# Patient Record
Sex: Female | Born: 1965 | Race: Black or African American | Hispanic: No | Marital: Married | Smoking: Never smoker
Health system: Southern US, Community
[De-identification: ages and names within clinical notes are randomized; demographics above are authoritative.]

## PROBLEM LIST (undated history)

## (undated) DIAGNOSIS — I10 Essential (primary) hypertension: Secondary | ICD-10-CM

## (undated) DIAGNOSIS — K219 Gastro-esophageal reflux disease without esophagitis: Secondary | ICD-10-CM

## (undated) DIAGNOSIS — N63 Unspecified lump in unspecified breast: Secondary | ICD-10-CM

## (undated) DIAGNOSIS — E119 Type 2 diabetes mellitus without complications: Secondary | ICD-10-CM

## (undated) DIAGNOSIS — IMO0002 Reserved for concepts with insufficient information to code with codable children: Secondary | ICD-10-CM

## (undated) DIAGNOSIS — R569 Unspecified convulsions: Secondary | ICD-10-CM

## (undated) DIAGNOSIS — R011 Cardiac murmur, unspecified: Secondary | ICD-10-CM

## (undated) HISTORY — DX: Essential (primary) hypertension: I10

## (undated) HISTORY — DX: Type 2 diabetes mellitus without complications: E11.9

## (undated) HISTORY — DX: Cardiac murmur, unspecified: R01.1

## (undated) HISTORY — DX: Unspecified lump in unspecified breast: N63.0

## (undated) HISTORY — DX: Reserved for concepts with insufficient information to code with codable children: IMO0002

## (undated) HISTORY — DX: Unspecified convulsions: R56.9

## (undated) HISTORY — DX: Gastro-esophageal reflux disease without esophagitis: K21.9

---
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
## 1991-07-08 DIAGNOSIS — I10 Essential (primary) hypertension: Secondary | ICD-10-CM

## 1991-07-08 HISTORY — DX: Essential (primary) hypertension: I10

## 1997-12-28 ENCOUNTER — Ambulatory Visit (HOSPITAL_COMMUNITY): Admission: RE | Admit: 1997-12-28 | Discharge: 1997-12-28 | Payer: Self-pay | Admitting: Urology

## 1999-09-03 ENCOUNTER — Other Ambulatory Visit: Admission: RE | Admit: 1999-09-03 | Discharge: 1999-09-03 | Payer: Self-pay | Admitting: Obstetrics and Gynecology

## 1999-10-09 ENCOUNTER — Ambulatory Visit (HOSPITAL_COMMUNITY): Admission: RE | Admit: 1999-10-09 | Discharge: 1999-10-09 | Payer: Self-pay | Admitting: Urology

## 2000-09-14 ENCOUNTER — Other Ambulatory Visit: Admission: RE | Admit: 2000-09-14 | Discharge: 2000-09-14 | Payer: Self-pay | Admitting: *Deleted

## 2001-01-13 ENCOUNTER — Encounter: Admission: RE | Admit: 2001-01-13 | Discharge: 2001-01-13 | Payer: Self-pay | Admitting: Obstetrics and Gynecology

## 2001-01-13 ENCOUNTER — Encounter: Payer: Self-pay | Admitting: Obstetrics and Gynecology

## 2001-01-13 ENCOUNTER — Other Ambulatory Visit: Admission: RE | Admit: 2001-01-13 | Discharge: 2001-01-13 | Payer: Self-pay | Admitting: Obstetrics and Gynecology

## 2001-02-03 ENCOUNTER — Encounter: Admission: RE | Admit: 2001-02-03 | Discharge: 2001-02-03 | Payer: Self-pay | Admitting: Obstetrics and Gynecology

## 2001-02-03 ENCOUNTER — Encounter: Payer: Self-pay | Admitting: Obstetrics and Gynecology

## 2001-02-04 ENCOUNTER — Encounter: Admission: RE | Admit: 2001-02-04 | Discharge: 2001-02-04 | Payer: Self-pay | Admitting: Family Medicine

## 2001-02-04 ENCOUNTER — Encounter: Payer: Self-pay | Admitting: Family Medicine

## 2001-11-25 ENCOUNTER — Other Ambulatory Visit: Admission: RE | Admit: 2001-11-25 | Discharge: 2001-11-25 | Payer: Self-pay | Admitting: Obstetrics and Gynecology

## 2001-11-30 ENCOUNTER — Ambulatory Visit (HOSPITAL_COMMUNITY): Admission: RE | Admit: 2001-11-30 | Discharge: 2001-11-30 | Payer: Self-pay | Admitting: *Deleted

## 2002-07-07 HISTORY — PX: ABDOMINAL HYSTERECTOMY: SHX81

## 2002-07-14 ENCOUNTER — Observation Stay (HOSPITAL_COMMUNITY): Admission: RE | Admit: 2002-07-14 | Discharge: 2002-07-15 | Payer: Self-pay | Admitting: Obstetrics and Gynecology

## 2002-07-18 ENCOUNTER — Encounter: Payer: Self-pay | Admitting: Emergency Medicine

## 2002-07-18 ENCOUNTER — Emergency Department (HOSPITAL_COMMUNITY): Admission: EM | Admit: 2002-07-18 | Discharge: 2002-07-18 | Payer: Self-pay | Admitting: Emergency Medicine

## 2002-07-22 ENCOUNTER — Inpatient Hospital Stay (HOSPITAL_COMMUNITY): Admission: AD | Admit: 2002-07-22 | Discharge: 2002-07-23 | Payer: Self-pay | Admitting: *Deleted

## 2002-08-06 ENCOUNTER — Inpatient Hospital Stay (HOSPITAL_COMMUNITY): Admission: AD | Admit: 2002-08-06 | Discharge: 2002-08-06 | Payer: Self-pay | Admitting: *Deleted

## 2002-08-06 ENCOUNTER — Encounter: Payer: Self-pay | Admitting: *Deleted

## 2002-09-20 ENCOUNTER — Encounter: Admission: RE | Admit: 2002-09-20 | Discharge: 2002-09-20 | Payer: Self-pay | Admitting: Obstetrics and Gynecology

## 2002-09-20 ENCOUNTER — Encounter: Payer: Self-pay | Admitting: Obstetrics and Gynecology

## 2002-11-03 ENCOUNTER — Emergency Department (HOSPITAL_COMMUNITY): Admission: EM | Admit: 2002-11-03 | Discharge: 2002-11-04 | Payer: Self-pay | Admitting: Emergency Medicine

## 2002-11-04 ENCOUNTER — Encounter: Payer: Self-pay | Admitting: Emergency Medicine

## 2003-08-08 ENCOUNTER — Encounter: Admission: RE | Admit: 2003-08-08 | Discharge: 2003-08-08 | Payer: Self-pay | Admitting: Family Medicine

## 2004-06-12 ENCOUNTER — Encounter: Admission: RE | Admit: 2004-06-12 | Discharge: 2004-06-12 | Payer: Self-pay | Admitting: Family Medicine

## 2004-08-20 ENCOUNTER — Ambulatory Visit: Payer: Self-pay

## 2006-03-17 ENCOUNTER — Ambulatory Visit: Payer: Self-pay

## 2007-10-12 ENCOUNTER — Ambulatory Visit: Payer: Self-pay | Admitting: Internal Medicine

## 2007-10-21 ENCOUNTER — Ambulatory Visit: Payer: Self-pay | Admitting: Urology

## 2008-08-30 ENCOUNTER — Ambulatory Visit: Payer: Self-pay | Admitting: Family Medicine

## 2009-04-10 ENCOUNTER — Encounter: Payer: Self-pay | Admitting: Urology

## 2009-05-07 ENCOUNTER — Encounter: Payer: Self-pay | Admitting: Urology

## 2009-06-06 ENCOUNTER — Encounter: Payer: Self-pay | Admitting: Urology

## 2009-07-24 ENCOUNTER — Encounter: Payer: Self-pay | Admitting: Obstetrics and Gynecology

## 2009-10-10 ENCOUNTER — Ambulatory Visit: Payer: Self-pay | Admitting: Family Medicine

## 2010-08-06 ENCOUNTER — Ambulatory Visit: Payer: Self-pay | Admitting: Neurology

## 2010-09-30 ENCOUNTER — Ambulatory Visit: Payer: Self-pay | Admitting: Gastroenterology

## 2010-10-02 LAB — PATHOLOGY REPORT

## 2010-10-28 ENCOUNTER — Ambulatory Visit: Payer: Self-pay | Admitting: Neurology

## 2011-04-01 ENCOUNTER — Ambulatory Visit: Payer: Self-pay | Admitting: Family Medicine

## 2011-07-08 HISTORY — PX: COLONOSCOPY: SHX174

## 2011-12-22 ENCOUNTER — Ambulatory Visit: Payer: Self-pay | Admitting: Family Medicine

## 2012-01-01 ENCOUNTER — Ambulatory Visit: Payer: Self-pay | Admitting: General Surgery

## 2012-01-01 LAB — POTASSIUM: Potassium: 3.2 mmol/L — ABNORMAL LOW (ref 3.5–5.1)

## 2012-01-05 ENCOUNTER — Ambulatory Visit: Payer: Self-pay | Admitting: General Surgery

## 2012-01-06 LAB — PATHOLOGY REPORT

## 2012-01-14 HISTORY — PX: BREAST SURGERY: SHX581

## 2012-07-07 HISTORY — PX: APPENDECTOMY: SHX54

## 2012-09-21 LAB — CBC
HCT: 40.3 % (ref 35.0–47.0)
MCH: 29.3 pg (ref 26.0–34.0)
Platelet: 343 10*3/uL (ref 150–440)
RBC: 4.63 10*6/uL (ref 3.80–5.20)
RDW: 14 % (ref 11.5–14.5)
WBC: 14.3 10*3/uL — ABNORMAL HIGH (ref 3.6–11.0)

## 2012-09-21 LAB — COMPREHENSIVE METABOLIC PANEL
Albumin: 3.5 g/dL (ref 3.4–5.0)
Alkaline Phosphatase: 84 U/L (ref 50–136)
BUN: 9 mg/dL (ref 7–18)
Calcium, Total: 8.5 mg/dL (ref 8.5–10.1)
Chloride: 102 mmol/L (ref 98–107)
Creatinine: 0.67 mg/dL (ref 0.60–1.30)
EGFR (African American): >60
EGFR (Non-African Amer.): >60
Glucose: 116 mg/dL — ABNORMAL HIGH (ref 65–99)
SGOT(AST): 19 U/L (ref 15–37)
SGPT (ALT): 28 U/L (ref 12–78)
Sodium: 137 mmol/L (ref 136–145)

## 2012-09-21 LAB — URINALYSIS, COMPLETE
Bacteria: NONE SEEN
Glucose,UR: NEGATIVE mg/dL (ref 0–75)
Ketone: NEGATIVE
Leukocyte Esterase: NEGATIVE
Protein: NEGATIVE
Specific Gravity: 1.013 (ref 1.003–1.030)
Squamous Epithelial: 2

## 2012-09-22 ENCOUNTER — Inpatient Hospital Stay: Payer: Self-pay | Admitting: Surgery

## 2012-09-23 LAB — CBC WITH DIFFERENTIAL/PLATELET
Eosinophil #: 0 10*3/uL (ref 0.0–0.7)
HCT: 34 % — ABNORMAL LOW (ref 35.0–47.0)
HGB: 11.3 g/dL — ABNORMAL LOW (ref 12.0–16.0)
MCH: 29.1 pg (ref 26.0–34.0)
MCHC: 33.3 g/dL (ref 32.0–36.0)
Monocyte #: 1.3 x10 3/mm — ABNORMAL HIGH (ref 0.2–0.9)
Neutrophil %: 70 %
Platelet: 319 10*3/uL (ref 150–440)
RBC: 3.89 10*6/uL (ref 3.80–5.20)

## 2012-09-23 LAB — BASIC METABOLIC PANEL
Anion Gap: 7 (ref 7–16)
BUN: 9 mg/dL (ref 7–18)
Calcium, Total: 8.2 mg/dL — ABNORMAL LOW (ref 8.5–10.1)
Chloride: 105 mmol/L (ref 98–107)
Co2: 28 mmol/L (ref 21–32)
EGFR (Non-African Amer.): >60
Glucose: 115 mg/dL — ABNORMAL HIGH (ref 65–99)
Osmolality: 279 (ref 275–301)

## 2012-09-23 LAB — PATHOLOGY REPORT

## 2012-09-25 LAB — CBC WITH DIFFERENTIAL/PLATELET
Basophil %: 1.2 %
HCT: 37.1 % (ref 35.0–47.0)
HGB: 12.2 g/dL (ref 12.0–16.0)
Lymphocyte %: 39.7 %
MCH: 28.4 pg (ref 26.0–34.0)
MCHC: 32.9 g/dL (ref 32.0–36.0)
Monocyte #: 0.6 x10 3/mm (ref 0.2–0.9)
Monocyte %: 6.2 %
Neutrophil #: 4.7 10*3/uL (ref 1.4–6.5)
Neutrophil %: 49.9 %
Platelet: 329 10*3/uL (ref 150–440)
RBC: 4.29 10*6/uL (ref 3.80–5.20)
WBC: 9.5 10*3/uL (ref 3.6–11.0)

## 2012-09-25 LAB — BASIC METABOLIC PANEL
Calcium, Total: 8.8 mg/dL (ref 8.5–10.1)
Chloride: 102 mmol/L (ref 98–107)
Creatinine: 0.54 mg/dL — ABNORMAL LOW (ref 0.60–1.30)
EGFR (African American): >60
EGFR (Non-African Amer.): >60
Glucose: 110 mg/dL — ABNORMAL HIGH (ref 65–99)
Potassium: 3.5 mmol/L (ref 3.5–5.1)

## 2012-10-05 ENCOUNTER — Other Ambulatory Visit: Payer: Self-pay | Admitting: Surgery

## 2012-10-05 LAB — CLOSTRIDIUM DIFFICILE BY PCR

## 2012-12-28 ENCOUNTER — Ambulatory Visit: Payer: Self-pay | Admitting: General Surgery

## 2012-12-30 ENCOUNTER — Encounter: Payer: Self-pay | Admitting: General Surgery

## 2013-01-06 ENCOUNTER — Encounter: Payer: Self-pay | Admitting: General Surgery

## 2013-01-06 ENCOUNTER — Ambulatory Visit (INDEPENDENT_AMBULATORY_CARE_PROVIDER_SITE_OTHER): Payer: BC Managed Care – PPO | Admitting: General Surgery

## 2013-01-06 VITALS — BP 140/78 | HR 74 | Resp 12 | Ht 64.5 in | Wt 198.0 lb

## 2013-01-06 DIAGNOSIS — Z87898 Personal history of other specified conditions: Secondary | ICD-10-CM | POA: Insufficient documentation

## 2013-01-06 DIAGNOSIS — N6459 Other signs and symptoms in breast: Secondary | ICD-10-CM

## 2013-01-06 DIAGNOSIS — Z803 Family history of malignant neoplasm of breast: Secondary | ICD-10-CM | POA: Insufficient documentation

## 2013-01-06 NOTE — Patient Instructions (Addendum)
Patient to return in 1 year with a bilateral screening mammogram. 

## 2013-01-06 NOTE — Progress Notes (Signed)
Patient ID: Kerry Webb, female   DOB: [birth year], 47 y.o.   MRN: [id number]  Chief Complaint  Patient presents with  . Follow-up    mammogram    HPI Kerry Webb is a 47 y.o. female who presents for a breast evaluation. The most recent mammogram was done on 12/28/12 with a birad category 2. Patient does perform regular self breast checks and gets regular mammograms done.  No problems with her breasts at this time. No left breast nipple discharge.   HPI  Past Medical History  Diagnosis Date  . Hypertension 1993  . Seizures 1983  . Ulcer 1985  . Heart murmur 1967  . Lump or mass in breast     Left  . GERD (gastroesophageal reflux disease)     Past Surgical History  Procedure Laterality Date  . Breast surgery Left 01/14/12  . Abdominal hysterectomy  2004  . Colonoscopy  2013    Dr. Bluford Kaufmann  . Appendectomy  2014    Family History  Problem Relation Age of Onset  . Cancer Maternal Grandmother     breast    Social History History  Substance Use Topics  . Smoking status: Never Smoker   . Smokeless tobacco: Never Used  . Alcohol Use: No    Allergies  Allergen Reactions  . Shellfish Allergy Shortness Of Breath  . Asa (Aspirin)     Intestinal bleeding    Current Outpatient Prescriptions  Medication Sig Dispense Refill  . AmLODIPine Besylate (NORVASC PO) Take 1 tablet by mouth daily.      . LamoTRIgine (LAMICTAL PO) Take 1 tablet by mouth daily.      . Omeprazole (PRILOSEC PO) Take 1 capsule by mouth daily.      Marland Kitchen POTASSIUM CHLORIDE PO Take 1 tablet by mouth daily.      . Sertraline HCl (ZOLOFT PO) Take 1 tablet by mouth daily.      . Telmisartan (MICARDIS PO) Take 1 tablet by mouth daily.       No current facility-administered medications for this visit.    Review of Systems Review of Systems  Constitutional: Negative.   Respiratory: Negative.   Cardiovascular: Negative.     Blood pressure 140/78, pulse 74, resp. rate 12, height 5' 4.5" (1.638 m),  weight 198 lb (89.812 kg).  Physical Exam Physical Exam  Constitutional: She is oriented to person, place, and time. She appears well-developed and well-nourished.  Neck: Trachea normal. No mass and no thyromegaly present.  Cardiovascular: Normal rate and regular rhythm.   Murmur heard.  Systolic murmur is present with a grade of 2/6  Murmur is unchanged from before.   Pulmonary/Chest: Effort normal and breath sounds normal. Right breast exhibits no inverted nipple, no mass, no nipple discharge, no skin change and no tenderness. Left breast exhibits no inverted nipple, no mass, no nipple discharge, no skin change and no tenderness. Breasts are symmetrical.  Lymphadenopathy:    She has no cervical adenopathy.    She has no axillary adenopathy.  Neurological: She is alert and oriented to person, place, and time.  Skin: Skin is warm and dry.    Data Reviewed  Mammogram reviewed.   Assessment    Nipple discharged resolved.      Plan    Routine yearly f/u        SANKAR,SEEPLAPUTHUR G 01/06/2013, 12:58 PM

## 2013-03-16 ENCOUNTER — Emergency Department: Payer: Self-pay | Admitting: Emergency Medicine

## 2013-03-16 LAB — COMPREHENSIVE METABOLIC PANEL
Albumin: 3.6 g/dL (ref 3.4–5.0)
Alkaline Phosphatase: 97 U/L (ref 50–136)
Anion Gap: 5 — ABNORMAL LOW (ref 7–16)
BUN: 14 mg/dL (ref 7–18)
Calcium, Total: 8.9 mg/dL (ref 8.5–10.1)
Chloride: 106 mmol/L (ref 98–107)
Co2: 26 mmol/L (ref 21–32)
Creatinine: 0.51 mg/dL — ABNORMAL LOW (ref 0.60–1.30)
EGFR (African American): >60
EGFR (Non-African Amer.): >60
Osmolality: 273 (ref 275–301)
Potassium: 4.7 mmol/L (ref 3.5–5.1)
SGOT(AST): 31 U/L (ref 15–37)
SGPT (ALT): 22 U/L (ref 12–78)
Total Protein: 7.5 g/dL (ref 6.4–8.2)

## 2013-03-16 LAB — CBC WITH DIFFERENTIAL/PLATELET
Basophil #: 0.1 10*3/uL (ref 0.0–0.1)
Eosinophil #: 0.2 10*3/uL (ref 0.0–0.7)
Eosinophil %: 1.8 %
HCT: 40 % (ref 35.0–47.0)
HGB: 13.6 g/dL (ref 12.0–16.0)
Lymphocyte #: 4.9 10*3/uL — ABNORMAL HIGH (ref 1.0–3.6)
MCV: 87 fL (ref 80–100)
Monocyte %: 6.4 %
Neutrophil %: 38.5 %
Platelet: 284 10*3/uL (ref 150–440)
RBC: 4.61 10*6/uL (ref 3.80–5.20)

## 2014-01-12 ENCOUNTER — Encounter: Payer: Self-pay | Admitting: General Surgery

## 2014-01-23 ENCOUNTER — Encounter: Payer: Self-pay | Admitting: General Surgery

## 2014-01-23 ENCOUNTER — Other Ambulatory Visit: Payer: BC Managed Care – PPO

## 2014-01-23 ENCOUNTER — Ambulatory Visit (INDEPENDENT_AMBULATORY_CARE_PROVIDER_SITE_OTHER): Payer: BC Managed Care – PPO | Admitting: General Surgery

## 2014-01-23 VITALS — BP 122/76 | HR 76 | Resp 12 | Ht 64.0 in | Wt 171.0 lb

## 2014-01-23 DIAGNOSIS — N63 Unspecified lump in unspecified breast: Secondary | ICD-10-CM

## 2014-01-23 DIAGNOSIS — Z803 Family history of malignant neoplasm of breast: Secondary | ICD-10-CM

## 2014-01-23 HISTORY — PX: BREAST BIOPSY: SHX20

## 2014-01-23 NOTE — Patient Instructions (Addendum)
Patient to return in one year bilateral screening mammogram.     CARE AFTER BREAST BIOPSY  1. Leave the dressing on that your doctor applied after surgery. It is waterproof. You may bathe, shower and/or swim. The dressing will probably remain intact until your return office visit. If the dressing comes off, you will see small strips of tape against your skin on the incision. Do not remove these strips.  2. You may want to use a gauze,cloth or similar protection in your bra to prevent rubbing against your dressing and incision. This is not necessary, but you may feel more comfortable doing so.  3. It is recommended that you wear a bra day and night to give support to the breast. This will prevent the weight of the breast from pulling on the incision.  4. Your breast will feel hard and lumpy under the incision. Do not be alarmed. This is the underlying stitching of tissue. Softening of this tissue will occur in time.  5. Make sure you call the office and schedule an appointment in one week after your surgery. The office phone number is [phone]. [facility] may have already done this for you.  6. You will notice about a week after your office visit that the strips of the tape on your incision will begin to loosen. These may then be removed.  7. Report to your doctor any of the following:  * Severe pain not relieved by your pain medication  *Redness of the incision  * Drainage from the incision  *Fever greater than 101 degrees

## 2014-01-23 NOTE — Progress Notes (Signed)
Patient ID: Kerry Webb, female   DOB: 05-17-1966, 48 y.o.   MRN: [id number]  Chief Complaint  Patient presents with  . Follow-up    mammogram    HPI Kerry Webb is a 48 y.o. female who presents for a breast evaluation. The most recent mammogram was done on 01/11/14. No nipple discharge.  Patient does perform regular self breast checks and gets regular mammograms done.    HPI  Past Medical History  Diagnosis Date  . Hypertension 1993  . Seizures 1983  . Ulcer 1985  . Heart murmur 1967  . Lump or mass in breast     Left  . GERD (gastroesophageal reflux disease)     Past Surgical History  Procedure Laterality Date  . Breast surgery Left 01/14/12  . Abdominal hysterectomy  2004  . Colonoscopy  2013    Dr. Candace Cruise  . Appendectomy  2014    Family History  Problem Relation Age of Onset  . Cancer Maternal Grandmother     breast    Social History History  Substance Use Topics  . Smoking status: Never Smoker   . Smokeless tobacco: Never Used  . Alcohol Use: No    Allergies  Allergen Reactions  . Shellfish Allergy Shortness Of Breath  . Asa [Aspirin]     Intestinal bleeding    Current Outpatient Prescriptions  Medication Sig Dispense Refill  . AmLODIPine Besylate (NORVASC PO) Take 1 tablet by mouth daily.      . LamoTRIgine (LAMICTAL PO) Take 1 tablet by mouth daily.      Marland Kitchen losartan-hydrochlorothiazide (HYZAAR) 100-25 MG per tablet TAKE ONE TABLET BY MOUTH EVERY DAY      . Omeprazole (PRILOSEC PO) Take 1 capsule by mouth daily.      Marland Kitchen POTASSIUM CHLORIDE PO Take 1 tablet by mouth daily.      . potassium chloride SA (K-DUR,KLOR-CON) 20 MEQ tablet TAKE ONE TABLET BY MOUTH TWICE DAILY      . Sertraline HCl (ZOLOFT PO) Take 1 tablet by mouth daily.      . Telmisartan (MICARDIS PO) Take 1 tablet by mouth daily.       No current facility-administered medications for this visit.    Review of Systems Review of Systems  Constitutional: Negative.   Respiratory:  Negative.   Cardiovascular: Negative.     Blood pressure 122/76, pulse 76, resp. rate 12, height 5\' 4"  (1.626 m), weight 171 lb (77.565 kg).  Physical Exam Physical Exam  Constitutional: She is oriented to person, place, and time. She appears well-developed and well-nourished.  Eyes: Conjunctivae are normal. No scleral icterus.  Neck: Neck supple.  Cardiovascular: Regular rhythm.   Murmur heard.  Systolic murmur is present with a grade of 2/6  Pulmonary/Chest: Effort normal and breath sounds normal. Right breast exhibits no inverted nipple, no mass, no nipple discharge, no skin change and no tenderness. Left breast exhibits no inverted nipple, no mass, no nipple discharge, no skin change and no tenderness.  1 cm thickening in the right breast above the areolar at 12 o'clock  Abdominal: Soft. Bowel sounds are normal. There is no tenderness.  Lymphadenopathy:    She has no cervical adenopathy.    She has no axillary adenopathy.  Neurological: She is alert and oriented to person, place, and time.  Skin: Skin is warm and dry.    Data Reviewed Mammogram reviewed-no findings  Assessment    Performed right breast ultrasound Hypoechoic area with no other features  over palpable mass right breast.     Plan   Core biopsy of right breast mass discussed and completed today with pt consent Patient to return in 3 mos if pathology is benign.        Derell Bruun G 01/24/2014, 3:03 PM

## 2014-01-24 ENCOUNTER — Encounter: Payer: Self-pay | Admitting: General Surgery

## 2014-01-26 ENCOUNTER — Telehealth: Payer: Self-pay | Admitting: *Deleted

## 2014-01-26 LAB — PATHOLOGY

## 2014-01-26 NOTE — Telephone Encounter (Signed)
Notified patient as instructed, benign breast biopsy per Dr. Jamal Collin, patient pleased. Discussed follow-up appointments, patient agrees.

## 2014-03-10 ENCOUNTER — Telehealth: Payer: Self-pay | Admitting: General Surgery

## 2014-03-16 NOTE — Telephone Encounter (Signed)
ERROR

## 2014-04-26 ENCOUNTER — Ambulatory Visit: Payer: BC Managed Care – PPO | Admitting: General Surgery

## 2014-05-08 ENCOUNTER — Encounter: Payer: Self-pay | Admitting: General Surgery

## 2014-05-09 ENCOUNTER — Other Ambulatory Visit: Payer: BC Managed Care – PPO

## 2014-05-09 ENCOUNTER — Encounter: Payer: Self-pay | Admitting: General Surgery

## 2014-05-09 ENCOUNTER — Ambulatory Visit (INDEPENDENT_AMBULATORY_CARE_PROVIDER_SITE_OTHER): Payer: BC Managed Care – PPO | Admitting: General Surgery

## 2014-05-09 VITALS — BP 124/72 | HR 70 | Resp 12 | Ht 64.0 in | Wt 164.0 lb

## 2014-05-09 DIAGNOSIS — N6310 Unspecified lump in the right breast, unspecified quadrant: Secondary | ICD-10-CM

## 2014-05-09 DIAGNOSIS — N63 Unspecified lump in breast: Secondary | ICD-10-CM

## 2014-05-09 NOTE — Patient Instructions (Addendum)
Continue self breast exams. Call office for any new breast issues or concerns.  Patient is scheduled for surgery at [facility] on 05/16/14. She will pre admit by phone on 05/10/14. She is aware of dates and instructions.

## 2014-05-09 NOTE — Progress Notes (Signed)
Patient ID: Kerry Webb, female   DOB: May 14, 1966, 48 y.o.   MRN: [id number]  Chief Complaint  Patient presents with  . Follow-up    HPI Kerry Webb is a 48 y.o. female.  Here today for follow up right breast mass and right breast ultrasound. Right breast core biopsy was benign on 01-23-14. No new breast issues.  HPI  Past Medical History  Diagnosis Date  . Hypertension 1993  . Seizures 1983  . Ulcer 1985  . Heart murmur 1967  . Lump or mass in breast     Left  . GERD (gastroesophageal reflux disease)     Past Surgical History  Procedure Laterality Date  . Abdominal hysterectomy  2004  . Colonoscopy  2013    Dr. Candace Cruise  . Appendectomy  2014  . Breast surgery Left 01/14/12  . Breast biopsy Right 01-23-14    benign    Family History  Problem Relation Age of Onset  . Cancer Maternal Grandmother     breast    Social History History  Substance Use Topics  . Smoking status: Never Smoker   . Smokeless tobacco: Never Used  . Alcohol Use: No    Allergies  Allergen Reactions  . Shellfish Allergy Shortness Of Breath  . Asa [Aspirin]     Intestinal bleeding    Current Outpatient Prescriptions  Medication Sig Dispense Refill  . AmLODIPine Besylate (NORVASC PO) Take 1 tablet by mouth daily.    . LamoTRIgine (LAMICTAL PO) Take 1 tablet by mouth daily.    Marland Kitchen losartan-hydrochlorothiazide (HYZAAR) 100-25 MG per tablet TAKE ONE TABLET BY MOUTH EVERY DAY    . Omeprazole (PRILOSEC PO) Take 1 capsule by mouth daily.    . potassium chloride SA (K-DUR,KLOR-CON) 20 MEQ tablet TAKE ONE TABLET BY MOUTH TWICE DAILY    . Sertraline HCl (ZOLOFT PO) Take 1 tablet by mouth daily.    . Telmisartan (MICARDIS PO) Take 1 tablet by mouth daily.     No current facility-administered medications for this visit.    Review of Systems Review of Systems  Constitutional: Negative.   Respiratory: Negative.   Cardiovascular: Negative.     Blood pressure 124/72, pulse 70, resp. rate  12, height 5\' 4"  (1.626 m), weight 164 lb (74.39 kg).  Physical Exam Physical Exam  Constitutional: She is oriented to person, place, and time. She appears well-developed and well-nourished.  Eyes: Conjunctivae are normal. No scleral icterus.  Neck: Neck supple.  Cardiovascular: Normal rate and regular rhythm.   Murmur heard.  Systolic murmur is present with a grade of 2/6  Pulmonary/Chest: Effort normal and breath sounds normal. Right breast exhibits no inverted nipple, no mass, no nipple discharge, no skin change and no tenderness. Left breast exhibits no inverted nipple, no mass, no nipple discharge, no skin change and no tenderness.  Palpable thickening above areolar right breast.  Lymphadenopathy:    She has no cervical adenopathy.    She has no axillary adenopathy.  Neurological: She is alert and oriented to person, place, and time.  Skin: Skin is warm and dry.    Data Reviewed Prior office [place]  Assessment    Right breast ultrasound showed basically the same size mass above right areola. Prior path only showed benign breast tissue. This is considered a discordant diagnosis. Discussed options of excision vs continued observation. Pt wishes to go ahead with excision. Procedure explained to pt.    Plan    Schedule excision right breast mass  in SDS     Patient is scheduled for surgery at [facility] on 05/16/14. She will pre admit by phone on 05/10/14. She is aware of dates and instructions.    Nevaan Bunton G 05/09/2014, 8:11 PM

## 2014-05-11 ENCOUNTER — Ambulatory Visit: Payer: Self-pay | Admitting: Unknown Physician Specialty

## 2014-05-11 LAB — POTASSIUM: POTASSIUM: 3.1 mmol/L — AB (ref 3.5–5.1)

## 2014-05-15 ENCOUNTER — Telehealth: Payer: Self-pay | Admitting: *Deleted

## 2014-05-15 NOTE — Telephone Encounter (Signed)
Patient notified as instructed and she verbalizes understanding.

## 2014-05-15 NOTE — Telephone Encounter (Signed)
Per the Pre-admission Department, patient's potassium level was low on 05-11-14. She is scheduled for surgery at [facility] on 05-16-14. Message left for patient to call the office.  Patient already takes K-Due 20 mEq bid at home. She will need to take 3 times today and then once at 6 am tomorrow morning.

## 2014-05-16 ENCOUNTER — Encounter: Payer: Self-pay | Admitting: General Surgery

## 2014-05-16 ENCOUNTER — Ambulatory Visit: Payer: Self-pay | Admitting: General Surgery

## 2014-05-16 DIAGNOSIS — N63 Unspecified lump in breast: Secondary | ICD-10-CM

## 2014-05-16 HISTORY — PX: BREAST SURGERY: SHX581

## 2014-05-18 ENCOUNTER — Encounter: Payer: Self-pay | Admitting: General Surgery

## 2014-05-18 ENCOUNTER — Telehealth: Payer: Self-pay | Admitting: *Deleted

## 2014-05-18 NOTE — Telephone Encounter (Signed)
-----   Message from Christene Lye, MD sent at 05/18/2014  2:03 PM EST ----- Please let pt pt know the pathology was normal.

## 2014-05-18 NOTE — Telephone Encounter (Signed)
Notified patient as instructed, patient pleased. Discussed follow-up appointments, patient agrees  

## 2014-05-25 ENCOUNTER — Ambulatory Visit (INDEPENDENT_AMBULATORY_CARE_PROVIDER_SITE_OTHER): Payer: Self-pay | Admitting: General Surgery

## 2014-05-25 ENCOUNTER — Encounter: Payer: Self-pay | Admitting: General Surgery

## 2014-05-25 VITALS — BP 110/82 | HR 80 | Resp 12 | Ht 65.0 in | Wt 167.0 lb

## 2014-05-25 DIAGNOSIS — D241 Benign neoplasm of right breast: Secondary | ICD-10-CM

## 2014-05-25 NOTE — Progress Notes (Signed)
Here today for her postoperative visit, right breast mass excision on 05-16-14, benign. She states she is doing well. Pathology was benign. Incision healing well. Follow up 8 months bilateral screening mammogram and office visit.

## 2014-05-25 NOTE — Patient Instructions (Addendum)
Continue self breast exams. Call office for any new breast issues or concerns. Follow up 8 months bilateral screening mammogram and office visit.

## 2014-05-26 ENCOUNTER — Encounter: Payer: Self-pay | Admitting: General Surgery

## 2014-10-27 NOTE — H&P (Signed)
   Subjective/Chief Complaint Abdominal pain   History of Present Illness Kerry Webb is a pleasant 49 yo F with a history of endometriosis and hysterectomy who presents with 3 weeks of intermittent abdominal pain.  Says that it began suddenly 3 weeks ago.  Diffuse, nonfocal.  Associated with bloating.  + diarrhea initially.  Thought to be colitis and treated with antibiotics.  Improved initially but no longer.  + nausea.  Felt like "her stomach was sour".  Unsure whether her pain is worse with movement or if still.   Past History HTN Seizure D/o Endometriosis s/p laparoscopy Fibroids s/p hysterectomy H/o breast duct removal for bloody discharge   Past Med/Surgical Hx:  Seizure disorder: Last 2011  Migraine Headaches:   Hypertension:   Asthma Exercise Induced:   Hysterectomy:   ALLERGIES:  Shellfish: Other, Swelling  Aspirin: Other  NSAIDS: Other  Family and Social History:  Family History Hypertension   Social History negative tobacco, negative ETOH, negative Illicit drugs   Place of Living Home  Here with her husband, who is a Engineer, structural   Review of Systems:  Subjective/Chief Complaint Abdominal pain   Fever/Chills No   Cough No   Sputum No   Abdominal Pain Yes   Diarrhea Yes   Constipation No   Nausea/Vomiting Yes   SOB/DOE No   Dysuria No   Physical Exam:  GEN well developed, well nourished, no acute distress, Appears uncomfortable   HEENT pink conjunctivae, PERRL, hearing intact to voice   RESP normal resp effort  clear BS  no use of accessory muscles   CARD regular rate  no murmur  no thrills  No LE edema  no JVD  no Rub   ABD positive tenderness  no hernia  soft  distended  normal BS   EXTR negative cyanosis/clubbing, negative edema   SKIN normal to palpation, No rashes, No ulcers   NEURO cranial nerves intact, negative rigidity, negative tremor   PSYCH alert, A+O to time, place, person, good insight    Assessment/Admission Diagnosis  Ms. [name] is a pleasant 49 yo F with 3 weeks of nonresolving abdominal pain.  + WBC 14.  Tachycardic.  Tender to palpation.  CT shows dilated appendix with some periappendiceal stranding/fecoliths.  Unusual presentation for appendicitis, consider perforated.  Also in differential is diverticulitis vs endometriosis.   Plan Will admit for resuscitation, abx, correction of electrolytes.  Will hand off care to Dr. Pat Patrick in am, who will likely proceed with appendectomy vs CT with contrast.   Electronic Signatures: Floyde Parkins (MD)  (Signed 19-Mar-14 04:22)  Authored: CHIEF COMPLAINT and HISTORY, PAST MEDICAL/SURGIAL HISTORY, ALLERGIES, FAMILY AND SOCIAL HISTORY, REVIEW OF SYSTEMS, PHYSICAL EXAM, ASSESSMENT AND PLAN   Last Updated: 19-Mar-14 04:22 by Floyde Parkins (MD)

## 2014-10-27 NOTE — Op Note (Signed)
PATIENT NAME:  Kerry Webb, Kerry Webb MR#:  [id number] DATE OF BIRTH:  Aug 04, 1965  DATE OF PROCEDURE:  09/22/2012  PREOPERATIVE DIAGNOSIS: Acute appendicitis.   POSTOPERATIVE DIAGNOSIS: Acute appendicitis.   OPERATION: Laparoscopic appendectomy.   ANESTHESIA:  General.  SURGEON: Rodena Goldmann, M.D.   ASSISTANTAnastasia Fiedler, [place] student.   OPERATIVE PROCEDURE: With the patient in the supine position after the induction of appropriate general anesthesia, the patient's abdomen was prepped with ChloraPrep and draped with sterile towels. The patient was placed in the head-down, feet-up position. A small infraumbilical incision was made in the standard fashion, carried down bluntly through the subcutaneous tissue. The Veress needle was used to cannulate the peritoneal cavity. CO2 was insufflated to appropriate pressure measurements. When approximately 10.5 liters of CO2 were instilled, the Veress needle was withdrawn. An 11 mm [employer] port was inserted into the peritoneal cavity. Intraperitoneal position was confirmed. CO2 was reinsufflated. A mid-epigastric transverse incision was made and an 11 mm port was inserted under direct vision. The right lower quadrant was investigated and acute appendicitis without rupture was identified. There was significant fibrinous exudate over the appendix, and the appendix appeared to be quite dilated. A suprapubic transverse incision was made and a 12 mm port inserted under direct vision. The camera was moved to the upper port and dissection carried out through the 2 lower ports. The mesoappendix was divided with 2 applications of the Endo GIA stapler carrying a white load, and the base of the appendix was amputated with a portion of the cecum using  2 applications of the Endo GIA stapler carrying a blue load. The appendix was captured in an EndoCatch apparatus and removed without difficulty through the suprapubic incision. The area was copiously irrigated with warm saline  solution. The abdomen was then suctioned without difficulty. The lower midline incision was closed with figure-of-eight suture of 0 Vicryl using the suture passer. Skin closed with 5-0 nylon after desufflation and the area infiltrated with 0.25% Marcaine for postoperative pain control. Sterile dressings were applied. The patient was returned to the recovery room having tolerated the procedure well. Sponge, instrument, and needle counts were correct x 2 in the operating room.    ____________________________ Rodena Goldmann III, MD rle:dm D: 09/22/2012 11:01:03 ET T: 09/22/2012 11:08:23 ET JOB#: [id number]  cc: Micheline Maze, MD, <Dictator> Youlanda Roys. Lovie Macadamia, MD  Rodena Goldmann MD ELECTRONICALLY SIGNED 09/23/2012 10:41

## 2014-10-27 NOTE — Discharge Summary (Signed)
PATIENT NAME:  Kerry Webb, ROCHETTE MR#:  [id number] DATE OF BIRTH:  [birth year]  DATE OF ADMISSION:  09/24/2012 DATE OF DISCHARGE:  09/26/2012  BRIEF HISTORY: This patient is a 49 year old woman admitted through the Emergency Room with a 3-week history of abdominal pain. Workup in the Emergency Room revealed a slightly elevated white blood cell count, but CT scan demonstrated a large, distended, thickened appendix with a large fecalith and superior appendiceal inflammatory changes. The working diagnosis was acute appendicitis. Because of the severity of her symptoms, we recommended a laparoscopy. She was taken to on the OR on the morning of 09/23/2012 where she underwent a laparoscopy. At that time, she was noted to have an acutely inflamed appendix. A laparoscopic appendectomy was performed. She was maintained on antibiotic therapy. She was started on a liquid diet and slowly improved over the first 24 to 48 hours. She has had bowel function with no evidence of any significant wound problems. She had a little nausea on the first hospital day, which prompted a repeat evaluation. No significant elevation of white blood cell count was noted, and her exam was unremarkable. She is discharged home today, to be followed in the office in 7 to 10 days' time. Bathing, activity, and driving instructions were given to the patient.  She is to take Vicodin for pain.    DISCHARGE MEDICATIONS: Include: Omeprazole 20 mg p.o. daily, lamotrigine 100 mg b.i.d., sertraline 100 mg once a day, losartan 100 mg once a day, amlodipine 10 mg once a day, and hydrochlorothiazide 25 mg p.o. once a day.   FINAL DISCHARGE DIAGNOSIS: Acute appendicitis.   SURGERY: Laparoscopic appendectomy.     ____________________________ Rodena Goldmann III, MD rle:dm D: 09/26/2012 11:06:00 ET T: 09/26/2012 11:21:26 ET JOB#: [id number]  cc: Youlanda Roys. Lovie Macadamia, MD Rodena Goldmann III, MD, <Dictator>   Rodena Goldmann MD ELECTRONICALLY SIGNED 09/26/2012  17:33

## 2014-10-28 NOTE — Op Note (Signed)
PATIENT NAME:  Kerry Webb, Kerry Webb MR#:  [id number] DATE OF BIRTH:  06-14-1966  DATE OF PROCEDURE:  05/16/2014  PREOPERATIVE DIAGNOSIS: Right breast mass.   POSTOPERATIVE DIAGNOSIS: Right breast mass.   OPERATION: Excision right breast mass with ultrasound guidance and wire localization.   SURGEON: S.G. Jamal Collin, MD.   ANESTHESIA: General.   COMPLICATIONS: None.   ESTIMATED BLOOD LOSS: Minimal.   DRAINS: None.   DESCRIPTION OF PROCEDURE: The patient was placed in the supine position on the operating table and put to sleep with an LMA. The right breast was prepped and draped out as a sterile field. Timeout was performed. Ultrasound probe was brought up to the field. The slightly thickened area just above the areola around the 11 to 12 o'clock position was queried and showed an ill-defined mass at this site. A small stab incision was made lateral to the probe and a Bard UltraWire was positioned going through the mass and anchored in place. Incision was then made along the superior aspect of the breast extending from the wire entrance and corresponding to the areolar margin. The skin and subcutaneous tissue was freed and using the wire as a guide and by palpation, the firm ill-defined mass was excised out in full. Grossly it appeared to be benign and likely fibrosis. There was no suggestion of a neoplastic process on gross inspection. This excised tissue was sent to pathology for routine processing. Hemostasis was obtained with cautery and the deeper layers closed with interrupted 2-0 Vicryl and the skin closed with subcuticular 4-0 Vicryl, covered with Dermabond. The procedure was well tolerated. She was subsequently extubated and returned to the recovery room in stable condition.    ____________________________ S.Robinette Haines, MD sgs:at D: 05/16/2014 10:34:16 ET T: 05/16/2014 11:30:39 ET JOB#: [id number]  cc: S.G. Jamal Collin, MD, <Dictator> [facility] Robinette Haines MD ELECTRONICALLY SIGNED 05/18/2014  10:23

## 2014-10-29 NOTE — Op Note (Signed)
PATIENT NAME:  Kerry Webb, VAHLE MR#:  [id number] DATE OF BIRTH:  1965-12-02  DATE OF PROCEDURE:  01/05/2012  PREOPERATIVE DIAGNOSIS: Left nipple bloody discharge.   POSTOPERATIVE DIAGNOSIS: Left nipple bloody discharge.   OPERATION: Excision of subareolar duct upper outer quadrant left breast.   SURGEON: Mckinley Jewel, M.D.   ANESTHESIA: General.   COMPLICATIONS: None.   ESTIMATED BLOOD LOSS: Minimal.   DRAINS: None.   DESCRIPTION OF PROCEDURE: The patient was put to sleep with LMA. The left breast was prepped and draped out as a sterile field. Ultrasound guidance was used. The probe with a sterile cover was brought up to the field. The area of the mildly dilated ducts located in the upper outer  quadrant was identified and we mapped the incision to be along the areolar margin from the  12 o'clock to the 3 to 4 o'clock position Accordingly, this area was infiltrated with 0.5% Marcaine. A skin incision was made along the areolar margin and deepened through to reveal several ducts actually containing some milky thick material with some firmness to this tissue. The skin was elevated all the way to the nipple area and all ductal tissue in the upper outer quadrant was then excised out and sent to pathology. The deeper tissue was approximated with interrupted 2-0 Vicryl and the skin was closed with subcuticular 4-0 Vicryl and covered with Dermabond. The procedure was well tolerated. She was subsequently returned to the recovery room in stable condition.   ____________________________ S.Robinette Haines, MD sgs:bjt D:  01/05/2012 13:49:35 ET          T: 01/05/2012 14:22:31 ET         JOB#: [id number]  [id number] Robinette Haines MD ELECTRONICALLY SIGNED 01/05/2012 14:45

## 2014-10-30 LAB — SURGICAL PATHOLOGY

## 2015-01-29 ENCOUNTER — Encounter: Payer: Self-pay | Admitting: General Surgery

## 2015-02-01 ENCOUNTER — Ambulatory Visit (INDEPENDENT_AMBULATORY_CARE_PROVIDER_SITE_OTHER): Payer: BC Managed Care – PPO | Admitting: General Surgery

## 2015-02-01 ENCOUNTER — Encounter: Payer: Self-pay | Admitting: General Surgery

## 2015-02-01 VITALS — BP 118/74 | HR 78 | Resp 12 | Ht 65.0 in | Wt 169.0 lb

## 2015-02-01 DIAGNOSIS — Z803 Family history of malignant neoplasm of breast: Secondary | ICD-10-CM

## 2015-02-01 NOTE — Progress Notes (Signed)
Patient ID: Kerry Webb, female   DOB: 18-Jul-1965, 49 y.o.   MRN: [id number]  Chief Complaint  Patient presents with  . Follow-up    mammogram    HPI Kerry Webb is a 49 y.o. female who presents for a breast evaluation. The most recent mammogram was done on 01/25/15.  Patient does perform regular self breast checks and gets regular mammograms done.    HPI  Past Medical History  Diagnosis Date  . Hypertension 1993  . Seizures 1983  . Ulcer 1985  . Heart murmur 1967  . Lump or mass in breast     Left  . GERD (gastroesophageal reflux disease)     Past Surgical History  Procedure Laterality Date  . Abdominal hysterectomy  2004  . Colonoscopy  2013    Dr. Candace Cruise  . Appendectomy  2014  . Breast surgery Left 01/14/12  . Breast biopsy Right 01-23-14    benign  . Breast surgery Right 05-16-14    benign    Family History  Problem Relation Age of Onset  . Cancer Maternal Grandmother     breast    Social History History  Substance Use Topics  . Smoking status: Never Smoker   . Smokeless tobacco: Never Used  . Alcohol Use: No    Allergies  Allergen Reactions  . Shellfish Allergy Shortness Of Breath  . Asa [Aspirin]     Intestinal bleeding    Current Outpatient Prescriptions  Medication Sig Dispense Refill  . AmLODIPine Besylate (NORVASC PO) Take 1 tablet by mouth daily.    . LamoTRIgine (LAMICTAL PO) Take 1 tablet by mouth daily.    Marland Kitchen losartan-hydrochlorothiazide (HYZAAR) 100-25 MG per tablet TAKE ONE TABLET BY MOUTH EVERY DAY    . Omeprazole (PRILOSEC PO) Take 1 capsule by mouth daily.    Marland Kitchen omeprazole (PRILOSEC) 20 MG capsule Take 20 mg by mouth 2 (two) times daily before a meal.     . potassium chloride SA (K-DUR,KLOR-CON) 20 MEQ tablet 20 mEq 4 (four) times daily. TAKE ONE TABLET BY MOUTH TWICE DAILY    . Sertraline HCl (ZOLOFT PO) Take 1 tablet by mouth daily.    . Telmisartan (MICARDIS PO) Take 1 tablet by mouth daily.     No current  facility-administered medications for this visit.    Review of Systems Review of Systems  Constitutional: Negative.   Respiratory: Negative.   Cardiovascular: Negative.     Blood pressure 118/74, pulse 78, resp. rate 12, height 5\' 5"  (1.651 m), weight 169 lb (76.658 kg).  Physical Exam Physical Exam  Constitutional: She is oriented to person, place, and time. She appears well-developed and well-nourished.  Eyes: Conjunctivae are normal. No scleral icterus.  Neck: Neck supple.  Cardiovascular: Normal rate, regular rhythm and normal heart sounds.   Pulmonary/Chest: Effort normal and breath sounds normal. Right breast exhibits no inverted nipple, no mass, no nipple discharge, no skin change and no tenderness. Left breast exhibits no inverted nipple, no mass, no nipple discharge, no skin change and no tenderness.  Abdominal: Soft. Bowel sounds are normal. There is no tenderness.  Lymphadenopathy:    She has no cervical adenopathy.    She has no axillary adenopathy.  Neurological: She is alert and oriented to person, place, and time.  Skin: Skin is warm and dry.    Data Reviewed  Mammogram reviewed and stable  Assessment   1year  post right breast mass excision on 05-16-14, benign FCD Family history of  breast cancer-maternal GM     Plan        The patient has been asked to return to the office in one year with a bilateral screening mammogram. Continue self breast exams. Call office for any new breast issues or concerns.   PCP:  Maretta Bees 02/02/2015, 8:03 AM

## 2015-02-01 NOTE — Patient Instructions (Addendum)
he patient has been asked to return to the office in one year with a bilateral screening mammogram. Continue self breast exams. Call office for any new breast issues or concerns.  

## 2015-02-02 ENCOUNTER — Encounter: Payer: Self-pay | Admitting: General Surgery

## 2016-01-29 ENCOUNTER — Encounter: Payer: Self-pay | Admitting: General Surgery

## 2016-01-31 ENCOUNTER — Encounter: Payer: Self-pay | Admitting: *Deleted

## 2016-02-07 ENCOUNTER — Encounter: Payer: Self-pay | Admitting: General Surgery

## 2016-02-07 ENCOUNTER — Ambulatory Visit (INDEPENDENT_AMBULATORY_CARE_PROVIDER_SITE_OTHER): Payer: BC Managed Care – PPO | Admitting: General Surgery

## 2016-02-07 VITALS — BP 138/88 | HR 78 | Resp 12 | Ht 65.0 in | Wt 173.0 lb

## 2016-02-07 DIAGNOSIS — Z803 Family history of malignant neoplasm of breast: Secondary | ICD-10-CM | POA: Diagnosis not present

## 2016-02-07 DIAGNOSIS — D241 Benign neoplasm of right breast: Secondary | ICD-10-CM | POA: Diagnosis not present

## 2016-02-07 NOTE — Patient Instructions (Addendum)
The patient is aware to call back for any questions or concerns. Patient will be asked to return to the office in one year with a bilateral screening mammogram. 

## 2016-02-07 NOTE — Progress Notes (Signed)
Kerry Webb ID: Kerry Webb, female   DOB: [birth year], 50 y.o.   MRN: [id number]  Chief Complaint  Kerry Webb presents with  . Follow-up    mamogram    HPI Kerry Webb is a 50 y.o. female who presents for a breast evaluation. The most recent mammogram was done on 01-28-16. Kerry Webb does perform regular self breast checks and gets regular mammograms done. No new breast issues. Kerry Webb recently diagnosed with liver cancer, she lives in [place] and her step father has dementia. Kerry Webb does not have a clear idea of what the medical condition of her Kerry Webb is.  I have reviewed the history of present illness with the Kerry Webb.   HPI  Past Medical History:  Diagnosis Date  . GERD (gastroesophageal reflux disease)   . Heart murmur 1967  . Hypertension 1993  . Lump or mass in breast    Left  . Seizures ([place]) 1983  . Ulcer 1985    Past Surgical History:  Procedure Laterality Date  . ABDOMINAL HYSTERECTOMY  2004  . APPENDECTOMY  2014  . BREAST BIOPSY Right 01-23-14   benign  . BREAST SURGERY Left 01/14/12  . BREAST SURGERY Right 05-16-14   benign  . COLONOSCOPY  2013   Dr. Candace Cruise    Family History  Problem Relation Age of Onset  . Cancer Maternal Grandmother     breast  . Liver cancer Kerry Webb     Social History Social History  Substance Use Topics  . Smoking status: Never Smoker  . Smokeless tobacco: Never Used  . Alcohol use No    Allergies  Allergen Reactions  . Shellfish Allergy Shortness Of Breath  . Asa [Aspirin]     Intestinal bleeding    Current Outpatient Prescriptions  Medication Sig Dispense Refill  . AmLODIPine Besylate (NORVASC PO) Take 1 tablet by mouth daily.    . Omega-3 Fatty Acids (FISH OIL) 1000 MG CAPS Take by mouth.    . Omeprazole (PRILOSEC PO) Take 1 capsule by mouth daily.    . potassium chloride SA (K-DUR,KLOR-CON) 20 MEQ tablet 20 mEq 4 (four) times daily. TAKE ONE TABLET BY MOUTH TWICE DAILY    . Sertraline HCl (ZOLOFT PO) Take 1 tablet by  mouth daily.    Kerry Webb topiramate (TOPAMAX) 100 MG tablet Take 100 mg by mouth 2 (two) times daily.     No current facility-administered medications for this visit.     Review of Systems Review of Systems  Constitutional: Negative.   Respiratory: Negative.   Cardiovascular: Negative.     Blood pressure 138/88, pulse 78, resp. rate 12, height 5\' 5"  (1.651 m), weight 173 lb (78.5 kg).  Physical Exam Physical Exam  Constitutional: She is oriented to person, place, and time. She appears well-developed and well-nourished.  Eyes: Conjunctivae are normal. No scleral icterus.  Neck: Neck supple.  Cardiovascular: Normal rate, regular rhythm and normal heart sounds.   Pulmonary/Chest: Effort normal and breath sounds normal. Right breast exhibits no inverted nipple, no mass, no nipple discharge, no skin change and no tenderness. Left breast exhibits no inverted nipple, no mass, no nipple discharge, no skin change and no tenderness.  Abdominal: Soft. Bowel sounds are normal. There is no tenderness.  Lymphadenopathy:    She has no cervical adenopathy.    She has no axillary adenopathy.  Neurological: She is alert and oriented to person, place, and time.  Skin: Skin is warm and dry.  Psychiatric: Her behavior is normal.    Data  Reviewed Prior notes, mammogram reviewed   Assessment   2 year  post right breast mass excision on 05-16-14, benign FCD Family history of breast cancer-maternal GM       Plan    Kerry Webb will be asked to return to the office in one year with a bilateral screening mammogram.    This information has been scribed by Karie Fetch RN, BSN,BC. Kerry Webb    Kaeleb Emond G 02/07/2016, 11:58 AM

## 2017-01-30 ENCOUNTER — Encounter: Payer: Self-pay | Admitting: General Surgery

## 2017-02-05 ENCOUNTER — Ambulatory Visit (INDEPENDENT_AMBULATORY_CARE_PROVIDER_SITE_OTHER): Payer: BC Managed Care – PPO | Admitting: General Surgery

## 2017-02-05 ENCOUNTER — Encounter: Payer: Self-pay | Admitting: General Surgery

## 2017-02-05 VITALS — BP 128/64 | HR 72 | Resp 12 | Ht 64.0 in | Wt 183.0 lb

## 2017-02-05 DIAGNOSIS — D241 Benign neoplasm of right breast: Secondary | ICD-10-CM | POA: Diagnosis not present

## 2017-02-05 DIAGNOSIS — Z803 Family history of malignant neoplasm of breast: Secondary | ICD-10-CM | POA: Diagnosis not present

## 2017-02-05 NOTE — Patient Instructions (Signed)
Advised patient to continue mammogram and self breast checks. Call the office with any issues.

## 2017-02-05 NOTE — Progress Notes (Deleted)
g

## 2017-02-05 NOTE — Progress Notes (Signed)
Patient ID: Kerry Webb, female   DOB: [birth year], 51 y.o.   MRN: [id number]  Chief Complaint  Patient presents with  . Follow-up    mammogram    HPI Kerry Webb is a 51 y.o. female who presents for a breast follow up. The most recent mammogram was done on 01/29/17 at [facility]. Patient does perform regular self breast checks and gets regular mammograms done. Patient reports no new breast issues. Patient doing well.  Her mother recently passed away from liver cancer in January, she lived in [place]. Patient has gained 10lbs since her last visit-she is aware of it HPI  Past Medical History:  Diagnosis Date  . GERD (gastroesophageal reflux disease)   . Heart murmur 1967  . Hypertension 1993  . Lump or mass in breast    Left  . Seizures ([place]) 1983  . Ulcer 1985    Past Surgical History:  Procedure Laterality Date  . ABDOMINAL HYSTERECTOMY  2004  . APPENDECTOMY  2014  . BREAST BIOPSY Right 01-23-14   benign  . BREAST SURGERY Left 01/14/12  . BREAST SURGERY Right 05-16-14   benign  . COLONOSCOPY  2013   Dr. Candace Cruise    Family History  Problem Relation Age of Onset  . Cancer Maternal Grandmother        breast  . Liver cancer Mother   . Cancer Mother        liver    Social History Social History  Substance Use Topics  . Smoking status: Never Smoker  . Smokeless tobacco: Never Used  . Alcohol use No    Allergies  Allergen Reactions  . Shellfish Allergy Shortness Of Breath  . Asa [Aspirin]     Intestinal bleeding    Current Outpatient Prescriptions  Medication Sig Dispense Refill  . AmLODIPine Besylate (NORVASC PO) Take 1 tablet by mouth daily.    Marland Kitchen buPROPion (WELLBUTRIN XL) 150 MG 24 hr tablet     . Omega-3 Fatty Acids (FISH OIL) 1000 MG CAPS Take by mouth.    . Omeprazole (PRILOSEC PO) Take 1 capsule by mouth daily.    . potassium chloride SA (K-DUR,KLOR-CON) 20 MEQ tablet 20 mEq 4 (four) times daily. TAKE ONE TABLET BY MOUTH TWICE DAILY    . Sertraline HCl  (ZOLOFT PO) Take 1 tablet by mouth daily.    Marland Kitchen topiramate (TOPAMAX) 100 MG tablet Take 100 mg by mouth 2 (two) times daily.     No current facility-administered medications for this visit.     Review of Systems Review of Systems  Constitutional: Negative.   Respiratory: Negative.   Cardiovascular: Negative.     Blood pressure 128/64, pulse 72, resp. rate 12, height 5\' 4"  (1.626 m), weight 183 lb (83 kg).  Physical Exam Physical Exam  Constitutional: She is oriented to person, place, and time. She appears well-developed and well-nourished.  Eyes: Conjunctivae are normal. No scleral icterus.  Neck: Neck supple. No thyromegaly present.  Cardiovascular: Normal rate, regular rhythm and normal heart sounds.   Pulmonary/Chest: Effort normal and breath sounds normal. Right breast exhibits no inverted nipple, no mass, no nipple discharge, no skin change and no tenderness. Left breast exhibits no inverted nipple, no mass, no nipple discharge, no skin change and no tenderness.  Abdominal: Soft. Bowel sounds are normal. There is no tenderness.  Lymphadenopathy:    She has no cervical adenopathy.    She has no axillary adenopathy.  Neurological: She is alert and oriented to person,  place, and time.  Skin: Skin is warm and dry.  Psychiatric: She has a normal mood and affect.    Data Reviewed Mammogram and prior note reviewed Mammogram is stable. Assessment    History of prior breast biopsies-all benign. Remote FH of breast cancer Exam stable     Plan     Advised patient to continue mammogram and self breast checks with her PCP or her gynecologist . Call the office with any issues.        HPI, Physical Exam, Assessment and Plan have been scribed under the direction and in the presence of Mckinley Jewel, MD.  [name], CMA  I have completed the exam and reviewed the above documentation for accuracy and completeness.  I agree with the above.  Haematologist has been used  and any errors in dictation or transcription are unintentional.  Coree Brame G. Jamal Collin, M.D., F.A.C.S.    Junie Panning G 02/05/2017, 10:24 AM

## 2017-06-01 ENCOUNTER — Emergency Department: Payer: BC Managed Care – PPO

## 2017-06-01 ENCOUNTER — Other Ambulatory Visit: Payer: Self-pay

## 2017-06-01 ENCOUNTER — Emergency Department
Admission: EM | Admit: 2017-06-01 | Discharge: 2017-06-01 | Disposition: A | Discharge status: Home / Self Care | Payer: BC Managed Care – PPO | Attending: Student in an Organized Health Care Education/Training Program | Admitting: Student in an Organized Health Care Education/Training Program

## 2017-06-01 DIAGNOSIS — R569 Unspecified convulsions: Secondary | ICD-10-CM | POA: Diagnosis present

## 2017-06-01 DIAGNOSIS — Z79899 Other long term (current) drug therapy: Secondary | ICD-10-CM | POA: Insufficient documentation

## 2017-06-01 DIAGNOSIS — I10 Essential (primary) hypertension: Secondary | ICD-10-CM | POA: Insufficient documentation

## 2017-06-01 LAB — CBC WITH DIFFERENTIAL/PLATELET
BASOS ABS: 0.2 10*3/uL — AB (ref 0–0.1)
Basophils Relative: 1 %
EOS PCT: 2 %
Eosinophils Absolute: 0.3 10*3/uL (ref 0–0.7)
HEMATOCRIT: 42.5 % (ref 35.0–47.0)
Hemoglobin: 14 g/dL (ref 12.0–16.0)
LYMPHS ABS: 8.6 10*3/uL — AB (ref 1.0–3.6)
LYMPHS PCT: 57 %
MCH: 29.2 pg (ref 26.0–34.0)
MCHC: 32.9 g/dL (ref 32.0–36.0)
MCV: 88.7 fL (ref 80.0–100.0)
MONOS PCT: 7 %
Monocytes Absolute: 1.1 10*3/uL — ABNORMAL HIGH (ref 0.2–0.9)
NEUTROS ABS: 5.1 10*3/uL (ref 1.4–6.5)
Neutrophils Relative %: 33 %
Platelets: 307 10*3/uL (ref 150–440)
RBC: 4.79 MIL/uL (ref 3.80–5.20)
RDW: 13.6 % (ref 11.5–14.5)
WBC: 15.3 10*3/uL — AB (ref 3.6–11.0)

## 2017-06-01 LAB — COMPREHENSIVE METABOLIC PANEL
ALT: 27 U/L (ref 14–54)
AST: 26 U/L (ref 15–41)
Albumin: 3.7 g/dL (ref 3.5–5.0)
Alkaline Phosphatase: 132 U/L — ABNORMAL HIGH (ref 38–126)
Anion gap: 8 (ref 5–15)
BILIRUBIN TOTAL: 0.4 mg/dL (ref 0.3–1.2)
BUN: 10 mg/dL (ref 6–20)
CALCIUM: 8.9 mg/dL (ref 8.9–10.3)
CO2: 23 mmol/L (ref 22–32)
CREATININE: 0.59 mg/dL (ref 0.44–1.00)
Chloride: 107 mmol/L (ref 101–111)
Glucose, Bld: 192 mg/dL — ABNORMAL HIGH (ref 65–99)
Potassium: 2.9 mmol/L — ABNORMAL LOW (ref 3.5–5.1)
Sodium: 138 mmol/L (ref 135–145)
TOTAL PROTEIN: 7.5 g/dL (ref 6.5–8.1)

## 2017-06-01 LAB — HCG, QUANTITATIVE, PREGNANCY: HCG, BETA CHAIN, QUANT, S: 1 m[IU]/mL (ref <5)

## 2017-06-01 MED ORDER — LORAZEPAM 2 MG/ML IJ SOLN
1.0000 mg | Freq: Once | INTRAMUSCULAR | Status: AC
  Administered 2017-06-01: 1 mg via INTRAVENOUS
  Filled 2017-06-01: qty 1

## 2017-06-01 MED ORDER — AMLODIPINE BESYLATE 5 MG PO TABS
5.0000 mg | ORAL_TABLET | Freq: Once | ORAL | Status: AC
  Administered 2017-06-01: 5 mg via ORAL
  Filled 2017-06-01: qty 1

## 2017-06-01 MED ORDER — TOPIRAMATE 25 MG PO TABS
200.0000 mg | ORAL_TABLET | Freq: Every day | ORAL | Status: DC
  Administered 2017-06-01: 200 mg via ORAL
  Filled 2017-06-01: qty 8

## 2017-06-01 NOTE — ED Provider Notes (Signed)
[facility] Emergency Department Provider Note    None    (approximate)  I have reviewed the triage vital signs and the nursing notes.   HISTORY  Chief Complaint Seizures    HPI Kerry Webb is a 51 y.o. female with a history of seizure disorder on Topamax presents after having witnessed seizure like activity that occurred while she was at work.  Patient has been off of her Topamax for the past 5 days and is also been off of her home blood pressure medications.  States she is having cramping in bilateral upper extremities right now but does not have any other pain.  Denies any chest pain or shortness of breath.  Does have a mild headache.  No nausea or vomiting.  No report of trauma.  Past Medical History:  Diagnosis Date  . GERD (gastroesophageal reflux disease)   . Heart murmur 1967  . Hypertension 1993  . Lump or mass in breast    Left  . Seizures ([place]) 1983  . Ulcer 1985   Family History  Problem Relation Age of Onset  . Cancer Maternal Grandmother        breast  . Liver cancer Mother   . Cancer Mother        liver   Past Surgical History:  Procedure Laterality Date  . ABDOMINAL HYSTERECTOMY  2004  . APPENDECTOMY  2014  . BREAST BIOPSY Right 01-23-14   benign  . BREAST SURGERY Left 01/14/12  . BREAST SURGERY Right 05-16-14   benign  . COLONOSCOPY  2013   Dr. Candace Cruise   Patient Active Problem List   Diagnosis Date Noted  . Family history of breast cancer 01/06/2013  . History of nipple discharge 01/06/2013      Prior to Admission medications   Medication Sig Start Date End Date Taking? Authorizing Provider  AmLODIPine Besylate (NORVASC PO) Take 1 tablet by mouth daily.    [provider]  buPROPion (WELLBUTRIN XL) 150 MG 24 hr tablet  01/08/17   [provider]  Omega-3 Fatty Acids (FISH OIL) 1000 MG CAPS Take by mouth.    [provider]  Omeprazole (PRILOSEC PO) Take 1 capsule by mouth daily.     [provider]  potassium chloride SA (K-DUR,KLOR-CON) 20 MEQ tablet 20 mEq 4 (four) times daily. TAKE ONE TABLET BY MOUTH TWICE DAILY 01/17/14   [provider]  Sertraline HCl (ZOLOFT PO) Take 1 tablet by mouth daily.    [provider]  topiramate (TOPAMAX) 100 MG tablet Take 100 mg by mouth 2 (two) times daily.    [provider]    Allergies Shellfish allergy and Asa [aspirin]    Social History Social History   Tobacco Use  . Smoking status: Never Smoker  . Smokeless tobacco: Never Used  Substance Use Topics  . Alcohol use: No  . Drug use: No    Review of Systems Patient denies headaches, rhinorrhea, blurry vision, numbness, shortness of breath, chest pain, edema, cough, abdominal pain, nausea, vomiting, diarrhea, dysuria, fevers, rashes or hallucinations unless otherwise stated above in HPI. ____________________________________________   PHYSICAL EXAM:  VITAL SIGNS: Vitals:   06/01/17 1030  BP: (!) 217/124  Pulse: (!) 110  Resp: 20  Temp: 98.3 F (36.8 C)  SpO2: 100%    Constitutional: Alert slightly postictal in no acute distress Eyes: Conjunctivae are normal.  Head: Atraumatic. Nose: No congestion/rhinnorhea. Mouth/Throat: Mucous membranes are moist.   Neck: No stridor.  Painless ROM.  Cardiovascular: Normal rate, regular rhythm. Grossly normal heart sounds.  Good peripheral circulation. Respiratory: Normal respiratory effort.  No retractions. Lungs CTAB. Gastrointestinal: Soft and nontender. No distention. No abdominal bruits. No CVA tenderness. Genitourinary:  Musculoskeletal: No lower extremity tenderness nor edema.  No joint effusions. Neurologic:  Normal speech and language. No gross focal neurologic deficits are appreciated. No facial droop Skin:  Skin is warm, dry and intact. No rash noted. Psychiatric: Mood and affect are normal. Speech and behavior are normal.  ____________________________________________     LABS (all labs ordered are listed, but only abnormal results are displayed)  No results found for this or any previous visit (from the past 24 hour(s)). ____________________________________________  EKG My review and personal interpretation at Time: 12:01   Indication: seizure like activity  Rate: 95  Rhythm: sinus Axis: normal Other: no stemi, normal intervals, no wpw or brugada ____________________________________________  RADIOLOGY  I personally reviewed all radiographic images ordered to evaluate for the above acute complaints and reviewed radiology reports and findings.  These findings were personally discussed with the patient.  Please see medical record for radiology report.  ____________________________________________   PROCEDURES  Procedure(s) performed:  Procedures    Critical Care performed: no ____________________________________________   INITIAL IMPRESSION / ASSESSMENT AND PLAN / ED COURSE  Pertinent labs & imaging results that were available during my care of the patient were reviewed by me and considered in my medical decision making (see chart for details).  DDX: seizure, dysrhythmia, like to light abnormality.  Sepsis, unlikely encephalitis, medication noncompliance  Kerry Webb is a 51 y.o. who presents to the ED with history of seizures not taking her Topamax or blood pressure medications presents with witnessed seizure-like activity followed by postictal period as described above.  This time she has no focal neuro deficits.  Based on her market hypertension initial postictal.  Limited exam CT imaging was ordered to rule out evidence of intracranial hemorrhage.  CT imaging shows no acute abnormality.  Blood work shows mild leukocytosis likely secondary to seizure-like activity.  EKG with no signs of dysrhythmia or ischemia.  Patient feeling better and was given her Topamax.  Able to ambulate with a steady gait.  Patient stable and appropriate for  follow-up with PCP.      ____________________________________________   FINAL CLINICAL IMPRESSION(S) / ED DIAGNOSES  Final diagnoses:  Seizure-like activity ([place])      NEW MEDICATIONS STARTED DURING THIS VISIT:  This SmartLink is deprecated. Use AVSMEDLIST instead to display the medication list for a patient.   Note:  This document was prepared using Dragon voice recognition software and may include unintentional dictation errors.    Merlyn Lot, MD 06/01/17 [phone]

## 2017-06-01 NOTE — ED Triage Notes (Signed)
Possible seizure at work, history of seizures and states has been off meds x 5 days because she has been out. BP as noted, states has been off BP meds for same time. Patient is anxious, hyperventilating and jumping around on arrival. Able to slow breathing with direction.

## 2019-01-25 ENCOUNTER — Encounter: Payer: Self-pay | Admitting: *Deleted

## 2019-01-25 ENCOUNTER — Encounter: Payer: BC Managed Care – PPO | Attending: Family Medicine | Admitting: *Deleted

## 2019-01-25 ENCOUNTER — Other Ambulatory Visit: Payer: Self-pay

## 2019-01-25 VITALS — BP 158/98 | Ht 65.0 in | Wt 190.9 lb

## 2019-01-25 DIAGNOSIS — Z7984 Long term (current) use of oral hypoglycemic drugs: Secondary | ICD-10-CM | POA: Insufficient documentation

## 2019-01-25 DIAGNOSIS — E119 Type 2 diabetes mellitus without complications: Secondary | ICD-10-CM | POA: Insufficient documentation

## 2019-01-25 NOTE — Patient Instructions (Signed)
Check blood sugars 2 x day before breakfast and 2 hrs after supper every day Bring blood sugar records to the next class  Exercise: Continue walking for  30  minutes   3  days a week and gradually increase to 30 minutes 5 x week  Eat 3 meals day,   1  snack a day Space meals 4-6 hours apart  Avoid sugar sweetened drinks (juices)  Make an eye doctor appointment  Return for classes on:

## 2019-01-25 NOTE — Progress Notes (Signed)
Diabetes Self-Management Education  Visit Type: First/Initial  Appt. Start Time: 1315 Appt. End Time: 1430  01/25/2019  [name], identified by name and date of birth, is a 53 y.o. female with a diagnosis of Diabetes: Type 2.   ASSESSMENT  Blood pressure (!) 158/98, height 5\' 5"  (1.651 m), weight 190 lb 14.4 oz (86.6 kg). Body mass index is 31.77 kg/m.  Diabetes Self-Management Education - 01/25/19 1442      Visit Information   Visit Type  First/Initial      Initial Visit   Diabetes Type  Type 2    Are you currently following a meal plan?  No    Are you taking your medications as prescribed?  Yes    Date Diagnosed  3 1/2 months      Health Coping   How would you rate your overall health?  Fair      Psychosocial Assessment   Patient Belief/Attitude about Diabetes  Other (comment)   "overwhelming"   Self-care barriers  None    Self-management support  Doctor's office;Family    Patient Concerns  Nutrition/Meal planning;Medication;Healthy Lifestyle;Glycemic Control;Weight Control    Special Needs  None    Preferred Learning Style  Visual;Hands on    Learning Readiness  Change in progress    How often do you need to have someone help you when you read instructions, pamphlets, or other written materials from your doctor or pharmacy?  1 - Never    What is the last grade level you completed in school?  college      Pre-Education Assessment   Patient understands the diabetes disease and treatment process.  Needs Instruction    Patient understands incorporating nutritional management into lifestyle.  Needs Instruction    Patient undertands incorporating physical activity into lifestyle.  Needs Instruction    Patient understands using medications safely.  Needs Instruction    Patient understands monitoring blood glucose, interpreting and using results  Needs Review    Patient understands prevention, detection, and treatment of acute complications.  Needs Instruction     Patient understands prevention, detection, and treatment of chronic complications.  Needs Instruction    Patient understands how to develop strategies to address psychosocial issues.  Needs Instruction    Patient understands how to develop strategies to promote health/change behavior.  Needs Instruction      Complications   Last HgB A1C per patient/outside source  8.9 %   01/14/2019   How often do you check your blood sugar?  0 times/day (not testing)   Pt brought her own meter (One Touch Verio IQ) and was instructed on use. BG in the office was 182 mg/dL at 1:55 pm - 2 hrs pp.   Have you had a dilated eye exam in the past 12 months?  No    Have you had a dental exam in the past 12 months?  No    Are you checking your feet?  No      Dietary Intake   Breakfast  Pt eats vegetarian - egg with onions, peppers, tomatoes on tortilla; eggs, grits, toast;  fruit, peanut butter powder, chia seeds and almond milk    Snack (morning)  0-1 snack/day - popcorn, sun chips    Lunch  tuna sandwich; salmon patties with beans and macaroni salad; greens, beans and corn    Dinner  fish; bread, potatoes, beans, peas, corn, pasta, rice, broccoli, cauliflower, lettuce cabbage, greens carrots, cuccumbers    Beverage(s)  juice,  water      Exercise   Exercise Type  Light (walking / raking leaves)    How many days per week to you exercise?  3    How many minutes per day do you exercise?  30    Total minutes per week of exercise  90      Patient Education   Previous Diabetes Education  No    Disease state   Definition of diabetes, type 1 and 2, and the diagnosis of diabetes;Factors that contribute to the development of diabetes    Nutrition management   Role of diet in the treatment of diabetes and the relationship between the three main macronutrients and blood glucose level;Reviewed blood glucose goals for pre and post meals and how to evaluate the patients' food intake on their blood glucose level.    Physical  activity and exercise   Role of exercise on diabetes management, blood pressure control and cardiac health.    Medications  Reviewed patients medication for diabetes, action, purpose, timing of dose and side effects.    Monitoring  Taught/evaluated SMBG meter.;Purpose and frequency of SMBG.;Taught/discussed recording of test results and interpretation of SMBG.;Identified appropriate SMBG and/or A1C goals.;Yearly dilated eye exam    Chronic complications  Relationship between chronic complications and blood glucose control    Psychosocial adjustment  Identified and addressed patients feelings and concerns about diabetes      Individualized Goals (developed by patient)   Reducing Risk  Improve blood sugars Decrease medications Lose weight Lead a healthier lifestyle Become more fit     Outcomes   Expected Outcomes  Demonstrated interest in learning. Expect positive outcomes       Individualized Plan for Diabetes Self-Management Training:   Learning Objective:  Patient will have a greater understanding of diabetes self-management. Patient education plan is to attend individual and/or group sessions per assessed needs and concerns.   Plan:   Patient Instructions  Check blood sugars 2 x day before breakfast and 2 hrs after supper every day Bring blood sugar records to the next class Exercise: Continue walking for  30  minutes   3  days a week and gradually increase to 30 minutes 5 x week Eat 3 meals day,   1  snack a day Space meals 4-6 hours apart Avoid sugar sweetened drinks (juices) Make an eye doctor appointment  Expected Outcomes:  Demonstrated interest in learning. Expect positive outcomes  Education material provided:  General Meal Planning Guidelines Simple Meal Plan  If problems or questions, patient to contact team via:  Johny Drilling, RN, CCM, CDE [phone]  Future DSME appointment:  Pt to check her work calendar and call back to schedule classes.

## 2019-01-31 ENCOUNTER — Telehealth: Payer: Self-pay | Admitting: *Deleted

## 2019-01-31 NOTE — Telephone Encounter (Signed)
Received voice mail from patient. She reported that she is having higher numbers and not feeling well. Called patient and she reports her fasting blood sugars are mostly 150's mg/dL but she had 1 reading of 187 mg/dL. She reports pp's of 140's-250's mg/dLand 2 readings of 200's were after eating pasta. Discussed meals of protein, carbs and fat. She has also been eating a snack at bedtime with yogurt (200 calories) or fruit. Discussed smaller snack if she needs one at bedtime that is only 15 grams of carbs or less with a small serving of protein or have just small protein serving (nuts, peanut butter). Asked her to try not to eat fruit at bedtime since that can raise fasting blood sugars. She reports swelling in her fingers but this may not be related to blood sugar. She reports that she hasn't been drinking her water and her blood pressure has been elevated. She is going to try to make these changes and follow up in a few days with BG readings. She has been taking her diabetes medication (Janumet) 2 x day. Instructed her to call back for any further questions.

## 2019-02-02 ENCOUNTER — Telehealth: Payer: Self-pay | Admitting: *Deleted

## 2019-02-02 NOTE — Telephone Encounter (Signed)
Received call from patient. She reports that she will be her tomorrow for Diabetes class 1.

## 2019-02-03 ENCOUNTER — Other Ambulatory Visit: Payer: Self-pay

## 2019-02-03 ENCOUNTER — Encounter: Payer: BC Managed Care – PPO | Admitting: Dietician

## 2019-02-03 ENCOUNTER — Encounter: Payer: Self-pay | Admitting: Dietician

## 2019-02-03 VITALS — Wt 188.2 lb

## 2019-02-03 DIAGNOSIS — E119 Type 2 diabetes mellitus without complications: Secondary | ICD-10-CM | POA: Diagnosis not present

## 2019-02-03 NOTE — Progress Notes (Signed)
Appt. Start Time: 9:00  Appt. End Time: 1100 am  Class 1 Diabetes Overview - define DM; state own type of DM; identify functions of pancreas and insulin; define insulin deficiency vs insulin resistance  Psychosocial - identify DM as a source of stress; state the effects of stress on BG control  Nutritional Management - describe effects of food on blood glucose; identify sources of carbohydrate, protein and fat; verbalize the importance of balance meals in controlling blood glucose  Exercise - describe the effects of exercise on blood glucose and importance of regular exercise in controlling diabetes; state a plan for personal exercise; verbalize contraindications for exercise  Self-Monitoring - state importance of SMBG; use SMBG results to effectively manage diabetes; identify importance of regular HbA1C testing and goals for results  Acute Complications - recognize hyperglycemia and hypoglycemia with causes and effects; identify blood glucose results as high, low or in control; list steps in treating and preventing high and low blood glucose  Chronic Complications/Foot, Skin, Eye Dental Care - identify possible long-term complications of diabetes (retinopathy, neuropathy, nephropathy, cardiovascular disease, infections); state importance of daily self-foot exams; describe how to examine feet and what to look for; explain appropriate eye and dental care  Lifestyle Changes/Goals & Health/Community Resources - state benefits of making appropriate lifestyle changes; identify habits that need to change (meals, tobacco, alcohol); identify strategies to reduce risk factors for personal health  Pregnancy/Sexual Health - define gestational diabetes; state importance of good blood glucose control and birth control prior to pregnancy; state importance of good blood glucose control in preventing sexual problems (impotence, vaginal dryness, infections, loss of desire); state relationship of blood glucose  control and pregnancy outcome; describe risk of maternal and fetal complications  Teaching Materials Used: Class 1 Slides/Notebook Diabetes Booklet ID Card  Medic Alert/Medic ID Forms Sleep Evaluation Exercise Handout Planning a Balanced Meal Goals for Class 1

## 2019-02-16 ENCOUNTER — Other Ambulatory Visit: Payer: Self-pay

## 2019-02-16 ENCOUNTER — Encounter: Payer: Self-pay | Admitting: Emergency Medicine

## 2019-02-16 DIAGNOSIS — R739 Hyperglycemia, unspecified: Secondary | ICD-10-CM | POA: Insufficient documentation

## 2019-02-16 DIAGNOSIS — Z5321 Procedure and treatment not carried out due to patient leaving prior to being seen by health care provider: Secondary | ICD-10-CM | POA: Diagnosis not present

## 2019-02-16 LAB — CBC WITH DIFFERENTIAL/PLATELET
Abs Immature Granulocytes: 0.02 10*3/uL (ref 0.00–0.07)
Basophils Absolute: 0.1 10*3/uL (ref 0.0–0.1)
Basophils Relative: 1 %
Eosinophils Absolute: 0.3 10*3/uL (ref 0.0–0.5)
Eosinophils Relative: 2 %
HCT: 40 % (ref 36.0–46.0)
Hemoglobin: 13.4 g/dL (ref 12.0–15.0)
Immature Granulocytes: 0 %
Lymphocytes Relative: 62 %
Lymphs Abs: 7.1 10*3/uL — ABNORMAL HIGH (ref 0.7–4.0)
MCH: 28.3 pg (ref 26.0–34.0)
MCHC: 33.5 g/dL (ref 30.0–36.0)
MCV: 84.6 fL (ref 80.0–100.0)
Monocytes Absolute: 0.5 10*3/uL (ref 0.1–1.0)
Monocytes Relative: 5 %
Neutro Abs: 3.3 10*3/uL (ref 1.7–7.7)
Neutrophils Relative %: 30 %
Platelets: 319 10*3/uL (ref 150–400)
RBC: 4.73 MIL/uL (ref 3.87–5.11)
RDW: 13.6 % (ref 11.5–15.5)
WBC: 11.4 10*3/uL — ABNORMAL HIGH (ref 4.0–10.5)
nRBC: 0 % (ref 0.0–0.2)

## 2019-02-16 LAB — URINALYSIS, COMPLETE (UACMP) WITH MICROSCOPIC
Bilirubin Urine: NEGATIVE
Glucose, UA: NEGATIVE mg/dL
Ketones, ur: NEGATIVE mg/dL
Leukocytes,Ua: NEGATIVE
Nitrite: NEGATIVE
Protein, ur: 30 mg/dL — AB
Specific Gravity, Urine: 1.015 (ref 1.005–1.030)
pH: 6 (ref 5.0–8.0)

## 2019-02-16 LAB — GLUCOSE, CAPILLARY: Glucose-Capillary: 141 mg/dL — ABNORMAL HIGH (ref 70–99)

## 2019-02-16 LAB — COMPREHENSIVE METABOLIC PANEL
ALT: 29 U/L (ref 0–44)
AST: 20 U/L (ref 15–41)
Albumin: 4.2 g/dL (ref 3.5–5.0)
Alkaline Phosphatase: 84 U/L (ref 38–126)
Anion gap: 11 (ref 5–15)
BUN: 17 mg/dL (ref 6–20)
CO2: 26 mmol/L (ref 22–32)
Calcium: 9.6 mg/dL (ref 8.9–10.3)
Chloride: 103 mmol/L (ref 98–111)
Creatinine, Ser: 0.64 mg/dL (ref 0.44–1.00)
GFR calc Af Amer: >60 mL/min (ref >60)
GFR calc non Af Amer: >60 mL/min (ref >60)
Glucose, Bld: 122 mg/dL — ABNORMAL HIGH (ref 70–99)
Potassium: 3 mmol/L — ABNORMAL LOW (ref 3.5–5.1)
Sodium: 140 mmol/L (ref 135–145)
Total Bilirubin: 0.6 mg/dL (ref 0.3–1.2)
Total Protein: 7.7 g/dL (ref 6.5–8.1)

## 2019-02-16 NOTE — ED Triage Notes (Signed)
Patient ambulatory to triage with steady gait, without difficulty or distress noted; pt reports FSBS 600 at home; denies c/o; st newly dx in March and taking [place]

## 2019-02-17 ENCOUNTER — Emergency Department
Admission: EM | Admit: 2019-02-17 | Discharge: 2019-02-17 | Disposition: A | Discharge status: Home / Self Care | Payer: BC Managed Care – PPO | Attending: Emergency Medicine | Admitting: Emergency Medicine

## 2019-02-17 ENCOUNTER — Telehealth: Payer: Self-pay | Admitting: *Deleted

## 2019-02-17 NOTE — Telephone Encounter (Signed)
Patient left a voicemail for me to call. Called her and she reported that she went to the ER early this morning due to concern over high blood sugar. She reports that she was sweating and not feeling well at work. She checked her blood sugar at home with reading of 600 mg/dL. She rechecked with reading of 359 mg/dL. The patient reports that she went through triage and had labs done but didn't want to stay to see MD due to crowds and sickness in ER. She was told that her blood sugar was 120's but she had protein in her urine. She was told to see her PCP and she has an appt on Monday 02/21/19. Her blood sugars are back down to 110's-140's mg/dL. She is feeling better but stayed out of work today. Reviewed for her to check blood sugar 1 hour after she gets a high reading, wash hands and use a new lancet. Explained that it could have been problem with meter or blood sample. Discussed other issues that can raise blood sugars such as stress, sickness. She reports that she will call back to reschedule her last 2 classes and may need to come to the evening classes. Instructed her to call back for any questions.

## 2019-02-17 NOTE — ED Notes (Signed)
Pt to STAT desk and states she is going to see her pcp tomorrow. Verbalized understanding of risks of leaving.

## 2019-04-12 ENCOUNTER — Encounter: Payer: Self-pay | Admitting: *Deleted

## 2019-05-17 ENCOUNTER — Other Ambulatory Visit: Payer: Self-pay

## 2019-05-17 ENCOUNTER — Inpatient Hospital Stay
Admission: EM | Admit: 2019-05-17 | Discharge: 2019-05-20 | DRG: 419 | Disposition: A | Discharge status: Home / Self Care | Payer: BC Managed Care – PPO | Attending: Surgery | Admitting: Surgery

## 2019-05-17 ENCOUNTER — Encounter: Payer: Self-pay | Admitting: *Deleted

## 2019-05-17 ENCOUNTER — Emergency Department: Payer: BC Managed Care – PPO

## 2019-05-17 DIAGNOSIS — E785 Hyperlipidemia, unspecified: Secondary | ICD-10-CM | POA: Diagnosis present

## 2019-05-17 DIAGNOSIS — F419 Anxiety disorder, unspecified: Secondary | ICD-10-CM | POA: Diagnosis present

## 2019-05-17 DIAGNOSIS — Z79899 Other long term (current) drug therapy: Secondary | ICD-10-CM

## 2019-05-17 DIAGNOSIS — Z800 Family history of malignant neoplasm of digestive organs: Secondary | ICD-10-CM | POA: Diagnosis not present

## 2019-05-17 DIAGNOSIS — Z886 Allergy status to analgesic agent status: Secondary | ICD-10-CM | POA: Diagnosis not present

## 2019-05-17 DIAGNOSIS — K660 Peritoneal adhesions (postprocedural) (postinfection): Secondary | ICD-10-CM | POA: Diagnosis present

## 2019-05-17 DIAGNOSIS — Z833 Family history of diabetes mellitus: Secondary | ICD-10-CM | POA: Diagnosis not present

## 2019-05-17 DIAGNOSIS — Z91013 Allergy to seafood: Secondary | ICD-10-CM | POA: Diagnosis not present

## 2019-05-17 DIAGNOSIS — Z20828 Contact with and (suspected) exposure to other viral communicable diseases: Secondary | ICD-10-CM | POA: Diagnosis present

## 2019-05-17 DIAGNOSIS — Z91018 Allergy to other foods: Secondary | ICD-10-CM | POA: Diagnosis not present

## 2019-05-17 DIAGNOSIS — Z803 Family history of malignant neoplasm of breast: Secondary | ICD-10-CM

## 2019-05-17 DIAGNOSIS — K8000 Calculus of gallbladder with acute cholecystitis without obstruction: Principal | ICD-10-CM | POA: Diagnosis present

## 2019-05-17 DIAGNOSIS — K810 Acute cholecystitis: Secondary | ICD-10-CM

## 2019-05-17 DIAGNOSIS — R1011 Right upper quadrant pain: Secondary | ICD-10-CM | POA: Diagnosis present

## 2019-05-17 DIAGNOSIS — E119 Type 2 diabetes mellitus without complications: Secondary | ICD-10-CM | POA: Diagnosis present

## 2019-05-17 DIAGNOSIS — K219 Gastro-esophageal reflux disease without esophagitis: Secondary | ICD-10-CM | POA: Diagnosis present

## 2019-05-17 DIAGNOSIS — I10 Essential (primary) hypertension: Secondary | ICD-10-CM | POA: Diagnosis present

## 2019-05-17 LAB — CBC
HCT: 41 % (ref 36.0–46.0)
Hemoglobin: 13.3 g/dL (ref 12.0–15.0)
MCH: 28.2 pg (ref 26.0–34.0)
MCHC: 32.4 g/dL (ref 30.0–36.0)
MCV: 86.9 fL (ref 80.0–100.0)
Platelets: 295 10*3/uL (ref 150–400)
RBC: 4.72 MIL/uL (ref 3.87–5.11)
RDW: 13.4 % (ref 11.5–15.5)
WBC: 10.8 10*3/uL — ABNORMAL HIGH (ref 4.0–10.5)
nRBC: 0 % (ref 0.0–0.2)

## 2019-05-17 LAB — LIPASE, BLOOD: Lipase: 39 U/L (ref 11–51)

## 2019-05-17 LAB — URINALYSIS, COMPLETE (UACMP) WITH MICROSCOPIC
Bacteria, UA: NONE SEEN
Bilirubin Urine: NEGATIVE
Glucose, UA: NEGATIVE mg/dL
Hgb urine dipstick: NEGATIVE
Ketones, ur: NEGATIVE mg/dL
Leukocytes,Ua: NEGATIVE
Nitrite: NEGATIVE
Protein, ur: 30 mg/dL — AB
Specific Gravity, Urine: 1.014 (ref 1.005–1.030)
pH: 8 (ref 5.0–8.0)

## 2019-05-17 LAB — COMPREHENSIVE METABOLIC PANEL
ALT: 63 U/L — ABNORMAL HIGH (ref 0–44)
AST: 110 U/L — ABNORMAL HIGH (ref 15–41)
Albumin: 3.6 g/dL (ref 3.5–5.0)
Alkaline Phosphatase: 117 U/L (ref 38–126)
Anion gap: 10 (ref 5–15)
BUN: 14 mg/dL (ref 6–20)
CO2: 26 mmol/L (ref 22–32)
Calcium: 9.1 mg/dL (ref 8.9–10.3)
Chloride: 101 mmol/L (ref 98–111)
Creatinine, Ser: 0.68 mg/dL (ref 0.44–1.00)
GFR calc Af Amer: >60 mL/min (ref >60)
GFR calc non Af Amer: >60 mL/min (ref >60)
Glucose, Bld: 186 mg/dL — ABNORMAL HIGH (ref 70–99)
Potassium: 3.1 mmol/L — ABNORMAL LOW (ref 3.5–5.1)
Sodium: 137 mmol/L (ref 135–145)
Total Bilirubin: 0.5 mg/dL (ref 0.3–1.2)
Total Protein: 7 g/dL (ref 6.5–8.1)

## 2019-05-17 MED ORDER — SODIUM CHLORIDE 0.9 % IV SOLN
1.0000 g | Freq: Once | INTRAVENOUS | Status: AC
  Administered 2019-05-17: 1 g via INTRAVENOUS
  Filled 2019-05-17: qty 10

## 2019-05-17 MED ORDER — SODIUM CHLORIDE 0.9 % IV BOLUS
1000.0000 mL | Freq: Once | INTRAVENOUS | Status: AC
  Administered 2019-05-17: 1000 mL via INTRAVENOUS

## 2019-05-17 MED ORDER — MORPHINE SULFATE (PF) 4 MG/ML IV SOLN
4.0000 mg | Freq: Once | INTRAVENOUS | Status: AC
  Administered 2019-05-17: 4 mg via INTRAVENOUS
  Filled 2019-05-17: qty 1

## 2019-05-17 MED ORDER — ONDANSETRON HCL 4 MG/2ML IJ SOLN
4.0000 mg | Freq: Once | INTRAMUSCULAR | Status: AC
  Administered 2019-05-17: 4 mg via INTRAVENOUS
  Filled 2019-05-17: qty 2

## 2019-05-17 MED ORDER — HYDRALAZINE HCL 10 MG PO TABS
10.0000 mg | ORAL_TABLET | Freq: Three times a day (TID) | ORAL | Status: DC | PRN
  Administered 2019-05-17: 10 mg via ORAL
  Filled 2019-05-17 (×2): qty 1

## 2019-05-17 NOTE — H&P (Signed)
Subjective:   CC: Acute cholecystitis  HPI:  Kerry Webb is a 53 y.o. female who is consulted by [facility] for evaluation of above cc.  Symptoms were first noted 1 day ago. Pain is sharp, localized to right upper quadrant and epigastric area.  Associated with nothing specific, exacerbated by nothing specific     Past Medical History:  has a past medical history of Diabetes mellitus without complication ([place]), GERD (gastroesophageal reflux disease), Heart murmur (1967), Hypertension (1993), Lump or mass in breast, Seizures ([place]) (1983), and Ulcer (1985).  Past Surgical History:  has a past surgical history that includes Abdominal hysterectomy (2004); Colonoscopy (2013); Appendectomy (2014); Breast surgery (Left, 01/14/12); Breast biopsy (Right, 01-23-14); and Breast surgery (Right, 05-16-14).  Family History: family history includes Cancer in her maternal grandmother and mother; Diabetes in her maternal aunt; Liver cancer in her mother.  Social History:  reports that she has never smoked. She has never used smokeless tobacco. She reports current alcohol use of about 1.0 standard drinks of alcohol per week. She reports that she does not use drugs.  Current Medications:  albuterol (VENTOLIN HFA) 108 (90 Base) MCG/ACT inhaler Inhale 2 puffs into the lungs every 6 (six) hours as needed.  Lysle Pearl, Jaequan Propes, DO Reordered  Ordered as: albuterol (VENTOLIN HFA) 108 (90 Base) MCG/ACT inhaler 2 puff - 2 puff, Inhalation, Every 6 hours PRN, wheezing, Starting Tue 05/17/19 at 2115  atorvastatin (LIPITOR) 40 MG tablet Take 40 mg by mouth daily. Lysle Pearl, Jakeisha Stricker, DO Reordered  Ordered as: atorvastatin (LIPITOR) tablet 40 mg - 40 mg, Oral, Daily, First dose on Tue 05/17/19 at 2130  BIOTIN PO Take 1 tablet by mouth daily. Lysle Pearl, Lilliahna Schubring, DO Not Ordered  buPROPion (WELLBUTRIN XL) 150 MG 24 hr tablet Take 150 mg by mouth daily.  Lysle Pearl, Sebastian Dzik, DO Reordered  Ordered as: buPROPion (WELLBUTRIN XL) 24 hr tablet 150 mg -  150 mg, Oral, Daily, First dose on Tue 05/17/19 at 2130  busPIRone (BUSPAR) 7.5 MG tablet Take 3 tablets by mouth daily. Lysle Pearl, Brentin Shin, DO Reordered  Ordered as: busPIRone (BUSPAR) tablet 22.5 mg - 22.5 mg, Oral, Daily, First dose on Tue 05/17/19 at 2130  fluticasone (FLONASE) 50 MCG/ACT nasal spray Place 1 spray into the nose 2 (two) times daily as needed. Lysle Pearl, Joss Mcdill, DO Reordered  Ordered as: fluticasone (FLONASE) 50 MCG/ACT nasal spray 1 spray - 1 spray, Each Nare, 2 times daily PRN, allergies, Starting Tue 05/17/19 at 2115  GINKGO BILOBA PO Take 1 tablet by mouth daily. Lysle Pearl, Elika Godar, DO Not Ordered  hydrochlorothiazide (HYDRODIURIL) 25 MG tablet Take 25 mg by mouth daily. Lysle Pearl, Rebekkah Powless, DO Reordered  Ordered as: hydrochlorothiazide (HYDRODIURIL) tablet 25 mg - 25 mg, Oral, Daily, First dose on Tue 05/17/19 at 2130  metFORMIN (GLUCOPHAGE-XR) 500 MG 24 hr tablet Take 500 mg by mouth 2 (two) times daily. Benjamine Sprague, DO Not Ordered  Omega-3 Fatty Acids (FISH OIL) 1000 MG CAPS Take 1 capsule by mouth daily.  Lysle Pearl, Gearline Spilman, DO Not Ordered  omeprazole (PRILOSEC) 20 MG capsule Take 1 capsule by mouth daily as needed.  Lysle Pearl, Ahad Colarusso, DO Not Ordered  potassium chloride SA (KLOR-CON M20) 20 MEQ tablet Take 1 tablet by mouth daily. Lysle Pearl, Quintin Hjort, DO Reordered  Ordered as: potassium chloride SA (KLOR-CON) CR tablet 20 mEq - 20 mEq, Oral, Daily, First dose on Tue 05/17/19 at 2130  sertraline (ZOLOFT) 100 MG tablet Take 100 mg by mouth daily. Lysle Pearl, Ethen Bannan, DO Reordered  Ordered as: sertraline (ZOLOFT) tablet  100 mg - 100 mg, Oral, Daily, First dose on Tue 05/17/19 at 2130  sitaGLIPtin-metformin (JANUMET) 50-500 MG tablet Take 1 tablet by mouth 2 (two) times a day. Lysle Pearl, Madlyn Crosby, DO Not Ordered  topiramate (TOPAMAX) 100 MG tablet Take 100 mg by mouth 2 (two) times daily. Lysle Pearl, Rylen Hou, DO Reordered  Ordered as: topiramate (TOPAMAX) tablet 100 mg - 100 mg, Oral, 2 times daily, First dose on Tue 05/17/19 at 2200      Allergies:  Allergies as of 05/17/2019 - Review Complete 05/17/2019  Allergen Reaction Noted  . Cashew nut oil Shortness Of Breath 04/23/2015  . Shellfish allergy Shortness Of Breath 11/08/2012  . Asa [aspirin]  11/08/2012    ROS:  General: Denies weight loss, weight gain, fatigue, fevers, chills, and night sweats. Eyes: Denies blurry vision, double vision, eye pain, itchy eyes, and tearing. Ears: Denies hearing loss, earache, and ringing in ears. Nose: Denies sinus pain, congestion, infections, runny nose, and nosebleeds. Mouth/throat: Denies hoarseness, sore throat, bleeding gums, and difficulty swallowing. Heart: Denies chest pain, palpitations, racing heart, irregular heartbeat, leg pain or swelling, and decreased activity tolerance. Respiratory: Denies breathing difficulty, shortness of breath, wheezing, cough, and sputum. GI: Denies change in appetite, heartburn, nausea, vomiting, constipation, diarrhea, and blood in stool. GU: Denies difficulty urinating, pain with urinating, urgency, frequency, blood in urine. Musculoskeletal: Denies joint stiffness, pain, swelling, muscle weakness. Skin: Denies rash, itching, mass, tumors, sores, and boils Neurologic: Denies headache, fainting, dizziness, seizures, numbness, and tingling. Psychiatric: Denies depression, anxiety, difficulty sleeping, and memory loss. Endocrine: Denies heat or cold intolerance, and increased thirst or urination. Blood/lymph: Denies easy bruising, easy bruising, and swollen glands     Objective:     BP (!) 196/110   Pulse (!) 106   Temp 99.4 F (37.4 C) (Oral)   Resp 16   Ht 5\' 4"  (1.626 m)   Wt 86.6 kg   SpO2 100%   BMI 32.79 kg/m    Constitutional :  alert, cooperative, appears stated age and no distress  Lymphatics/Throat:  no asymmetry, masses, or scars  Respiratory:  clear to auscultation bilaterally  Cardiovascular:  regular rate and rhythm  Gastrointestinal: Soft, no guarding, focal  tenderness in epigastric and right upper quadrant.   Musculoskeletal: Steady movement  Skin: Cool and moist  Psychiatric: Normal affect, non-agitated, not confused       LABS:  CMP Latest Ref Rng & Units 05/17/2019 02/16/2019 06/01/2017  Glucose 70 - 99 mg/dL 186(H) 122(H) 192(H)  BUN 6 - 20 mg/dL 14 17 10   Creatinine 0.44 - 1.00 mg/dL 0.68 0.64 0.59  Sodium 135 - 145 mmol/L 137 140 138  Potassium 3.5 - 5.1 mmol/L 3.1(L) 3.0(L) 2.9(L)  Chloride 98 - 111 mmol/L 101 103 107  CO2 22 - 32 mmol/L 26 26 23   Calcium 8.9 - 10.3 mg/dL 9.1 9.6 8.9  Total Protein 6.5 - 8.1 g/dL 7.0 7.7 7.5  Total Bilirubin 0.3 - 1.2 mg/dL 0.5 0.6 0.4  Alkaline Phos 38 - 126 U/L 117 84 132(H)  AST 15 - 41 U/L 110(H) 20 26  ALT 0 - 44 U/L 63(H) 29 27   CBC Latest Ref Rng & Units 05/17/2019 02/16/2019 06/01/2017  WBC 4.0 - 10.5 K/uL 10.8(H) 11.4(H) 15.3(H)  Hemoglobin 12.0 - 15.0 g/dL 13.3 13.4 14.0  Hematocrit 36.0 - 46.0 % 41.0 40.0 42.5  Platelets 150 - 400 K/uL 295 319 307     RADS: CLINICAL DATA:  Right upper quadrant pain  EXAM:  ULTRASOUND ABDOMEN LIMITED RIGHT UPPER QUADRANT  COMPARISON:  None.  FINDINGS: Gallbladder:  Multiple stones in the gallbladder measuring up to 9 mm. Increased wall thickness at 5 mm. Small amount of pericholecystic fluid. Positive sonographic Murphy.  Common bile duct:  Diameter: 3.3 mm  Liver:  Increased hepatic echogenicity without focal hepatic abnormality. Portal vein is patent on color Doppler imaging with normal direction of blood flow towards the liver.  Other: None.  IMPRESSION: 1. Gallstones with wall thickening, positive sonographic Murphy and small amount of pericholecystic fluid, all concerning for an acute cholecystitis. No biliary dilatation 2. Echogenic liver suggesting steatosis   Electronically Signed   By: Donavan Foil M.D.   On: 05/17/2019 20:25  Assessment:      Acute  cholecystitis Hyperlipidemia Diabetes Hypertension Anxiety  Plan:      Discussed the risk of surgery including post-op infxn, seroma, biloma, chronic pain, poor-delayed wound healing, retained gallstone, conversion to open procedure, post-op SBO or ileus, and need for additional procedures to address said risks.  The risks of general anesthetic including MI, CVA, sudden death or even reaction to anesthetic medications also discussed. Alternatives include continued observation.  Benefits include possible symptom relief, prevention of complications including acute cholecystitis, pancreatitis.  Typical post operative recovery of 3-5 days rest, continued pain in area and incision sites, possible loose stools up to 4-6 weeks, also discussed.  The patient understands the risks, any and all questions were answered to the patient's satisfaction.  Admit for antibiotics, IV fluids, n.p.o. until able to perform surgery.  Discussed robotic assisted lap surgery if equipment available, otherwise we will proceed with laparoscopic cholecystectomy.  Pending Covid testing as well.  Hyperlipidemia Diabetes Hypertension Anxiety We will continue home medications, minus Metformin for the chronic conditions above.  Sliding scale insulin as well.

## 2019-05-17 NOTE — ED Notes (Signed)
U/s in with pt now.  

## 2019-05-17 NOTE — ED Triage Notes (Signed)
Pt to ED reporting new onset of abd pain this morning with bloating and a 6 lb weight gain over a week. Severe tenderness. Pt has had appendix removed. Nausea with one episode of vomiting earlier this afternoon. Normal BM today. No diarrhea. No fevers.

## 2019-05-17 NOTE — ED Notes (Signed)
Surgeon in with pt now

## 2019-05-17 NOTE — ED Notes (Signed)
Iv started  meds given.   

## 2019-05-17 NOTE — ED Provider Notes (Signed)
[facility] Emergency Department Provider Note  Time seen: 7:52 PM  I have reviewed the triage vital signs and the nursing notes.   HISTORY  Chief Complaint Abdominal Pain   HPI Kerry Webb is a 53 y.o. female with a past medical history of diabetes, hypertension, presents to the emergency department for abdominal pain.  According to the patient  she awoke with upper abdominal pain this morning that has progressively worsened throughout the day appears to be more in the right upper quadrant.  Patient went to urgent care was was sent to the emergency department for further evaluation.  Patient found to have a borderline low-grade temperature 99.4 in the emergency department.  States nausea with 2 episodes of vomiting today states normal bowel movement x2 or 3 today.  Denies diarrhea.  Denies any cough or shortness of breath.  Past Medical History:  Diagnosis Date  . Diabetes mellitus without complication ([place])   . GERD (gastroesophageal reflux disease)   . Heart murmur 1967  . Hypertension 1993  . Lump or mass in breast    Left  . Seizures ([place]) 1983  . Ulcer 1985    Patient Active Problem List   Diagnosis Date Noted  . Family history of breast cancer 01/06/2013  . History of nipple discharge 01/06/2013    Past Surgical History:  Procedure Laterality Date  . ABDOMINAL HYSTERECTOMY  2004  . APPENDECTOMY  2014  . BREAST BIOPSY Right 01-23-14   benign  . BREAST SURGERY Left 01/14/12  . BREAST SURGERY Right 05-16-14   benign  . COLONOSCOPY  2013   Dr. Candace Cruise    Prior to Admission medications   Medication Sig Start Date End Date Taking? Authorizing Provider  albuterol (VENTOLIN HFA) 108 (90 Base) MCG/ACT inhaler Inhale 2 puffs into the lungs every 6 (six) hours as needed.  09/11/15   [provider]  BIOTIN PO Take 1 tablet by mouth daily.    [provider]  buPROPion (WELLBUTRIN XL) 150 MG 24 hr tablet Take 150 mg by mouth daily.   01/08/17   [provider]  busPIRone (BUSPAR) 7.5 MG tablet Take 3 tablets by mouth daily. 01/12/19   [provider]  fluticasone (FLONASE) 50 MCG/ACT nasal spray Place 1 spray into the nose 2 (two) times daily as needed. 09/02/18   [provider]  GINKGO BILOBA PO Take 1 tablet by mouth daily.    [provider]  hydrochlorothiazide (HYDRODIURIL) 25 MG tablet Take 25 mg by mouth daily. 01/14/19   [provider]  Omega-3 Fatty Acids (FISH OIL) 1000 MG CAPS Take 1 capsule by mouth daily.     [provider]  omeprazole (PRILOSEC) 20 MG capsule Take 1 capsule by mouth daily as needed.  09/12/16   [provider]  potassium chloride SA (KLOR-CON M20) 20 MEQ tablet Take 1 tablet by mouth daily. 07/02/18   [provider]  Sertraline HCl (ZOLOFT PO) Take 100 mg by mouth daily.     [provider]  sitaGLIPtin-metformin (JANUMET) 50-500 MG tablet Take 1 tablet by mouth 2 (two) times a day. 12/27/18   [provider]  topiramate (TOPAMAX) 100 MG tablet Take 100 mg by mouth 2 (two) times daily.    [provider]    Allergies  Allergen Reactions  . Cashew Nut Oil Shortness Of Breath  . Shellfish Allergy Shortness Of Breath  . Asa [Aspirin]     Intestinal bleeding  Family History  Problem Relation Age of Onset  . Cancer Maternal Grandmother        breast  . Liver cancer Mother   . Cancer Mother        liver  . Diabetes Maternal Aunt     Social History Social History   Tobacco Use  . Smoking status: Never Smoker  . Smokeless tobacco: Never Used  Substance Use Topics  . Alcohol use: Yes    Alcohol/week: 1.0 standard drinks    Types: 1 Glasses of wine per week  . Drug use: No    Review of Systems Constitutional: Negative for fever. Cardiovascular: Negative for chest pain. Respiratory: Negative for shortness of breath. Gastrointestinal: Upper abdominal pain more so in the right upper  quadrant.  Positive for nausea vomiting x2.  Negative for diarrhea.  Negative constipation. Genitourinary: Negative for urinary compaints Musculoskeletal: Negative for musculoskeletal complaints Skin: Negative for skin complaints  Neurological: Negative for headache All other ROS negative  ____________________________________________   PHYSICAL EXAM:  VITAL SIGNS: ED Triage Vitals [05/17/19 1750]  Enc Vitals Group     BP (!) 193/114     Pulse Rate 96     Resp 16     Temp 99.4 F (37.4 C)     Temp Source Oral     SpO2 99 %     Weight 191 lb (86.6 kg)     Height 5\' 4"  (1.626 m)     Head Circumference      Peak Flow      Pain Score 9     Pain Loc      Pain Edu?      Excl. in [place]?     Constitutional: Alert and oriented. Well appearing and in no distress. Eyes: Normal exam ENT      Head: Normocephalic and atraumatic.      Mouth/Throat: Mucous membranes are moist. Cardiovascular: Normal rate, regular rhythm.  Respiratory: Normal respiratory effort without tachypnea nor retractions. Breath sounds are clear  Gastrointestinal: Soft, moderate right upper quadrant tenderness with mild guarding to this area.  No rebound. Musculoskeletal: Nontender with normal range of motion in all extremities. Neurologic:  Normal speech and language. No gross focal neurologic deficits Skin:  Skin is warm, dry and intact.  Psychiatric: Mood and affect are normal.   ____________________________________________   RADIOLOGY  Ultrasound consistent with acute cholecystitis.  ____________________________________________   INITIAL IMPRESSION / ASSESSMENT AND PLAN / ED COURSE  Pertinent labs & imaging results that were available during my care of the patient were reviewed by me and considered in my medical decision making (see chart for details).   Patient presents emergency department for abdominal pain since this morning that is progressively worsened.  Patient found to have a low-grade  borderline temperature 99.4 in the emergency department.  Appears quite uncomfortable during my exam especially of palpation of the right upper quadrant.  Patient's labs show mild LFT elevation highly suspicious of cholecystitis.  We will proceed with a right upper quadrant ultrasound to further evaluate, treat pain and nausea and IV hydrate well awaiting results.  Patient agreeable to plan of care.  Last ate around 11 AM this morning.  Ultrasound consistent with acute cholecystitis.  Discussed the patient with general surgery who will be admitting to their service.  They state they would likely not operate tonight but would operate tomorrow we will perform the Hologic corona test.  We will cover with Rocephin.  Kerry Webb was evaluated  in Emergency Department on 05/17/2019 for the symptoms described in the history of present illness. She was evaluated in the context of the global COVID-19 pandemic, which necessitated consideration that the patient might be at risk for infection with the SARS-CoV-2 virus that causes COVID-19. Institutional protocols and algorithms that pertain to the evaluation of patients at risk for COVID-19 are in a state of rapid change based on information released by regulatory bodies including the CDC and federal and state organizations. These policies and algorithms were followed during the patient's care in the ED.  ____________________________________________   FINAL CLINICAL IMPRESSION(S) / ED DIAGNOSES  Right upper quadrant abdominal pain Acute cholecystitis.   Harvest Dark, MD 05/17/19 2052

## 2019-05-17 NOTE — ED Notes (Signed)
Pt has abd pain with bloating today.  No n/v/d.  3 bm's today.  No back pain.  No vag bleeding. Pt states pain is all over abd.  Pt sent from urgent care for eval of abd pain. Pt alert  Speech clear.

## 2019-05-18 ENCOUNTER — Encounter
Admission: EM | Disposition: A | Discharge status: Home / Self Care | Payer: Self-pay | Source: Home / Self Care | Attending: Surgery

## 2019-05-18 ENCOUNTER — Inpatient Hospital Stay: Payer: BC Managed Care – PPO | Admitting: Certified Registered Nurse Anesthetist

## 2019-05-18 ENCOUNTER — Encounter: Payer: Self-pay | Admitting: Anesthesiology

## 2019-05-18 HISTORY — PX: CHOLECYSTECTOMY: SHX55

## 2019-05-18 LAB — CBC
HCT: 39.1 % (ref 36.0–46.0)
Hemoglobin: 12.7 g/dL (ref 12.0–15.0)
MCH: 28.2 pg (ref 26.0–34.0)
MCHC: 32.5 g/dL (ref 30.0–36.0)
MCV: 86.9 fL (ref 80.0–100.0)
Platelets: 273 10*3/uL (ref 150–400)
RBC: 4.5 MIL/uL (ref 3.87–5.11)
RDW: 13.3 % (ref 11.5–15.5)
WBC: 9.9 10*3/uL (ref 4.0–10.5)
nRBC: 0 % (ref 0.0–0.2)

## 2019-05-18 LAB — BASIC METABOLIC PANEL
Anion gap: 7 (ref 5–15)
BUN: 9 mg/dL (ref 6–20)
CO2: 26 mmol/L (ref 22–32)
Calcium: 8 mg/dL — ABNORMAL LOW (ref 8.9–10.3)
Chloride: 107 mmol/L (ref 98–111)
Creatinine, Ser: 0.57 mg/dL (ref 0.44–1.00)
GFR calc Af Amer: >60 mL/min (ref >60)
GFR calc non Af Amer: >60 mL/min (ref >60)
Glucose, Bld: 111 mg/dL — ABNORMAL HIGH (ref 70–99)
Potassium: 3 mmol/L — ABNORMAL LOW (ref 3.5–5.1)
Sodium: 140 mmol/L (ref 135–145)

## 2019-05-18 LAB — GLUCOSE, CAPILLARY
Glucose-Capillary: 140 mg/dL — ABNORMAL HIGH (ref 70–99)
Glucose-Capillary: 124 mg/dL — ABNORMAL HIGH (ref 70–99)
Glucose-Capillary: 101 mg/dL — ABNORMAL HIGH (ref 70–99)
Glucose-Capillary: 177 mg/dL — ABNORMAL HIGH (ref 70–99)
Glucose-Capillary: 185 mg/dL — ABNORMAL HIGH (ref 70–99)
Glucose-Capillary: 235 mg/dL — ABNORMAL HIGH (ref 70–99)

## 2019-05-18 LAB — HEMOGLOBIN A1C
Hgb A1c MFr Bld: 7.6 % — ABNORMAL HIGH (ref 4.8–5.6)
Mean Plasma Glucose: 171.42 mg/dL

## 2019-05-18 LAB — HEPATIC FUNCTION PANEL
ALT: 331 U/L — ABNORMAL HIGH (ref 0–44)
AST: 345 U/L — ABNORMAL HIGH (ref 15–41)
Albumin: 3.3 g/dL — ABNORMAL LOW (ref 3.5–5.0)
Alkaline Phosphatase: 132 U/L — ABNORMAL HIGH (ref 38–126)
Bilirubin, Direct: 0.1 mg/dL (ref 0.0–0.2)
Indirect Bilirubin: 0.7 mg/dL (ref 0.3–0.9)
Total Bilirubin: 0.8 mg/dL (ref 0.3–1.2)
Total Protein: 6.6 g/dL (ref 6.5–8.1)

## 2019-05-18 LAB — MAGNESIUM: Magnesium: 2 mg/dL (ref 1.7–2.4)

## 2019-05-18 LAB — PHOSPHORUS: Phosphorus: 3.3 mg/dL (ref 2.5–4.6)

## 2019-05-18 LAB — SURGICAL PCR SCREEN
MRSA, PCR: NEGATIVE
Staphylococcus aureus: NEGATIVE

## 2019-05-18 LAB — HIV ANTIBODY (ROUTINE TESTING W REFLEX): HIV Screen 4th Generation wRfx: NONREACTIVE

## 2019-05-18 LAB — SARS CORONAVIRUS 2 (TAT 6-24 HRS): SARS Coronavirus 2: NEGATIVE

## 2019-05-18 SURGERY — CHOLECYSTECTOMY, ROBOT-ASSISTED, LAPAROSCOPIC
Anesthesia: Choice

## 2019-05-18 SURGERY — LAPAROSCOPIC CHOLECYSTECTOMY
Anesthesia: General | Site: Abdomen

## 2019-05-18 MED ORDER — PROPOFOL 10 MG/ML IV BOLUS
INTRAVENOUS | Status: DC | PRN
  Administered 2019-05-18: 40 mg via INTRAVENOUS
  Administered 2019-05-18: 160 mg via INTRAVENOUS

## 2019-05-18 MED ORDER — DEXTROSE 50 % IV SOLN: 12.5000 g | INTRAVENOUS | Status: DC

## 2019-05-18 MED ORDER — FENTANYL CITRATE (PF) 250 MCG/5ML IJ SOLN
INTRAMUSCULAR | Status: DC | PRN
  Administered 2019-05-18: 25 ug via INTRAVENOUS
  Administered 2019-05-18: 50 ug via INTRAVENOUS
  Administered 2019-05-18: 100 ug via INTRAVENOUS
  Administered 2019-05-18: 25 ug via INTRAVENOUS

## 2019-05-18 MED ORDER — ONDANSETRON 4 MG PO TBDP: 4.0000 mg | ORAL_TABLET | Freq: Four times a day (QID) | ORAL | Status: DC | PRN

## 2019-05-18 MED ORDER — TOPIRAMATE 100 MG PO TABS
100.0000 mg | ORAL_TABLET | Freq: Two times a day (BID) | ORAL | Status: DC
  Filled 2019-05-18 (×2): qty 1

## 2019-05-18 MED ORDER — OXYCODONE HCL 5 MG PO TABS
ORAL_TABLET | ORAL | Status: AC
  Administered 2019-05-18: 5 mg via ORAL
  Filled 2019-05-18: qty 1

## 2019-05-18 MED ORDER — SUGAMMADEX SODIUM 200 MG/2ML IV SOLN
INTRAVENOUS | Status: AC
  Filled 2019-05-18: qty 2

## 2019-05-18 MED ORDER — MUPIROCIN 2 % EX OINT
1.0000 "application " | TOPICAL_OINTMENT | Freq: Two times a day (BID) | CUTANEOUS | Status: DC
  Filled 2019-05-18 (×2): qty 22

## 2019-05-18 MED ORDER — ROCURONIUM BROMIDE 50 MG/5ML IV SOLN
INTRAVENOUS | Status: AC
  Filled 2019-05-18: qty 1

## 2019-05-18 MED ORDER — LIDOCAINE-EPINEPHRINE 1 %-1:100000 IJ SOLN
INTRAMUSCULAR | Status: AC
  Filled 2019-05-18: qty 1

## 2019-05-18 MED ORDER — EPHEDRINE SULFATE 50 MG/ML IJ SOLN
INTRAMUSCULAR | Status: AC
  Filled 2019-05-18: qty 1

## 2019-05-18 MED ORDER — POTASSIUM CHLORIDE CRYS ER 20 MEQ PO TBCR
40.0000 meq | EXTENDED_RELEASE_TABLET | Freq: Once | ORAL | Status: AC
  Administered 2019-05-18: 40 meq via ORAL
  Filled 2019-05-18: qty 2

## 2019-05-18 MED ORDER — LACTATED RINGERS IV SOLN
INTRAVENOUS | Status: DC
  Administered 2019-05-18 (×2): via INTRAVENOUS

## 2019-05-18 MED ORDER — PHENYLEPHRINE HCL (PRESSORS) 10 MG/ML IV SOLN
INTRAVENOUS | Status: AC
  Filled 2019-05-18: qty 1

## 2019-05-18 MED ORDER — ONDANSETRON HCL 4 MG/2ML IJ SOLN
INTRAMUSCULAR | Status: DC | PRN
  Administered 2019-05-18: 4 mg via INTRAVENOUS

## 2019-05-18 MED ORDER — MIDAZOLAM HCL 2 MG/2ML IJ SOLN
INTRAMUSCULAR | Status: AC
  Filled 2019-05-18: qty 2

## 2019-05-18 MED ORDER — ONDANSETRON HCL 4 MG/2ML IJ SOLN
INTRAMUSCULAR | Status: AC
  Filled 2019-05-18: qty 2

## 2019-05-18 MED ORDER — LIDOCAINE HCL 4 % MT SOLN
OROMUCOSAL | Status: DC | PRN
  Administered 2019-05-18: 4 mL via TOPICAL

## 2019-05-18 MED ORDER — SODIUM CHLORIDE 0.9 % IV SOLN
2.0000 g | INTRAVENOUS | Status: DC
  Administered 2019-05-18 – 2019-05-19 (×2): 2 g via INTRAVENOUS
  Filled 2019-05-18: qty 20
  Filled 2019-05-18 (×2): qty 2

## 2019-05-18 MED ORDER — SUGAMMADEX SODIUM 200 MG/2ML IV SOLN
INTRAVENOUS | Status: DC | PRN
  Administered 2019-05-18: 200 mg via INTRAVENOUS

## 2019-05-18 MED ORDER — FENTANYL CITRATE (PF) 250 MCG/5ML IJ SOLN
INTRAMUSCULAR | Status: AC
  Filled 2019-05-18: qty 5

## 2019-05-18 MED ORDER — POTASSIUM CHLORIDE CRYS ER 20 MEQ PO TBCR
20.0000 meq | EXTENDED_RELEASE_TABLET | Freq: Every day | ORAL | Status: DC
  Administered 2019-05-18 – 2019-05-20 (×3): 20 meq via ORAL
  Filled 2019-05-18 (×4): qty 1

## 2019-05-18 MED ORDER — HYDROCHLOROTHIAZIDE 25 MG PO TABS
25.0000 mg | ORAL_TABLET | Freq: Every day | ORAL | Status: DC
  Administered 2019-05-18 – 2019-05-20 (×3): 25 mg via ORAL
  Filled 2019-05-18 (×3): qty 1

## 2019-05-18 MED ORDER — INSULIN ASPART 100 UNIT/ML ~~LOC~~ SOLN
0.0000 [IU] | Freq: Three times a day (TID) | SUBCUTANEOUS | Status: DC
  Administered 2019-05-18: 3 [IU] via SUBCUTANEOUS
  Administered 2019-05-18: 18:00:00 4 [IU] via SUBCUTANEOUS
  Administered 2019-05-19: 09:00:00 3 [IU] via SUBCUTANEOUS
  Administered 2019-05-19 – 2019-05-20 (×3): 4 [IU] via SUBCUTANEOUS
  Administered 2019-05-20: 3 [IU] via SUBCUTANEOUS
  Filled 2019-05-18 (×7): qty 1

## 2019-05-18 MED ORDER — OXYCODONE HCL 5 MG/5ML PO SOLN: 5.0000 mg | Freq: Once | ORAL | Status: AC | PRN

## 2019-05-18 MED ORDER — FENTANYL CITRATE (PF) 100 MCG/2ML IJ SOLN
INTRAMUSCULAR | Status: AC
  Administered 2019-05-18: 25 ug via INTRAVENOUS
  Filled 2019-05-18: qty 2

## 2019-05-18 MED ORDER — TRAMADOL HCL 50 MG PO TABS: 50.0000 mg | ORAL_TABLET | Freq: Four times a day (QID) | ORAL | Status: DC | PRN

## 2019-05-18 MED ORDER — HYDROCODONE-ACETAMINOPHEN 5-325 MG PO TABS
1.0000 | ORAL_TABLET | ORAL | Status: DC | PRN
  Administered 2019-05-19: 2 via ORAL
  Administered 2019-05-19: 1 via ORAL
  Filled 2019-05-18: qty 2
  Filled 2019-05-18: qty 1

## 2019-05-18 MED ORDER — ALBUTEROL SULFATE (2.5 MG/3ML) 0.083% IN NEBU: 2.5000 mg | INHALATION_SOLUTION | Freq: Four times a day (QID) | RESPIRATORY_TRACT | Status: DC | PRN

## 2019-05-18 MED ORDER — OXYCODONE HCL 5 MG PO TABS
5.0000 mg | ORAL_TABLET | Freq: Once | ORAL | Status: AC | PRN
  Administered 2019-05-18: 16:00:00 5 mg via ORAL

## 2019-05-18 MED ORDER — MEPERIDINE HCL 50 MG/ML IJ SOLN: 6.2500 mg | INTRAMUSCULAR | Status: DC | PRN

## 2019-05-18 MED ORDER — PROMETHAZINE HCL 25 MG/ML IJ SOLN: 6.2500 mg | INTRAMUSCULAR | Status: DC | PRN

## 2019-05-18 MED ORDER — EPHEDRINE SULFATE 50 MG/ML IJ SOLN
INTRAMUSCULAR | Status: DC | PRN
  Administered 2019-05-18 (×2): 10 mg via INTRAVENOUS
  Administered 2019-05-18: 7.5 mg via INTRAVENOUS

## 2019-05-18 MED ORDER — LIDOCAINE HCL (PF) 2 % IJ SOLN
INTRAMUSCULAR | Status: AC
  Filled 2019-05-18: qty 10

## 2019-05-18 MED ORDER — DOCUSATE SODIUM 100 MG PO CAPS: 100.0000 mg | ORAL_CAPSULE | Freq: Two times a day (BID) | ORAL | Status: DC | PRN

## 2019-05-18 MED ORDER — ENOXAPARIN SODIUM 40 MG/0.4ML ~~LOC~~ SOLN
40.0000 mg | SUBCUTANEOUS | Status: DC
  Administered 2019-05-19 – 2019-05-20 (×2): 40 mg via SUBCUTANEOUS
  Filled 2019-05-18 (×2): qty 0.4

## 2019-05-18 MED ORDER — FENTANYL CITRATE (PF) 100 MCG/2ML IJ SOLN
25.0000 ug | INTRAMUSCULAR | Status: DC | PRN
  Administered 2019-05-18 (×3): 25 ug via INTRAVENOUS

## 2019-05-18 MED ORDER — LIDOCAINE-EPINEPHRINE 1 %-1:100000 IJ SOLN
INTRAMUSCULAR | Status: DC | PRN
  Administered 2019-05-18: 15 mL via INTRAMUSCULAR

## 2019-05-18 MED ORDER — ONDANSETRON HCL 4 MG/2ML IJ SOLN
4.0000 mg | Freq: Four times a day (QID) | INTRAMUSCULAR | Status: DC | PRN
  Administered 2019-05-19: 4 mg via INTRAVENOUS
  Filled 2019-05-18 (×2): qty 2

## 2019-05-18 MED ORDER — TOPIRAMATE 100 MG PO TABS
100.0000 mg | ORAL_TABLET | Freq: Every day | ORAL | Status: DC
  Administered 2019-05-19 – 2019-05-20 (×2): 100 mg via ORAL
  Filled 2019-05-18 (×2): qty 1

## 2019-05-18 MED ORDER — ACETAMINOPHEN NICU IV SYRINGE 10 MG/ML
INTRAVENOUS | Status: AC
  Filled 2019-05-18: qty 1

## 2019-05-18 MED ORDER — PROPOFOL 10 MG/ML IV BOLUS
INTRAVENOUS | Status: AC
  Filled 2019-05-18: qty 20

## 2019-05-18 MED ORDER — LIDOCAINE HCL (CARDIAC) PF 100 MG/5ML IV SOSY
PREFILLED_SYRINGE | INTRAVENOUS | Status: DC | PRN
  Administered 2019-05-18: 80 mg via INTRATRACHEAL

## 2019-05-18 MED ORDER — MIDAZOLAM HCL 2 MG/2ML IJ SOLN
INTRAMUSCULAR | Status: DC | PRN
  Administered 2019-05-18: 2 mg via INTRAVENOUS

## 2019-05-18 MED ORDER — BUPROPION HCL ER (XL) 150 MG PO TB24
150.0000 mg | ORAL_TABLET | Freq: Every day | ORAL | Status: DC
  Administered 2019-05-18 – 2019-05-20 (×3): 150 mg via ORAL
  Filled 2019-05-18 (×3): qty 1

## 2019-05-18 MED ORDER — DEXAMETHASONE SODIUM PHOSPHATE 10 MG/ML IJ SOLN
INTRAMUSCULAR | Status: DC | PRN
  Administered 2019-05-18: 10 mg via INTRAVENOUS

## 2019-05-18 MED ORDER — ACETAMINOPHEN 10 MG/ML IV SOLN
INTRAVENOUS | Status: DC | PRN
  Administered 2019-05-18: 1000 mg via INTRAVENOUS

## 2019-05-18 MED ORDER — DEXAMETHASONE SODIUM PHOSPHATE 10 MG/ML IJ SOLN
INTRAMUSCULAR | Status: AC
  Filled 2019-05-18: qty 1

## 2019-05-18 MED ORDER — PANTOPRAZOLE SODIUM 40 MG IV SOLR
40.0000 mg | Freq: Every day | INTRAVENOUS | Status: DC
  Administered 2019-05-18 – 2019-05-19 (×3): 40 mg via INTRAVENOUS
  Filled 2019-05-18 (×3): qty 40

## 2019-05-18 MED ORDER — BUSPIRONE HCL 15 MG PO TABS
22.5000 mg | ORAL_TABLET | Freq: Every day | ORAL | Status: DC
  Filled 2019-05-18: qty 2

## 2019-05-18 MED ORDER — SEVOFLURANE IN SOLN
RESPIRATORY_TRACT | Status: AC
  Filled 2019-05-18: qty 250

## 2019-05-18 MED ORDER — BUPIVACAINE HCL (PF) 0.5 % IJ SOLN
INTRAMUSCULAR | Status: AC
  Filled 2019-05-18: qty 30

## 2019-05-18 MED ORDER — ROCURONIUM BROMIDE 100 MG/10ML IV SOLN
INTRAVENOUS | Status: DC | PRN
  Administered 2019-05-18: 50 mg via INTRAVENOUS
  Administered 2019-05-18: 20 mg via INTRAVENOUS

## 2019-05-18 MED ORDER — FLUTICASONE PROPIONATE 50 MCG/ACT NA SUSP
1.0000 | Freq: Two times a day (BID) | NASAL | Status: DC | PRN
  Filled 2019-05-18: qty 16

## 2019-05-18 MED ORDER — SERTRALINE HCL 50 MG PO TABS
100.0000 mg | ORAL_TABLET | Freq: Every day | ORAL | Status: DC
  Administered 2019-05-18 – 2019-05-20 (×3): 100 mg via ORAL
  Filled 2019-05-18 (×3): qty 2

## 2019-05-18 MED ORDER — ATORVASTATIN CALCIUM 20 MG PO TABS: 40.0000 mg | ORAL_TABLET | Freq: Every day | ORAL | Status: DC

## 2019-05-18 MED ORDER — MORPHINE SULFATE (PF) 2 MG/ML IV SOLN
2.0000 mg | INTRAVENOUS | Status: DC | PRN
  Administered 2019-05-18: 2 mg via INTRAVENOUS
  Filled 2019-05-18: qty 1

## 2019-05-18 MED ORDER — PHENYLEPHRINE HCL (PRESSORS) 10 MG/ML IV SOLN
INTRAVENOUS | Status: DC | PRN
  Administered 2019-05-18: 150 ug via INTRAVENOUS
  Administered 2019-05-18: 100 ug via INTRAVENOUS

## 2019-05-18 SURGICAL SUPPLY — 62 items
ADH SKN CLS APL DERMABOND .7 (GAUZE/BANDAGES/DRESSINGS) ×1
ANCHOR TIS RET SYS 235ML (MISCELLANEOUS) ×3 IMPLANT
APL PRP STRL LF DISP 70% ISPRP (MISCELLANEOUS) ×1
BAG INFUSER PRESSURE 100CC (MISCELLANEOUS) ×3 IMPLANT
BAG TISS RTRVL C235 10X14 (MISCELLANEOUS) ×1
BLADE SURG SZ11 CARB STEEL (BLADE) ×3 IMPLANT
CANISTER SUCT 1200ML W/VALVE (MISCELLANEOUS) ×3 IMPLANT
CANNULA REDUC XI 12-8 STAPL (CANNULA) ×1
CANNULA REDUCER 12-8 DVNC XI (CANNULA) ×2 IMPLANT
CHLORAPREP W/TINT 26 (MISCELLANEOUS) ×3 IMPLANT
CLIP VESOLOCK MED LG 6/CT (CLIP) ×3 IMPLANT
COVER TIP SHEARS 8 DVNC (MISCELLANEOUS) ×2 IMPLANT
COVER TIP SHEARS 8MM DA VINCI (MISCELLANEOUS) ×1
COVER WAND RF STERILE (DRAPES) ×3 IMPLANT
DECANTER SPIKE VIAL GLASS SM (MISCELLANEOUS) ×6 IMPLANT
DEFOGGER SCOPE WARMER CLEARIFY (MISCELLANEOUS) ×3 IMPLANT
DERMABOND ADVANCED (GAUZE/BANDAGES/DRESSINGS) ×1
DERMABOND ADVANCED .7 DNX12 (GAUZE/BANDAGES/DRESSINGS) ×2 IMPLANT
DRAPE 3/4 80X56 (DRAPES) ×3 IMPLANT
DRAPE ARM DVNC X/XI (DISPOSABLE) ×8 IMPLANT
DRAPE COLUMN DVNC XI (DISPOSABLE) ×2 IMPLANT
DRAPE DA VINCI XI ARM (DISPOSABLE) ×4
DRAPE DA VINCI XI COLUMN (DISPOSABLE) ×1
ELECT CAUTERY BLADE 6.4 (BLADE) ×3 IMPLANT
ELECT REM PT RETURN 9FT ADLT (ELECTROSURGICAL) ×2
ELECTRODE REM PT RTRN 9FT ADLT (ELECTROSURGICAL) ×2 IMPLANT
GLOVE BIOGEL PI IND STRL 7.0 (GLOVE) ×4 IMPLANT
GLOVE BIOGEL PI INDICATOR 7.0 (GLOVE) ×2
GLOVE SURG SYN 6.5 ES PF (GLOVE) ×4 IMPLANT
GLOVE SURG SYN 6.5 PF PI (GLOVE) ×2 IMPLANT
GOWN STRL REUS W/ TWL LRG LVL3 (GOWN DISPOSABLE) ×6 IMPLANT
GOWN STRL REUS W/TWL LRG LVL3 (GOWN DISPOSABLE) ×6
GRASPER SUT TROCAR 14GX15 (MISCELLANEOUS) ×3 IMPLANT
IRRIGATOR SUCT 8 DISP DVNC XI (IRRIGATION / IRRIGATOR) ×2 IMPLANT
IRRIGATOR SUCTION 8MM XI DISP (IRRIGATION / IRRIGATOR) ×1
IV NS 1000ML (IV SOLUTION)
IV NS 1000ML BAXH (IV SOLUTION) IMPLANT
KIT PINK PAD W/HEAD ARE REST (MISCELLANEOUS) ×2
KIT PINK PAD W/HEAD ARM REST (MISCELLANEOUS) ×2 IMPLANT
LABEL OR SOLS (LABEL) ×3 IMPLANT
NEEDLE HYPO 22GX1.5 SAFETY (NEEDLE) ×3 IMPLANT
NEEDLE VERESS 14GA 120MM (NEEDLE) ×3 IMPLANT
NS IRRIG 500ML POUR BTL (IV SOLUTION) ×3 IMPLANT
OBTURATOR OPTICAL STANDARD 8MM (TROCAR) ×1
OBTURATOR OPTICAL STND 8 DVNC (TROCAR) ×1
OBTURATOR OPTICALSTD 8 DVNC (TROCAR) ×2 IMPLANT
PACK LAP CHOLECYSTECTOMY (MISCELLANEOUS) ×3 IMPLANT
PENCIL ELECTRO HAND CTR (MISCELLANEOUS) ×3 IMPLANT
SEAL CANN UNIV 5-8 DVNC XI (MISCELLANEOUS) ×6 IMPLANT
SEAL XI 5MM-8MM UNIVERSAL (MISCELLANEOUS) ×3
SOLUTION ELECTROLUBE (MISCELLANEOUS) ×3 IMPLANT
STAPLER CANNULA SEAL DVNC XI (STAPLE) ×2 IMPLANT
STAPLER CANNULA SEAL XI (STAPLE) ×1
SUT MNCRL 4-0 (SUTURE) ×2
SUT MNCRL 4-0 27XMFL (SUTURE) ×1
SUT VIC AB 3-0 SH 27 (SUTURE) ×2
SUT VIC AB 3-0 SH 27X BRD (SUTURE) ×2 IMPLANT
SUT VICRYL 0 AB UR-6 (SUTURE) ×3 IMPLANT
SUTURE MNCRL 4-0 27XMF (SUTURE) ×2 IMPLANT
SYR 20ML LL LF (SYRINGE) ×3 IMPLANT
TROCAR XCEL NON-BLD 5MMX100MML (ENDOMECHANICALS) ×3 IMPLANT
TUBING EVAC SMOKE HEATED PNEUM (TUBING) ×3 IMPLANT

## 2019-05-18 SURGICAL SUPPLY — 62 items
ADH SKN CLS APL DERMABOND .7 (GAUZE/BANDAGES/DRESSINGS) ×1
ANCHOR TIS RET SYS 235ML (MISCELLANEOUS) ×2 IMPLANT
APL PRP STRL LF DISP 70% ISPRP (MISCELLANEOUS) ×1
APPLIER CLIP 5 13 M/L LIGAMAX5 (MISCELLANEOUS) ×2
APR CLP MED LRG 5 ANG JAW (MISCELLANEOUS) ×1
BAG TISS RTRVL C235 10X14 (MISCELLANEOUS) ×1
BLADE SURG SZ11 CARB STEEL (BLADE) ×2 IMPLANT
CANISTER SUCT 1200ML W/VALVE (MISCELLANEOUS) ×2 IMPLANT
CHLORAPREP W/TINT 26 (MISCELLANEOUS) ×2 IMPLANT
CHOLANGIOGRAM CATH TAUT (CATHETERS) IMPLANT
CLIP APPLIE 5 13 M/L LIGAMAX5 (MISCELLANEOUS) ×1 IMPLANT
COVER WAND RF STERILE (DRAPES) ×2 IMPLANT
DECANTER SPIKE VIAL GLASS SM (MISCELLANEOUS) ×4 IMPLANT
DEFOGGER SCOPE WARMER CLEARIFY (MISCELLANEOUS) ×2 IMPLANT
DERMABOND ADVANCED (GAUZE/BANDAGES/DRESSINGS) ×1
DERMABOND ADVANCED .7 DNX12 (GAUZE/BANDAGES/DRESSINGS) ×1 IMPLANT
DISSECTOR BLUNT TIP ENDO 5MM (MISCELLANEOUS) IMPLANT
DISSECTOR KITTNER STICK (MISCELLANEOUS) IMPLANT
DISSECTORS/KITTNER STICK (MISCELLANEOUS)
DRAPE 3/4 80X56 (DRAPES) IMPLANT
DRAPE C-ARM XRAY 36X54 (DRAPES) IMPLANT
ELECT CAUTERY BLADE 6.4 (BLADE) ×2 IMPLANT
ELECT REM PT RETURN 9FT ADLT (ELECTROSURGICAL) ×2
ELECTRODE REM PT RTRN 9FT ADLT (ELECTROSURGICAL) ×1 IMPLANT
GLOVE BIOGEL PI IND STRL 7.0 (GLOVE) ×1 IMPLANT
GLOVE BIOGEL PI INDICATOR 7.0 (GLOVE) ×5
GLOVE SURG SYN 6.5 ES PF (GLOVE) ×2 IMPLANT
GLOVE SURG SYN 6.5 PF PI (GLOVE) ×1 IMPLANT
GOWN STRL REUS W/ TWL LRG LVL3 (GOWN DISPOSABLE) ×3 IMPLANT
GOWN STRL REUS W/TWL LRG LVL3 (GOWN DISPOSABLE) ×6
GRASPER SUT TROCAR 14GX15 (MISCELLANEOUS) ×2 IMPLANT
IRRIGATION STRYKERFLOW (MISCELLANEOUS) IMPLANT
IRRIGATOR STRYKERFLOW (MISCELLANEOUS) ×2
IV CATH ANGIO 12GX3 LT BLUE (NEEDLE) IMPLANT
IV NS 1000ML (IV SOLUTION) ×2
IV NS 1000ML BAXH (IV SOLUTION) IMPLANT
JACKSON PRATT 10 (INSTRUMENTS) IMPLANT
L-HOOK LAP DISP 36CM (ELECTROSURGICAL) ×2
LABEL OR SOLS (LABEL) ×2 IMPLANT
LHOOK LAP DISP 36CM (ELECTROSURGICAL) ×1 IMPLANT
NDL INSUFFLATION 14GA 120MM (NEEDLE) IMPLANT
NEEDLE HYPO 22GX1.5 SAFETY (NEEDLE) ×2 IMPLANT
NEEDLE INSUFFLATION 14GA 120MM (NEEDLE) IMPLANT
PACK LAP CHOLECYSTECTOMY (MISCELLANEOUS) ×2 IMPLANT
PENCIL ELECTRO HAND CTR (MISCELLANEOUS) ×2 IMPLANT
PORT ACCESS TROCAR AIRSEAL 5 (TROCAR) ×2 IMPLANT
SCISSORS METZENBAUM CVD 33 (INSTRUMENTS) ×2 IMPLANT
SET TRI-LUMEN FLTR TB AIRSEAL (TUBING) ×2 IMPLANT
SLEEVE ENDOPATH XCEL 5M (ENDOMECHANICALS) ×2 IMPLANT
SPONGE LAP 18X18 RF (DISPOSABLE) ×1 IMPLANT
STOPCOCK 4 WAY LG BORE MALE ST (IV SETS) IMPLANT
SUT MNCRL 4-0 (SUTURE) ×6
SUT MNCRL 4-0 27XMFL (SUTURE) ×3
SUT VIC AB 0 CT2 27 (SUTURE) ×1 IMPLANT
SUT VIC AB 3-0 SH 27 (SUTURE)
SUT VIC AB 3-0 SH 27X BRD (SUTURE) IMPLANT
SUT VICRYL 0 AB UR-6 (SUTURE) ×4 IMPLANT
SUTURE MNCRL 4-0 27XMF (SUTURE) ×1 IMPLANT
SYR 20ML LL LF (SYRINGE) ×2 IMPLANT
TROCAR XCEL BLUNT TIP 100MML (ENDOMECHANICALS) ×2 IMPLANT
TROCAR XCEL NON-BLD 5MMX100MML (ENDOMECHANICALS) ×2 IMPLANT
WATER STERILE IRR 1000ML POUR (IV SOLUTION) ×2 IMPLANT

## 2019-05-18 NOTE — Interval H&P Note (Signed)
History and Physical Interval Note:  05/18/2019 12:03 PM  Kerry Webb  has presented today for surgery, with the diagnosis of N/A.  The various methods of treatment have been discussed with the patient and family. After consideration of risks, benefits and other options for treatment, the patient has consented to  Procedure(s): LAPAROSCOPIC CHOLECYSTECTOMY (N/A) as a surgical intervention.  The patient's history has been reviewed, patient examined, no change in status, stable for surgery.  I have reviewed the patient's chart and labs.  Questions were answered to the patient's satisfaction.     Thom Ollinger Lysle Pearl

## 2019-05-18 NOTE — Anesthesia Procedure Notes (Signed)
Procedure Name: Intubation Date/Time: 05/18/2019 12:53 PM Performed by: Eben Burow, CRNA Pre-anesthesia Checklist: Patient identified, Emergency Drugs available, Patient being monitored and Suction available Patient Re-evaluated:Patient Re-evaluated prior to induction Oxygen Delivery Method: Circle system utilized Preoxygenation: Pre-oxygenation with 100% oxygen Induction Type: IV induction Ventilation: Mask ventilation without difficulty and Oral airway inserted - appropriate to patient size Laryngoscope Size: 3 and McGraph Grade View: Grade I Tube type: Oral Tube size: 7.0 mm Number of attempts: 1 Airway Equipment and Method: Stylet,  Video-laryngoscopy,  Oral airway and LTA kit utilized Placement Confirmation: ETT inserted through vocal cords under direct vision,  positive ETCO2 and breath sounds checked- equal and bilateral Secured at: 20 cm Tube secured with: Tape Dental Injury: Teeth and Oropharynx as per pre-operative assessment

## 2019-05-18 NOTE — Op Note (Signed)
Preoperative diagnosis:  acute and cholecystitis  Postoperative diagnosis: same as above  Procedure: Laparoscopic Cholecystectomy.   Anesthesia: GETA   Surgeon: Benjamine Sprague  Specimen: Gallbladder  Complications: None  EBL: 31mL  Wound Classification: Clean Contaminated  Indications: see HPI  Findings: Critical view of safety noted Cystic duct and artery identified, ligated and divided, clips remained intact at end of procedure Adequate hemostasis  Description of procedure: The patient was placed on the operating table in the supine position. SCDs placed, pre-op abx administered.  General anesthesia was induced and OG tube placed by anesthesia. A time-out was completed verifying correct patient, procedure, site, positioning, and implant(s) and/or special equipment prior to beginning this procedure. The abdomen was prepped and draped in the usual sterile fashion.   Veress needle inserted at palmer's point and saline drop test confirmed prior to starting gas insufflation without dramatic increase in pressure.  After 40mm Hg, achieved, veress needle removed and 63mm port placed same location via optiview techinque.  After confirming no injury to surrounding area,  An incision was made in a natural skin line under the umbilicus and umbilical hernia defect entered under direct visualization and Hasson port placed  Additional trocars were then inserted under direct visualization in the following locations: two 5-mm trocars along the right costal margin. The abdomen was inspected and no abnormalities or injuries were found. The table was placed in the reverse Trendelenburg position with the right side up.  Filmy adhesions between the gallbladder and omentum, duodenum and transverse colon were lysed sharply. The dome of the gallbladder was grasped with an atraumatic grasper passed through the lateral port and retracted over the dome of the liver. The infundibulum was also grasped with an  atraumatic grasper and retracted toward the right lower quadrant. This maneuver exposed Calot's triangle. The thickened and edematous peritoneum overlying the gallbladder infundibulum was then meticulously dissected and the cystic duct and cystic artery eventually identified.  Critical view of safety with the liver bed clearly visible behind the duct and artery with no additional structures noted.  Cystic duct and cystic artery clipped and divided close to the gallbladder.  The gallbladder was then dissected from its peritoneal and liver bed attachments by electrocautery. Hemostasis was checked and the gallbladder was removed using an endoscopic retrieval bag placed through the umbilical port. The gallbladder was passed off the table as a specimen. The gallbladder fossa was copiously irrigated with saline and leaked bile from couple holes placed through the thinned and inflammed wall was suctioned out, and hemostasis was obtained. There was no evidence of bleeding from the gallbladder fossa or cystic artery or leakage of the bile from the cystic duct stump. The umbilical trocar removed and port site closed with PMI using 0 vicryl under direct vision.  Abdomen desufflated and secondary trocars were removed under direct vision. No bleeding was noted.  All skin incisions then closed with subcuticular sutures of 4-0 monocryl and dressed with topical skin adhesive. The orogastric tube was removed and patient extubated. The patient tolerated the procedure well and was taken to the postanesthesia care unit in stable condition.  All sponge and instrument count correct at end of procedure.

## 2019-05-18 NOTE — Anesthesia Postprocedure Evaluation (Signed)
Anesthesia Post Note  Patient: Kerry Webb  Procedure(s) Performed: LAPAROSCOPIC CHOLECYSTECTOMY (N/A Abdomen)  Patient location during evaluation: PACU Anesthesia Type: General Level of consciousness: awake and alert and oriented Pain management: pain level controlled Vital Signs Assessment: post-procedure vital signs reviewed and stable Respiratory status: spontaneous breathing Cardiovascular status: blood pressure returned to baseline Anesthetic complications: no     Last Vitals:  Vitals:   05/18/19 1146 05/18/19 1522  BP: (!) 164/96 117/72  Pulse: 83 88  Resp: 18 15  Temp: 36.5 C 36.6 C  SpO2: 100% 98%    Last Pain:  Vitals:   05/18/19 1522  TempSrc: Temporal  PainSc: 0-No pain                 Keymari Sato

## 2019-05-18 NOTE — Anesthesia Post-op Follow-up Note (Signed)
Anesthesia QCDR form completed.        

## 2019-05-18 NOTE — Transfer of Care (Signed)
Immediate Anesthesia Transfer of Care Note  Patient: Kerry Webb  Procedure(s) Performed: LAPAROSCOPIC CHOLECYSTECTOMY (N/A Abdomen)  Patient Location: PACU  Anesthesia Type:General  Level of Consciousness: sedated and patient cooperative  Airway & Oxygen Therapy: Patient Spontanous Breathing and Patient connected to face mask oxygen  Post-op Assessment: Report given to RN and Post -op Vital signs reviewed and stable  Post vital signs: Reviewed and stable  Last Vitals:  Vitals Value Taken Time  BP 117/72 05/18/19 1522  Temp 36.6 C 05/18/19 1522  Pulse 91 05/18/19 1523  Resp 15 05/18/19 1523  SpO2 99 % 05/18/19 1523  Vitals shown include unvalidated device data.  Last Pain:  Vitals:   05/18/19 1522  TempSrc: Temporal  PainSc: 0-No pain      Patients Stated Pain Goal: 0 ([id number])  Complications: No apparent anesthesia complications

## 2019-05-18 NOTE — ED Notes (Signed)
Report called to candice rn floor nurse

## 2019-05-18 NOTE — Anesthesia Preprocedure Evaluation (Signed)
Anesthesia Evaluation  Patient identified by MRN, date of birth, ID band Patient awake    Reviewed: Allergy & Precautions, NPO status , Patient's Chart, lab work & pertinent test results  History of Anesthesia Complications Negative for: history of anesthetic complications  Airway Mallampati: II  TM Distance: >3 FB Neck ROM: Full    Dental no notable dental hx.    Pulmonary neg pulmonary ROS, neg sleep apnea, neg COPD,    breath sounds clear to auscultation- rhonchi (-) wheezing      Cardiovascular hypertension, Pt. on medications (-) CAD, (-) Past MI, (-) Cardiac Stents and (-) CABG  Rhythm:Regular Rate:Normal - Systolic murmurs and - Diastolic murmurs    Neuro/Psych Seizures -, Well Controlled,  negative psych ROS   GI/Hepatic Neg liver ROS, GERD  ,  Endo/Other  diabetes, Oral Hypoglycemic Agents  Renal/GU negative Renal ROS     Musculoskeletal negative musculoskeletal ROS (+)   Abdominal (+) + obese,   Peds  Hematology negative hematology ROS (+)   Anesthesia Other Findings Past Medical History: No date: Diabetes mellitus without complication ([facility]) No date: GERD (gastroesophageal reflux disease) 1967: Heart murmur 1993: Hypertension No date: Lump or mass in breast     Comment:  Left 1983: Seizures ([place]) 1985: Ulcer   Reproductive/Obstetrics                             Anesthesia Physical Anesthesia Plan  ASA: II  Anesthesia Plan: General   Post-op Pain Management:    Induction: Intravenous  PONV Risk Score and Plan: 2 and Ondansetron, Dexamethasone and Midazolam  Airway Management Planned: Oral ETT  Additional Equipment:   Intra-op Plan:   Post-operative Plan: Extubation in OR  Informed Consent: I have reviewed the patients History and Physical, chart, labs and discussed the procedure including the risks, benefits and alternatives for the proposed anesthesia with  the patient or authorized representative who has indicated his/her understanding and acceptance.     Dental advisory given  Plan Discussed with: CRNA and Anesthesiologist  Anesthesia Plan Comments:         Anesthesia Quick Evaluation

## 2019-05-19 ENCOUNTER — Encounter: Payer: Self-pay | Admitting: Surgery

## 2019-05-19 LAB — HEPATIC FUNCTION PANEL
ALT: 249 U/L — ABNORMAL HIGH (ref 0–44)
AST: 110 U/L — ABNORMAL HIGH (ref 15–41)
Albumin: 3.2 g/dL — ABNORMAL LOW (ref 3.5–5.0)
Alkaline Phosphatase: 137 U/L — ABNORMAL HIGH (ref 38–126)
Bilirubin, Direct: <0.1 mg/dL (ref 0.0–0.2)
Total Bilirubin: 0.6 mg/dL (ref 0.3–1.2)
Total Protein: 6.5 g/dL (ref 6.5–8.1)

## 2019-05-19 LAB — BASIC METABOLIC PANEL
Anion gap: 10 (ref 5–15)
BUN: 8 mg/dL (ref 6–20)
CO2: 24 mmol/L (ref 22–32)
Calcium: 7.7 mg/dL — ABNORMAL LOW (ref 8.9–10.3)
Chloride: 104 mmol/L (ref 98–111)
Creatinine, Ser: 0.71 mg/dL (ref 0.44–1.00)
GFR calc Af Amer: >60 mL/min (ref >60)
GFR calc non Af Amer: >60 mL/min (ref >60)
Glucose, Bld: 162 mg/dL — ABNORMAL HIGH (ref 70–99)
Potassium: 2.8 mmol/L — ABNORMAL LOW (ref 3.5–5.1)
Sodium: 138 mmol/L (ref 135–145)

## 2019-05-19 LAB — CBC
HCT: 34.3 % — ABNORMAL LOW (ref 36.0–46.0)
Hemoglobin: 11.7 g/dL — ABNORMAL LOW (ref 12.0–15.0)
MCH: 28.3 pg (ref 26.0–34.0)
MCHC: 34.1 g/dL (ref 30.0–36.0)
MCV: 82.9 fL (ref 80.0–100.0)
Platelets: 279 10*3/uL (ref 150–400)
RBC: 4.14 MIL/uL (ref 3.87–5.11)
RDW: 13.3 % (ref 11.5–15.5)
WBC: 13.5 10*3/uL — ABNORMAL HIGH (ref 4.0–10.5)
nRBC: 0 % (ref 0.0–0.2)

## 2019-05-19 LAB — MAGNESIUM: Magnesium: 2 mg/dL (ref 1.7–2.4)

## 2019-05-19 LAB — GLUCOSE, CAPILLARY
Glucose-Capillary: 122 mg/dL — ABNORMAL HIGH (ref 70–99)
Glucose-Capillary: 161 mg/dL — ABNORMAL HIGH (ref 70–99)
Glucose-Capillary: 162 mg/dL — ABNORMAL HIGH (ref 70–99)
Glucose-Capillary: 192 mg/dL — ABNORMAL HIGH (ref 70–99)

## 2019-05-19 LAB — PHOSPHORUS: Phosphorus: 2.9 mg/dL (ref 2.5–4.6)

## 2019-05-19 MED ORDER — DOCUSATE SODIUM 100 MG PO CAPS: 100.0000 mg | ORAL_CAPSULE | Freq: Two times a day (BID) | ORAL | 0 refills | Status: AC | PRN

## 2019-05-19 MED ORDER — POTASSIUM CHLORIDE CRYS ER 20 MEQ PO TBCR
40.0000 meq | EXTENDED_RELEASE_TABLET | Freq: Once | ORAL | Status: AC
  Administered 2019-05-19: 40 meq via ORAL
  Filled 2019-05-19: qty 2

## 2019-05-19 MED ORDER — HYDROCODONE-ACETAMINOPHEN 5-325 MG PO TABS: 1.0000 | ORAL_TABLET | Freq: Four times a day (QID) | ORAL | 0 refills | Status: DC | PRN

## 2019-05-19 MED ORDER — IBUPROFEN 800 MG PO TABS: 800.0000 mg | ORAL_TABLET | Freq: Three times a day (TID) | ORAL | 0 refills | Status: DC | PRN

## 2019-05-19 MED ORDER — ACETAMINOPHEN 325 MG PO TABS: 650.0000 mg | ORAL_TABLET | Freq: Three times a day (TID) | ORAL | 0 refills | Status: AC | PRN

## 2019-05-19 NOTE — Discharge Summary (Signed)
Physician Discharge Summary  Patient ID: Kerry Webb MRN: [id number] DOB/AGE: 1966-03-10 53 y.o.  Admit date: 05/17/2019 Discharge date: 05/19/2019  Admission Diagnoses: acute cholecystitis  Discharge Diagnoses:  Same as above  Discharged Condition: good  Hospital Course: admitted for above.  Underwent lap chole.  See op note for details.  Recovered without any issues.  Tolerating diet, pain controlled at time of d/c, labs downtrending  Consults: None  Discharge Exam: Blood pressure (!) 142/86, pulse 80, temperature 98.4 F (36.9 C), temperature source Oral, resp. rate 18, height 5\' 4"  (1.626 m), weight 86 kg, SpO2 98 %. General appearance: alert, cooperative and no distress GI: soft, no guarding, TTP around incisions which are c/d/i. no peritonitis  Disposition:  Discharge disposition: 01-Home or Self Care       Discharge Instructions    Discharge patient   Complete by: As directed    Discharge disposition: 01-Home or Self Care   Discharge patient date: 05/19/2019     Allergies as of 05/19/2019      Reactions   Cashew Nut Oil Shortness Of Breath   Shellfish Allergy Shortness Of Breath   Asa [aspirin]    Intestinal bleeding      Medication List    STOP taking these medications   albuterol 108 (90 Base) MCG/ACT inhaler Commonly known as: VENTOLIN HFA   atorvastatin 40 MG tablet Commonly known as: LIPITOR   BIOTIN PO   buPROPion 150 MG 24 hr tablet Commonly known as: WELLBUTRIN XL   busPIRone 7.5 MG tablet Commonly known as: BUSPAR   Fish Oil 1000 MG Caps   fluticasone 50 MCG/ACT nasal spray Commonly known as: FLONASE   GINKGO BILOBA PO   hydrochlorothiazide 25 MG tablet Commonly known as: HYDRODIURIL   Klor-Con M20 20 MEQ tablet Generic drug: potassium chloride SA   metFORMIN 500 MG 24 hr tablet Commonly known as: GLUCOPHAGE-XR   omeprazole 20 MG capsule Commonly known as: PRILOSEC   sertraline 100 MG tablet Commonly known as:  ZOLOFT   sitaGLIPtin-metformin 50-500 MG tablet Commonly known as: JANUMET   topiramate 100 MG tablet Commonly known as: TOPAMAX     TAKE these medications   acetaminophen 325 MG tablet Commonly known as: Tylenol Take 2 tablets (650 mg total) by mouth every 8 (eight) hours as needed for mild pain.   docusate sodium 100 MG capsule Commonly known as: Colace Take 1 capsule (100 mg total) by mouth 2 (two) times daily as needed for up to 10 days for mild constipation.   HYDROcodone-acetaminophen 5-325 MG tablet Commonly known as: Norco Take 1 tablet by mouth every 6 (six) hours as needed for up to 6 doses for moderate pain.   ibuprofen 800 MG tablet Commonly known as: ADVIL Take 1 tablet (800 mg total) by mouth every 8 (eight) hours as needed for mild pain or moderate pain.      Follow-up Information    Lysle Pearl, Junell Cullifer, DO Follow up in 2 week(s).   Specialty: Surgery Contact information: [place] [address] [phone]            Total time spent arranging discharge was >71min. Signed: Benjamine Sprague 05/19/2019, 7:24 AM

## 2019-05-19 NOTE — Discharge Instructions (Signed)
Laparoscopic Cholecystectomy, Care After This sheet gives you information about how to care for yourself after your procedure. Your doctor may also give you more specific instructions. If you have problems or questions, contact your doctor. Follow these instructions at home: Care for cuts from surgery (incisions)   Follow instructions from your doctor about how to take care of your cuts from surgery. Make sure you: ? Wash your hands with soap and water before you change your bandage (dressing). If you cannot use soap and water, use hand sanitizer. ? Change your bandage as told by your doctor. ? Leave stitches (sutures), skin glue, or skin tape (adhesive) strips in place. They may need to stay in place for 2 weeks or longer. If tape strips get loose and curl up, you may trim the loose edges. Do not remove tape strips completely unless your doctor says it is okay.  Do not take baths, swim, or use a hot tub until your doctor says it is okay. OK TO SHOWER 24HRS AFTER YOUR SURGERY.   Check your surgical cut area every day for signs of infection. Check for: ? More redness, swelling, or pain. ? More fluid or blood. ? Warmth. ? Pus or a bad smell. Activity  Do not drive or use heavy machinery while taking prescription pain medicine.  Do not play contact sports until your doctor says it is okay.  Do not drive for 24 hours if you were given a medicine to help you relax (sedative).  Rest as needed. Do not return to work or school until your doctor says it is okay. General instructions .  tylenol and advil as needed for discomfort.  Please alternate between the two every four hours as needed for pain.   .  Use narcotics, if prescribed, only when tylenol and motrin is not enough to control pain. .  325-650mg every 8hrs to max of 3000mg/24hrs (including the 325mg in every norco dose) for the tylenol.   .  Advil up to 800mg per dose every 8hrs as needed for pain.    To prevent or treat constipation  while you are taking prescription pain medicine, your doctor may recommend that you: ? Drink enough fluid to keep your pee (urine) clear or pale yellow. ? Take over-the-counter or prescription medicines. ? Eat foods that are high in fiber, such as fresh fruits and vegetables, whole grains, and beans. ? Limit foods that are high in fat and processed sugars, such as fried and sweet foods. Contact a doctor if:  You develop a rash.  You have more redness, swelling, or pain around your surgical cuts.  You have more fluid or blood coming from your surgical cuts.  Your surgical cuts feel warm to the touch.  You have pus or a bad smell coming from your surgical cuts.  You have a fever.  One or more of your surgical cuts breaks open. Get help right away if:  You have trouble breathing.  You have chest pain.  You have pain that is getting worse in your shoulders.  You faint or feel dizzy when you stand.  You have very bad pain in your belly (abdomen).  You are sick to your stomach (nauseous) for more than one day.  You have throwing up (vomiting) that lasts for more than one day.  You have leg pain. This information is not intended to replace advice given to you by your health care provider. Make sure you discuss any questions you have with your   health care provider. Document Released: 04/01/2008 Document Revised: 01/12/2016 Document Reviewed: 12/10/2015 Elsevier Interactive Patient Education  2019 [employer]   

## 2019-05-19 NOTE — Progress Notes (Signed)
Notified Dr. Lysle Pearl of episode of vomiting this a.m., also patient very dizzy/lightheaded.  Zofran given.  VSS.

## 2019-05-19 NOTE — Progress Notes (Addendum)
Patient complained of nausea after eating supper.  Passing flatus, no BM yet since surgery.  MD notified and will cancel DC.  Continue to monitor.  Physician off site; RN unable to cancel discharge order per verbal as it is "ADT".  MD notified.

## 2019-05-20 LAB — CBC
HCT: 40 % (ref 36.0–46.0)
Hemoglobin: 12.9 g/dL (ref 12.0–15.0)
MCH: 28.2 pg (ref 26.0–34.0)
MCHC: 32.3 g/dL (ref 30.0–36.0)
MCV: 87.3 fL (ref 80.0–100.0)
Platelets: 275 10*3/uL (ref 150–400)
RBC: 4.58 MIL/uL (ref 3.87–5.11)
RDW: 13.6 % (ref 11.5–15.5)
WBC: 11.7 10*3/uL — ABNORMAL HIGH (ref 4.0–10.5)
nRBC: 0 % (ref 0.0–0.2)

## 2019-05-20 LAB — HEPATIC FUNCTION PANEL
ALT: 189 U/L — ABNORMAL HIGH (ref 0–44)
AST: 41 U/L (ref 15–41)
Albumin: 3.5 g/dL (ref 3.5–5.0)
Alkaline Phosphatase: 131 U/L — ABNORMAL HIGH (ref 38–126)
Bilirubin, Direct: <0.1 mg/dL (ref 0.0–0.2)
Total Bilirubin: 0.5 mg/dL (ref 0.3–1.2)
Total Protein: 7 g/dL (ref 6.5–8.1)

## 2019-05-20 LAB — GLUCOSE, CAPILLARY
Glucose-Capillary: 134 mg/dL — ABNORMAL HIGH (ref 70–99)
Glucose-Capillary: 171 mg/dL — ABNORMAL HIGH (ref 70–99)

## 2019-05-20 LAB — BASIC METABOLIC PANEL
Anion gap: 9 (ref 5–15)
BUN: 8 mg/dL (ref 6–20)
CO2: 26 mmol/L (ref 22–32)
Calcium: 8.2 mg/dL — ABNORMAL LOW (ref 8.9–10.3)
Chloride: 105 mmol/L (ref 98–111)
Creatinine, Ser: 0.67 mg/dL (ref 0.44–1.00)
GFR calc Af Amer: >60 mL/min (ref >60)
GFR calc non Af Amer: >60 mL/min (ref >60)
Glucose, Bld: 117 mg/dL — ABNORMAL HIGH (ref 70–99)
Potassium: 3 mmol/L — ABNORMAL LOW (ref 3.5–5.1)
Sodium: 140 mmol/L (ref 135–145)

## 2019-05-20 LAB — PHOSPHORUS: Phosphorus: 3.2 mg/dL (ref 2.5–4.6)

## 2019-05-20 LAB — MAGNESIUM: Magnesium: 2.7 mg/dL — ABNORMAL HIGH (ref 1.7–2.4)

## 2019-05-20 LAB — SURGICAL PATHOLOGY

## 2019-05-20 NOTE — Progress Notes (Signed)
Tahlor L Lafoy to be D/C'd Home per MD order.  Discussed prescriptions and follow up appointments with the patient. Prescriptions given to patient, medication list explained in detail. Pt verbalized understanding.  Allergies as of 05/20/2019      Reactions   Cashew Nut Oil Shortness Of Breath   Shellfish Allergy Shortness Of Breath   Asa [aspirin]    Intestinal bleeding      Medication List    STOP taking these medications   albuterol 108 (90 Base) MCG/ACT inhaler Commonly known as: VENTOLIN HFA   atorvastatin 40 MG tablet Commonly known as: LIPITOR   BIOTIN PO   buPROPion 150 MG 24 hr tablet Commonly known as: WELLBUTRIN XL   busPIRone 7.5 MG tablet Commonly known as: BUSPAR   Fish Oil 1000 MG Caps   fluticasone 50 MCG/ACT nasal spray Commonly known as: FLONASE   GINKGO BILOBA PO   hydrochlorothiazide 25 MG tablet Commonly known as: HYDRODIURIL   Klor-Con M20 20 MEQ tablet Generic drug: potassium chloride SA   metFORMIN 500 MG 24 hr tablet Commonly known as: GLUCOPHAGE-XR   omeprazole 20 MG capsule Commonly known as: PRILOSEC   sertraline 100 MG tablet Commonly known as: ZOLOFT   sitaGLIPtin-metformin 50-500 MG tablet Commonly known as: JANUMET   topiramate 100 MG tablet Commonly known as: TOPAMAX     TAKE these medications   acetaminophen 325 MG tablet Commonly known as: Tylenol Take 2 tablets (650 mg total) by mouth every 8 (eight) hours as needed for mild pain.   docusate sodium 100 MG capsule Commonly known as: Colace Take 1 capsule (100 mg total) by mouth 2 (two) times daily as needed for up to 10 days for mild constipation.   HYDROcodone-acetaminophen 5-325 MG tablet Commonly known as: Norco Take 1 tablet by mouth every 6 (six) hours as needed for up to 6 doses for moderate pain.   ibuprofen 800 MG tablet Commonly known as: ADVIL Take 1 tablet (800 mg total) by mouth every 8 (eight) hours as needed for mild pain or moderate pain.        Vitals:   05/20/19 0906 05/20/19 1128  BP: 131/81 (!) 149/96  Pulse: 72 84  Resp:  20  Temp: 98.9 F (37.2 C) 98.5 F (36.9 C)  SpO2: 98% 100%    Skin clean, dry and intact without evidence of skin break down, no evidence of skin tears noted. IV catheter discontinued intact. Site without signs and symptoms of complications. Dressing and pressure applied. Pt denies pain at this time. No complaints noted.  An After Visit Summary was printed and given to the patient. Patient escorted via [place], and D/C home via private auto.  Fuller Mandril, RN

## 2019-11-11 ENCOUNTER — Emergency Department
Admission: EM | Admit: 2019-11-11 | Discharge: 2019-11-11 | Disposition: A | Discharge status: Home / Self Care | Payer: BC Managed Care – PPO | Attending: Emergency Medicine | Admitting: Emergency Medicine

## 2019-11-11 ENCOUNTER — Other Ambulatory Visit: Payer: Self-pay

## 2019-11-11 ENCOUNTER — Emergency Department: Payer: BC Managed Care – PPO

## 2019-11-11 DIAGNOSIS — E119 Type 2 diabetes mellitus without complications: Secondary | ICD-10-CM | POA: Insufficient documentation

## 2019-11-11 DIAGNOSIS — Z79899 Other long term (current) drug therapy: Secondary | ICD-10-CM | POA: Diagnosis not present

## 2019-11-11 DIAGNOSIS — I10 Essential (primary) hypertension: Secondary | ICD-10-CM | POA: Diagnosis not present

## 2019-11-11 DIAGNOSIS — R202 Paresthesia of skin: Secondary | ICD-10-CM | POA: Insufficient documentation

## 2019-11-11 DIAGNOSIS — R52 Pain, unspecified: Secondary | ICD-10-CM | POA: Insufficient documentation

## 2019-11-11 DIAGNOSIS — M79601 Pain in right arm: Secondary | ICD-10-CM | POA: Diagnosis present

## 2019-11-11 LAB — TROPONIN I (HIGH SENSITIVITY): Troponin I (High Sensitivity): 6 ng/L (ref <18)

## 2019-11-11 LAB — BASIC METABOLIC PANEL
Anion gap: 11 (ref 5–15)
BUN: 12 mg/dL (ref 6–20)
CO2: 24 mmol/L (ref 22–32)
Calcium: 9 mg/dL (ref 8.9–10.3)
Chloride: 104 mmol/L (ref 98–111)
Creatinine, Ser: 0.67 mg/dL (ref 0.44–1.00)
GFR calc Af Amer: >60 mL/min (ref >60)
GFR calc non Af Amer: >60 mL/min (ref >60)
Glucose, Bld: 237 mg/dL — ABNORMAL HIGH (ref 70–99)
Potassium: 3.2 mmol/L — ABNORMAL LOW (ref 3.5–5.1)
Sodium: 139 mmol/L (ref 135–145)

## 2019-11-11 LAB — CBC WITH DIFFERENTIAL/PLATELET
Abs Immature Granulocytes: 0.01 10*3/uL (ref 0.00–0.07)
Basophils Absolute: 0 10*3/uL (ref 0.0–0.1)
Basophils Relative: 0 %
Eosinophils Absolute: 0.3 10*3/uL (ref 0.0–0.5)
Eosinophils Relative: 3 %
HCT: 41.9 % (ref 36.0–46.0)
Hemoglobin: 13.7 g/dL (ref 12.0–15.0)
Immature Granulocytes: 0 %
Lymphocytes Relative: 62 %
Lymphs Abs: 6.4 10*3/uL — ABNORMAL HIGH (ref 0.7–4.0)
MCH: 29 pg (ref 26.0–34.0)
MCHC: 32.7 g/dL (ref 30.0–36.0)
MCV: 88.8 fL (ref 80.0–100.0)
Monocytes Absolute: 0.5 10*3/uL (ref 0.1–1.0)
Monocytes Relative: 5 %
Neutro Abs: 3.1 10*3/uL (ref 1.7–7.7)
Neutrophils Relative %: 30 %
Platelets: 242 10*3/uL (ref 150–400)
RBC: 4.72 MIL/uL (ref 3.87–5.11)
RDW: 13.3 % (ref 11.5–15.5)
WBC: 10.3 10*3/uL (ref 4.0–10.5)
nRBC: 0 % (ref 0.0–0.2)

## 2019-11-11 MED ORDER — PREDNISONE 20 MG PO TABS: 20.0000 mg | ORAL_TABLET | Freq: Two times a day (BID) | ORAL | 0 refills | Status: AC

## 2019-11-11 NOTE — ED Notes (Addendum)
Pt states BP has been elevated since she gained approximately 20 pounds during covid outbreak/lockdown and hypertension is not acute today.

## 2019-11-11 NOTE — ED Notes (Signed)
Provider Bacon-Menshew made aware of patient's BP prior to discharge; pt has been encouraged to follow up with primary care physician to manage hypertension.

## 2019-11-11 NOTE — Discharge Instructions (Signed)
Your exam, labs, EKG, and [place] studies are all normal and reassuring. Follow-up with Dr. Lovie Macadamia as needed. Return to the ED for worsening symptoms.

## 2019-11-11 NOTE — ED Triage Notes (Signed)
Patient reports right arm and right leg pain all day.  Denies any type of injury.

## 2019-11-11 NOTE — ED Provider Notes (Signed)
Procedures     ----------------------------------------- 10:51 PM on 11/11/2019 ----------------------------------------- EKG interpreted by me  Date: 11/11/2019  Rate: 74  Rhythm: normal sinus rhythm  QRS Axis: normal  Intervals: normal  ST/T Wave abnormalities: normal  Conduction Disutrbances: none  Narrative Interpretation: unremarkable         Carrie Mew, MD 11/11/19 2251

## 2019-11-11 NOTE — ED Provider Notes (Signed)
[facility] Emergency Department Provider Note ____________________________________________  Time seen: 2117  I have reviewed the triage vital signs and the nursing notes.  HISTORY  Chief Complaint  Leg Pain and Arm Pain  HPI Kerry Webb is a 54 y.o. female presents her self to the ED for evaluation of intermittent fleeting pains to the right upper extremity and right lower extremity.  The patient describes fleeting pain that feels like someone is "pinching a nerve in her armpit, and pinching a nerve to the posterior knee.  Patient describes onset over the last few days, with increasing frequency since she received her single [employer] vaccine dose on Monday.  She received the vaccine to the right deltoid, but denies any local reaction or adverse reaction in the interim.  She denies any chest pain, shortness of breath, strength changes, or paresthesias.  She does report some tingling in the hands as well as some hand swelling.  She denies fevers, sweats, nausea, vomiting, dizziness, or syncope.  She denies any history of radiculopathy, and denies any previous episodes of blood clots.   Past Medical History:  Diagnosis Date  . Diabetes mellitus without complication ([place])   . GERD (gastroesophageal reflux disease)   . Heart murmur 1967  . Hypertension 1993  . Lump or mass in breast    Left  . Seizures ([place]) 1983  . Ulcer 1985    Patient Active Problem List   Diagnosis Date Noted  . Acute cholecystitis 05/17/2019  . Family history of breast cancer 01/06/2013  . History of nipple discharge 01/06/2013    Past Surgical History:  Procedure Laterality Date  . ABDOMINAL HYSTERECTOMY  2004  . APPENDECTOMY  2014  . BREAST BIOPSY Right 01-23-14   benign  . BREAST SURGERY Left 01/14/12  . BREAST SURGERY Right 05-16-14   benign  . CHOLECYSTECTOMY N/A 05/18/2019   Procedure: LAPAROSCOPIC CHOLECYSTECTOMY;  Surgeon: Benjamine Sprague, DO;  Location: [facility] ORS;   Service: General;  Laterality: N/A;  . COLONOSCOPY  2013   Dr. Candace Cruise    Prior to Admission medications   Medication Sig Start Date End Date Taking? Authorizing Provider  amLODipine (NORVASC) 10 MG tablet Take 10 mg by mouth daily.   Yes [provider]  hydrochlorothiazide (HYDRODIURIL) 12.5 MG tablet Take 12.5 mg by mouth daily.   Yes [provider]  lisinopril (ZESTRIL) 20 MG tablet Take 20 mg by mouth daily.   Yes [provider]  predniSONE (DELTASONE) 20 MG tablet Take 1 tablet (20 mg total) by mouth 2 (two) times daily with a meal for 5 days. 11/11/19 11/16/19  Ranessa Kosta, Dannielle Karvonen, PA-C    Allergies Cashew nut oil, Shellfish allergy, and Asa [aspirin]  Family History  Problem Relation Age of Onset  . Cancer Maternal Grandmother        breast  . Liver cancer Mother   . Cancer Mother        liver  . Diabetes Maternal Aunt     Social History Social History   Tobacco Use  . Smoking status: Never Smoker  . Smokeless tobacco: Never Used  Substance Use Topics  . Alcohol use: Yes    Alcohol/week: 1.0 standard drinks    Types: 1 Glasses of wine per week  . Drug use: No    Review of Systems  Constitutional: Negative for fever. Eyes: Negative for visual changes. ENT: Negative for sore throat. Cardiovascular: Negative for chest pain. Respiratory: Negative for shortness of  breath. Gastrointestinal: Negative for abdominal pain, vomiting and diarrhea. Genitourinary: Negative for dysuria. Musculoskeletal: Negative for back pain. Skin: Negative for rash. Neurological: Negative for headaches, focal weakness or numbness.  Right upper/lower extremity paresthesias. ____________________________________________  PHYSICAL EXAM:  VITAL SIGNS: ED Triage Vitals [11/11/19 2030]  Enc Vitals Group     BP (!) 183/106     Pulse Rate 87     Resp 17     Temp 98.2 F (36.8 C)     Temp Source Oral     SpO2 96 %     Weight 200 lb (90.7 kg)     Height 5'  4" (1.626 m)     Head Circumference      Peak Flow      Pain Score 5     Pain Loc      Pain Edu?      Excl. in [place]?     Constitutional: Alert and oriented. Well appearing and in no distress. Head: Normocephalic and atraumatic. Eyes: Conjunctivae are normal. PERRL. Normal extraocular movements Neck: Supple. No thyromegaly. Cardiovascular: Normal rate, regular rhythm.  No murmurs, rubs, or gallops.  Normal distal pulses.  Normal capillary refill. Respiratory: Normal respiratory effort. No wheezes/rales/rhonchi. Musculoskeletal: Normal composite fist bilaterally.  Full active range of motion resistive testing to the upper extremities.  Nontender with normal range of motion in all extremities.  Neurologic: Cranial nerves II through XII grossly intact.  Normal gait without ataxia. Normal speech and language. No gross focal neurologic deficits are appreciated. Skin:  Skin is warm, dry and intact. No rash noted.  No CCE distally Psychiatric: Mood and affect are normal. Patient exhibits appropriate insight and judgment. ____________________________________________   LABS (pertinent positives/negatives) Labs Reviewed  CBC WITH DIFFERENTIAL/PLATELET - Abnormal; Notable for the following components:      Result Value   Lymphs Abs 6.4 (*)    All other components within normal limits  BASIC METABOLIC PANEL - Abnormal; Notable for the following components:   Potassium 3.2 (*)    Glucose, Bld 237 (*)    All other components within normal limits  TROPONIN I (HIGH SENSITIVITY)  ___________________________________________  EKG ____________________________________________   RADIOLOGY  US Venous Doppler RUE  IMPRESSION: No evidence of deep venous thrombosis.  US Venous Doppler RLE  IMPRESSION: No evidence of deep venous thrombosis. ____________________________________________  PROCEDURES  Procedures ____________________________________________  INITIAL IMPRESSION / ASSESSMENT AND  PLAN / ED COURSE  Patient with ED evaluation of Right UE/LE intermittent paresthesias. She was vaccinated with her J&J vaccine on Monday, prior to onset. Her exam, labs, and [place] studies are normal at this time. She is reassured by her normal work-up. There is no evidence of DVT, infection, ACS, or trauma. She will be referred to her PCP for further symptom management.   MAESA PAINE was evaluated in Emergency Department on 11/13/2019 for the symptoms described in the history of present illness. She was evaluated in the context of the global COVID-19 pandemic, which necessitated consideration that the patient might be at risk for infection with the SARS-CoV-2 virus that causes COVID-19. Institutional protocols and algorithms that pertain to the evaluation of patients at risk for COVID-19 are in a state of rapid change based on information released by regulatory bodies including the CDC and federal and state organizations. These policies and algorithms were followed during the patient's care in the ED. ____________________________________________  FINAL CLINICAL IMPRESSION(S) / ED DIAGNOSES  Final diagnoses:  Paresthesia  Pain  Melvenia Needles, PA-C 11/13/19 1713    Earleen Newport, MD 11/14/19 [phone]

## 2019-11-11 NOTE — ED Notes (Signed)
US at bedside

## 2020-02-07 ENCOUNTER — Other Ambulatory Visit: Payer: Self-pay

## 2020-02-07 ENCOUNTER — Encounter: Payer: Self-pay | Admitting: Emergency Medicine

## 2020-02-07 DIAGNOSIS — R55 Syncope and collapse: Secondary | ICD-10-CM | POA: Diagnosis not present

## 2020-02-07 DIAGNOSIS — R519 Headache, unspecified: Secondary | ICD-10-CM | POA: Insufficient documentation

## 2020-02-07 DIAGNOSIS — Z5321 Procedure and treatment not carried out due to patient leaving prior to being seen by health care provider: Secondary | ICD-10-CM | POA: Insufficient documentation

## 2020-02-07 DIAGNOSIS — I10 Essential (primary) hypertension: Secondary | ICD-10-CM | POA: Insufficient documentation

## 2020-02-07 LAB — BASIC METABOLIC PANEL
Anion gap: 10 (ref 5–15)
BUN: 22 mg/dL — ABNORMAL HIGH (ref 6–20)
CO2: 27 mmol/L (ref 22–32)
Calcium: 9.1 mg/dL (ref 8.9–10.3)
Chloride: 101 mmol/L (ref 98–111)
Creatinine, Ser: 0.87 mg/dL (ref 0.44–1.00)
GFR calc Af Amer: >60 mL/min (ref >60)
GFR calc non Af Amer: >60 mL/min (ref >60)
Glucose, Bld: 273 mg/dL — ABNORMAL HIGH (ref 70–99)
Potassium: 3 mmol/L — ABNORMAL LOW (ref 3.5–5.1)
Sodium: 138 mmol/L (ref 135–145)

## 2020-02-07 LAB — CBC
HCT: 41.2 % (ref 36.0–46.0)
Hemoglobin: 14 g/dL (ref 12.0–15.0)
MCH: 29.9 pg (ref 26.0–34.0)
MCHC: 34 g/dL (ref 30.0–36.0)
MCV: 87.8 fL (ref 80.0–100.0)
Platelets: 315 10*3/uL (ref 150–400)
RBC: 4.69 MIL/uL (ref 3.87–5.11)
RDW: 13.2 % (ref 11.5–15.5)
WBC: 12.7 10*3/uL — ABNORMAL HIGH (ref 4.0–10.5)
nRBC: 0 % (ref 0.0–0.2)

## 2020-02-07 LAB — TROPONIN I (HIGH SENSITIVITY): Troponin I (High Sensitivity): 5 ng/L (ref <18)

## 2020-02-07 NOTE — ED Triage Notes (Signed)
Pt presents to ED via EMS from home with c/o high blood pressure and a headache for the past week. Pt states she has a hx of HTN and take her medications as prescribed. BP with EMS 189/89 and 189/120 with a FSBS of 187.  While walking to truck with medic pt had a syncopal episode falling backwards onto the grass. Pt states all she remembers is it felt like she was falling in a dream. Denies hitting her head or any pain from fall.

## 2020-02-08 ENCOUNTER — Encounter: Payer: Self-pay | Admitting: Emergency Medicine

## 2020-02-08 ENCOUNTER — Emergency Department
Admission: EM | Admit: 2020-02-08 | Discharge: 2020-02-08 | Disposition: A | Discharge status: Home / Self Care | Payer: BC Managed Care – PPO | Attending: Emergency Medicine | Admitting: Emergency Medicine

## 2020-02-08 DIAGNOSIS — R030 Elevated blood-pressure reading, without diagnosis of hypertension: Secondary | ICD-10-CM | POA: Insufficient documentation

## 2020-02-08 DIAGNOSIS — Z5321 Procedure and treatment not carried out due to patient leaving prior to being seen by health care provider: Secondary | ICD-10-CM | POA: Diagnosis not present

## 2020-02-08 NOTE — ED Notes (Signed)
No answer when called several times from lobby 

## 2020-02-08 NOTE — ED Notes (Signed)
N/A in lobby when called to repeat vital signs

## 2020-02-08 NOTE — ED Triage Notes (Signed)
Pt to ED on 8/3 for elevated BP, left due to wait times, seen at PCP today, prescribed metoprolol, took 4 and still at 194/124. Pt experiencing "black cloud" that she says happens and she feels she is going to pass out. Blood work obtained here on 8/3 and at pcp today.

## 2020-02-09 ENCOUNTER — Emergency Department
Admission: EM | Admit: 2020-02-09 | Discharge: 2020-02-09 | Disposition: A | Discharge status: Home / Self Care | Payer: BC Managed Care – PPO | Attending: Emergency Medicine | Admitting: Emergency Medicine

## 2020-02-09 NOTE — ED Notes (Signed)
No answer when called several times from lobby 

## 2020-02-13 ENCOUNTER — Telehealth: Payer: Self-pay | Admitting: Emergency Medicine

## 2020-02-13 NOTE — Telephone Encounter (Signed)
Called patient due to lwot to inquire about condition and follow up plans. Says she is checking bp every day and it is better than it was. And she is working with her pcp on this.

## 2021-01-29 ENCOUNTER — Emergency Department: Payer: Managed Care, Other (non HMO)

## 2021-01-29 ENCOUNTER — Other Ambulatory Visit: Payer: Self-pay

## 2021-01-29 ENCOUNTER — Encounter: Payer: Self-pay | Admitting: Intensive Care

## 2021-01-29 ENCOUNTER — Emergency Department
Admission: EM | Admit: 2021-01-29 | Discharge: 2021-01-29 | Disposition: A | Discharge status: Home / Self Care | Payer: Managed Care, Other (non HMO) | Attending: Student in an Organized Health Care Education/Training Program | Admitting: Student in an Organized Health Care Education/Training Program

## 2021-01-29 DIAGNOSIS — E119 Type 2 diabetes mellitus without complications: Secondary | ICD-10-CM | POA: Insufficient documentation

## 2021-01-29 DIAGNOSIS — I10 Essential (primary) hypertension: Secondary | ICD-10-CM

## 2021-01-29 DIAGNOSIS — R42 Dizziness and giddiness: Secondary | ICD-10-CM | POA: Diagnosis not present

## 2021-01-29 DIAGNOSIS — Z79899 Other long term (current) drug therapy: Secondary | ICD-10-CM | POA: Insufficient documentation

## 2021-01-29 DIAGNOSIS — M79602 Pain in left arm: Secondary | ICD-10-CM | POA: Diagnosis not present

## 2021-01-29 DIAGNOSIS — Z20822 Contact with and (suspected) exposure to covid-19: Secondary | ICD-10-CM | POA: Diagnosis not present

## 2021-01-29 DIAGNOSIS — R410 Disorientation, unspecified: Secondary | ICD-10-CM | POA: Insufficient documentation

## 2021-01-29 DIAGNOSIS — R030 Elevated blood-pressure reading, without diagnosis of hypertension: Secondary | ICD-10-CM | POA: Diagnosis present

## 2021-01-29 LAB — BASIC METABOLIC PANEL
Anion gap: 9 (ref 5–15)
BUN: 13 mg/dL (ref 6–20)
CO2: 28 mmol/L (ref 22–32)
Calcium: 9.6 mg/dL (ref 8.9–10.3)
Chloride: 102 mmol/L (ref 98–111)
Creatinine, Ser: 0.68 mg/dL (ref 0.44–1.00)
GFR, Estimated: >60 mL/min (ref >60)
Glucose, Bld: 211 mg/dL — ABNORMAL HIGH (ref 70–99)
Potassium: 3.3 mmol/L — ABNORMAL LOW (ref 3.5–5.1)
Sodium: 139 mmol/L (ref 135–145)

## 2021-01-29 LAB — CBC
HCT: 44.3 % (ref 36.0–46.0)
Hemoglobin: 15.1 g/dL — ABNORMAL HIGH (ref 12.0–15.0)
MCH: 30.3 pg (ref 26.0–34.0)
MCHC: 34.1 g/dL (ref 30.0–36.0)
MCV: 88.8 fL (ref 80.0–100.0)
Platelets: 278 10*3/uL (ref 150–400)
RBC: 4.99 MIL/uL (ref 3.87–5.11)
RDW: 13 % (ref 11.5–15.5)
WBC: 10.1 10*3/uL (ref 4.0–10.5)
nRBC: 0 % (ref 0.0–0.2)

## 2021-01-29 LAB — TROPONIN I (HIGH SENSITIVITY)
Troponin I (High Sensitivity): 7 ng/L (ref <18)
Troponin I (High Sensitivity): 6 ng/L (ref <18)

## 2021-01-29 LAB — RESP PANEL BY RT-PCR (FLU A&B, COVID) ARPGX2
Influenza A by PCR: NEGATIVE
Influenza B by PCR: NEGATIVE
SARS Coronavirus 2 by RT PCR: NEGATIVE

## 2021-01-29 NOTE — ED Triage Notes (Signed)
Patient reports left sided chest pain with radiation to left arm X2 weeks.

## 2021-01-29 NOTE — ED Provider Notes (Signed)
[facility] Emergency Department Provider Note    Event Date/Time   First MD Initiated Contact with Patient 01/29/21 1254     (approximate)  I have reviewed the triage vital signs and the nursing notes.   HISTORY  Chief Complaint Chest Pain    HPI Kerry Webb is a 55 y.o. female the below listed past medical history presents to the ER for evaluation of lightheadedness episode of confusion that occurred while she was standing at work during a meeting.  Did not fall or fully lose consciousness.  States that she has been having intermittent stabbing-like pains in her left arm radiating to her neck and has been ongoing for several weeks.  Not have any measured fevers or temperature.  No nausea or vomiting.  No flashing lights.  States that she has had a headache for about the past week.  Was not sudden onset no thunderclap headache.  Past Medical History:  Diagnosis Date   Diabetes mellitus without complication ([place])    GERD (gastroesophageal reflux disease)    Heart murmur 1967   Hypertension 1993   Lump or mass in breast    Left   Seizures ([place]) 1983   Ulcer 1985   Family History  Problem Relation Age of Onset   Cancer Maternal Grandmother        breast   Liver cancer Mother    Cancer Mother        liver   Diabetes Maternal Aunt    Past Surgical History:  Procedure Laterality Date   ABDOMINAL HYSTERECTOMY  2004   APPENDECTOMY  2014   BREAST BIOPSY Right 01-23-14   benign   BREAST SURGERY Left 01/14/12   BREAST SURGERY Right 05-16-14   benign   CHOLECYSTECTOMY N/A 05/18/2019   Procedure: LAPAROSCOPIC CHOLECYSTECTOMY;  Surgeon: Benjamine Sprague, DO;  Location: [facility] ORS;  Service: General;  Laterality: N/A;   COLONOSCOPY  2013   Dr. Candace Cruise   Patient Active Problem List   Diagnosis Date Noted   Acute cholecystitis 05/17/2019   Family history of breast cancer 01/06/2013   History of nipple discharge 01/06/2013      Prior to Admission  medications   Medication Sig Start Date End Date Taking? Authorizing Provider  amLODipine (NORVASC) 10 MG tablet Take 10 mg by mouth daily.    [provider]  hydrochlorothiazide (HYDRODIURIL) 12.5 MG tablet Take 12.5 mg by mouth daily.    [provider]  lisinopril (ZESTRIL) 20 MG tablet Take 20 mg by mouth daily.    [provider]    Allergies Cashew nut oil, Shellfish allergy, and Asa [aspirin]    Social History Social History   Tobacco Use   Smoking status: Never   Smokeless tobacco: Never  Substance Use Topics   Alcohol use: Yes    Alcohol/week: 1.0 standard drink    Types: 1 Glasses of wine per week   Drug use: No    Review of Systems Patient denies headaches, rhinorrhea, blurry vision, numbness, shortness of breath, chest pain, edema, cough, abdominal pain, nausea, vomiting, diarrhea, dysuria, fevers, rashes or hallucinations unless otherwise stated above in HPI. ____________________________________________   PHYSICAL EXAM:  VITAL SIGNS: Vitals:   01/29/21 1320 01/29/21 1407  BP: (!) 190/115 (!) 161/106  Pulse: 66 68  Resp:  16  Temp:    SpO2: 97% 99%    Constitutional: Alert and oriented.  Eyes: Conjunctivae are normal.  Head: Atraumatic. Nose: No congestion/rhinnorhea. Mouth/Throat: Mucous membranes  are moist.   Neck: No stridor. Painless ROM.  Cardiovascular: Normal rate, regular rhythm. Grossly normal heart sounds.  Good peripheral circulation. Respiratory: Normal respiratory effort.  No retractions. Lungs CTAB. Gastrointestinal: Soft and nontender. No distention. No abdominal bruits. No CVA tenderness. Genitourinary:  Musculoskeletal: No lower extremity tenderness nor edema.  No joint effusions. Neurologic:  CN- intact.  No facial droop, Normal FNF.  Normal heel to shin.  Sensation intact bilaterally. Normal speech and language. No gross focal neurologic deficits are appreciated. No gait instability. Skin:  Skin is warm,  dry and intact. No rash noted. Psychiatric: Mood and affect are normal. Speech and behavior are normal.  ____________________________________________   LABS (all labs ordered are listed, but only abnormal results are displayed)  Results for orders placed or performed during the hospital encounter of 01/29/21 (from the past 24 hour(s))  Basic metabolic panel     Status: Abnormal   Collection Time: 01/29/21 11:13 AM  Result Value Ref Range   Sodium 139 135 - 145 mmol/L   Potassium 3.3 (L) 3.5 - 5.1 mmol/L   Chloride 102 98 - 111 mmol/L   CO2 28 22 - 32 mmol/L   Glucose, Bld 211 (H) 70 - 99 mg/dL   BUN 13 6 - 20 mg/dL   Creatinine, Ser 0.68 0.44 - 1.00 mg/dL   Calcium 9.6 8.9 - 10.3 mg/dL   GFR, Estimated >60 >60 mL/min   Anion gap 9 5 - 15  CBC     Status: Abnormal   Collection Time: 01/29/21 11:13 AM  Result Value Ref Range   WBC 10.1 4.0 - 10.5 K/uL   RBC 4.99 3.87 - 5.11 MIL/uL   Hemoglobin 15.1 (H) 12.0 - 15.0 g/dL   HCT 44.3 36.0 - 46.0 %   MCV 88.8 80.0 - 100.0 fL   MCH 30.3 26.0 - 34.0 pg   MCHC 34.1 30.0 - 36.0 g/dL   RDW 13.0 11.5 - 15.5 %   Platelets 278 150 - 400 K/uL   nRBC 0.0 0.0 - 0.2 %  Troponin I (High Sensitivity)     Status: None   Collection Time: 01/29/21 11:13 AM  Result Value Ref Range   Troponin I (High Sensitivity) 7 <18 ng/L  Resp Panel by RT-PCR (Flu A&B, Covid) Nasopharyngeal Swab     Status: None   Collection Time: 01/29/21  1:19 PM   Specimen: Nasopharyngeal Swab; Nasopharyngeal(NP) swabs in vial transport medium  Result Value Ref Range   SARS Coronavirus 2 by RT PCR NEGATIVE NEGATIVE   Influenza A by PCR NEGATIVE NEGATIVE   Influenza B by PCR NEGATIVE NEGATIVE  Troponin I (High Sensitivity)     Status: None   Collection Time: 01/29/21  1:40 PM  Result Value Ref Range   Troponin I (High Sensitivity) 6 <18 ng/L   ____________________________________________  EKG My review and personal interpretation at Time: 10:57   Indication: chest  pain  Rate: 60  Rhythm: sinus Axis: normal Other: normal intervals, no stemi ____________________________________________  RADIOLOGY  I personally reviewed all radiographic images ordered to evaluate for the above acute complaints and reviewed radiology reports and findings.  These findings were personally discussed with the patient.  Please see medical record for radiology report.  ____________________________________________   PROCEDURES  Procedure(s) performed:  Procedures    Critical Care performed: no ____________________________________________   INITIAL IMPRESSION / ASSESSMENT AND PLAN / ED COURSE  Pertinent labs & imaging results that were available during my care of the  patient were reviewed by me and considered in my medical decision making (see chart for details).   DDX: ACS, pericarditis, esophagitis, boerhaaves, pe, dissection, pna, bronchitis, costochondritis   Kerry Webb is a 55 y.o. who presents to the ED with presentation as described above.  Patient nontoxic-appearing with reassuring neuro exam.  Given age and risk factors with hypertension CT imaging ordered for the blood differential.  Have lower suspicion for ACS but will receive serial enzymes.  Does not seem consistent with dissection.  She is neurovascular intact throughout.  Clinical Course as of 01/29/21 1506  Tue Jan 29, 2021  1505 Patient reassessed.  Well-appearing and in no acute distress.  Work-up is reassuring.  No sign of ACS.  Not consistent with TIA or or CVA.  Not consistent with seizure.  No sign of infectious process.  Does appear stable and appropriate for outpatient follow-up. [PR]    Clinical Course User Index [PR] Merlyn Lot, MD    The patient was evaluated in Emergency Department today for the symptoms described in the history of present illness. He/she was evaluated in the context of the global COVID-19 pandemic, which necessitated consideration that the patient might  be at risk for infection with the SARS-CoV-2 virus that causes COVID-19. Institutional protocols and algorithms that pertain to the evaluation of patients at risk for COVID-19 are in a state of rapid change based on information released by regulatory bodies including the CDC and federal and state organizations. These policies and algorithms were followed during the patient's care in the ED.  As part of my medical decision making, I reviewed the following data within the [place] notes reviewed and incorporated, Labs reviewed, notes from prior ED visits and [place] Controlled Substance Database   ____________________________________________   FINAL CLINICAL IMPRESSION(S) / ED DIAGNOSES  Final diagnoses:  Hypertension, unspecified type  Arm pain, central, left      NEW MEDICATIONS STARTED DURING THIS VISIT:  New Prescriptions   No medications on file     Note:  This document was prepared using Dragon voice recognition software and may include unintentional dictation errors.    Merlyn Lot, MD 01/29/21 [phone]

## 2021-02-27 ENCOUNTER — Other Ambulatory Visit (INDEPENDENT_AMBULATORY_CARE_PROVIDER_SITE_OTHER): Payer: Self-pay | Admitting: Nephrology

## 2021-02-27 DIAGNOSIS — I10 Essential (primary) hypertension: Secondary | ICD-10-CM

## 2021-03-06 ENCOUNTER — Other Ambulatory Visit: Payer: Self-pay

## 2021-03-06 ENCOUNTER — Ambulatory Visit (INDEPENDENT_AMBULATORY_CARE_PROVIDER_SITE_OTHER): Payer: Managed Care, Other (non HMO)

## 2021-03-06 DIAGNOSIS — I10 Essential (primary) hypertension: Secondary | ICD-10-CM

## 2021-04-29 ENCOUNTER — Other Ambulatory Visit: Payer: Self-pay

## 2021-04-29 DIAGNOSIS — E876 Hypokalemia: Secondary | ICD-10-CM | POA: Diagnosis present

## 2021-04-29 DIAGNOSIS — E785 Hyperlipidemia, unspecified: Secondary | ICD-10-CM | POA: Diagnosis present

## 2021-04-29 DIAGNOSIS — Z23 Encounter for immunization: Secondary | ICD-10-CM

## 2021-04-29 DIAGNOSIS — Z91013 Allergy to seafood: Secondary | ICD-10-CM

## 2021-04-29 DIAGNOSIS — F32A Depression, unspecified: Secondary | ICD-10-CM | POA: Diagnosis present

## 2021-04-29 DIAGNOSIS — Z20822 Contact with and (suspected) exposure to covid-19: Secondary | ICD-10-CM | POA: Diagnosis present

## 2021-04-29 DIAGNOSIS — E119 Type 2 diabetes mellitus without complications: Secondary | ICD-10-CM | POA: Diagnosis present

## 2021-04-29 DIAGNOSIS — E669 Obesity, unspecified: Secondary | ICD-10-CM | POA: Diagnosis present

## 2021-04-29 DIAGNOSIS — Z800 Family history of malignant neoplasm of digestive organs: Secondary | ICD-10-CM

## 2021-04-29 DIAGNOSIS — Z91018 Allergy to other foods: Secondary | ICD-10-CM

## 2021-04-29 DIAGNOSIS — I160 Hypertensive urgency: Secondary | ICD-10-CM | POA: Diagnosis present

## 2021-04-29 DIAGNOSIS — T465X6A Underdosing of other antihypertensive drugs, initial encounter: Secondary | ICD-10-CM | POA: Diagnosis present

## 2021-04-29 DIAGNOSIS — R55 Syncope and collapse: Principal | ICD-10-CM | POA: Diagnosis present

## 2021-04-29 DIAGNOSIS — Z6831 Body mass index (BMI) 31.0-31.9, adult: Secondary | ICD-10-CM

## 2021-04-29 DIAGNOSIS — Z91120 Patient's intentional underdosing of medication regimen due to financial hardship: Secondary | ICD-10-CM

## 2021-04-29 DIAGNOSIS — Z7984 Long term (current) use of oral hypoglycemic drugs: Secondary | ICD-10-CM

## 2021-04-29 DIAGNOSIS — E8881 Metabolic syndrome: Secondary | ICD-10-CM | POA: Diagnosis present

## 2021-04-29 DIAGNOSIS — Z87820 Personal history of traumatic brain injury: Secondary | ICD-10-CM

## 2021-04-29 DIAGNOSIS — Z79899 Other long term (current) drug therapy: Secondary | ICD-10-CM

## 2021-04-29 DIAGNOSIS — Z833 Family history of diabetes mellitus: Secondary | ICD-10-CM

## 2021-04-29 DIAGNOSIS — Z794 Long term (current) use of insulin: Secondary | ICD-10-CM

## 2021-04-29 DIAGNOSIS — Z886 Allergy status to analgesic agent status: Secondary | ICD-10-CM

## 2021-04-29 DIAGNOSIS — I10 Essential (primary) hypertension: Secondary | ICD-10-CM | POA: Diagnosis present

## 2021-04-29 LAB — BASIC METABOLIC PANEL
Anion gap: 9 (ref 5–15)
BUN: 10 mg/dL (ref 6–20)
CO2: 28 mmol/L (ref 22–32)
Calcium: 9.6 mg/dL (ref 8.9–10.3)
Chloride: 102 mmol/L (ref 98–111)
Creatinine, Ser: 0.71 mg/dL (ref 0.44–1.00)
GFR, Estimated: >60 mL/min (ref >60)
Glucose, Bld: 219 mg/dL — ABNORMAL HIGH (ref 70–99)
Potassium: 3.5 mmol/L (ref 3.5–5.1)
Sodium: 139 mmol/L (ref 135–145)

## 2021-04-29 LAB — CBC
HCT: 46.6 % — ABNORMAL HIGH (ref 36.0–46.0)
Hemoglobin: 15.8 g/dL — ABNORMAL HIGH (ref 12.0–15.0)
MCH: 30 pg (ref 26.0–34.0)
MCHC: 33.9 g/dL (ref 30.0–36.0)
MCV: 88.4 fL (ref 80.0–100.0)
Platelets: 193 10*3/uL (ref 150–400)
RBC: 5.27 MIL/uL — ABNORMAL HIGH (ref 3.87–5.11)
RDW: 12.9 % (ref 11.5–15.5)
WBC: 7.9 10*3/uL (ref 4.0–10.5)
nRBC: 0 % (ref 0.0–0.2)

## 2021-04-29 LAB — TROPONIN I (HIGH SENSITIVITY): Troponin I (High Sensitivity): 9 ng/L (ref <18)

## 2021-04-29 NOTE — ED Triage Notes (Signed)
Pt comes via EMs with c/o syncopal episode while at PCP. BP-181/124, HR-84, NSR, CBG-258

## 2021-04-30 ENCOUNTER — Emergency Department: Payer: Self-pay

## 2021-04-30 ENCOUNTER — Observation Stay (HOSPITAL_COMMUNITY)
Admit: 2021-04-30 | Discharge: 2021-04-30 | Disposition: A | Discharge status: Home / Self Care | Payer: Self-pay | Attending: Family Medicine | Admitting: Family Medicine

## 2021-04-30 ENCOUNTER — Inpatient Hospital Stay
Admission: EM | Admit: 2021-04-30 | Discharge: 2021-05-07 | DRG: 312 | Disposition: A | Discharge status: Other Acute Inpatient Hospital | Payer: Self-pay | Source: Ambulatory Visit | Attending: Hospitalist | Admitting: Hospitalist

## 2021-04-30 ENCOUNTER — Observation Stay: Payer: Self-pay

## 2021-04-30 DIAGNOSIS — I10 Essential (primary) hypertension: Secondary | ICD-10-CM | POA: Insufficient documentation

## 2021-04-30 DIAGNOSIS — R55 Syncope and collapse: Secondary | ICD-10-CM

## 2021-04-30 DIAGNOSIS — E119 Type 2 diabetes mellitus without complications: Secondary | ICD-10-CM

## 2021-04-30 DIAGNOSIS — I160 Hypertensive urgency: Secondary | ICD-10-CM

## 2021-04-30 DIAGNOSIS — E785 Hyperlipidemia, unspecified: Secondary | ICD-10-CM

## 2021-04-30 DIAGNOSIS — Z794 Long term (current) use of insulin: Secondary | ICD-10-CM

## 2021-04-30 LAB — CBC
HCT: 49 % — ABNORMAL HIGH (ref 36.0–46.0)
Hemoglobin: 16.6 g/dL — ABNORMAL HIGH (ref 12.0–15.0)
MCH: 30.3 pg (ref 26.0–34.0)
MCHC: 33.9 g/dL (ref 30.0–36.0)
MCV: 89.4 fL (ref 80.0–100.0)
Platelets: 149 10*3/uL — ABNORMAL LOW (ref 150–400)
RBC: 5.48 MIL/uL — ABNORMAL HIGH (ref 3.87–5.11)
RDW: 13.1 % (ref 11.5–15.5)
WBC: 9.1 10*3/uL (ref 4.0–10.5)
nRBC: 0 % (ref 0.0–0.2)

## 2021-04-30 LAB — RESP PANEL BY RT-PCR (FLU A&B, COVID) ARPGX2
Influenza A by PCR: NEGATIVE
Influenza B by PCR: NEGATIVE
SARS Coronavirus 2 by RT PCR: NEGATIVE

## 2021-04-30 LAB — HEMOGLOBIN A1C
Hgb A1c MFr Bld: 9.1 % — ABNORMAL HIGH (ref 4.8–5.6)
Mean Plasma Glucose: 214.47 mg/dL

## 2021-04-30 LAB — CREATININE, SERUM
Creatinine, Ser: 0.69 mg/dL (ref 0.44–1.00)
GFR, Estimated: >60 mL/min (ref >60)

## 2021-04-30 LAB — MAGNESIUM: Magnesium: 2 mg/dL (ref 1.7–2.4)

## 2021-04-30 LAB — TROPONIN I (HIGH SENSITIVITY): Troponin I (High Sensitivity): 10 ng/L (ref <18)

## 2021-04-30 LAB — CBG MONITORING, ED
Glucose-Capillary: 186 mg/dL — ABNORMAL HIGH (ref 70–99)
Glucose-Capillary: 154 mg/dL — ABNORMAL HIGH (ref 70–99)
Glucose-Capillary: 169 mg/dL — ABNORMAL HIGH (ref 70–99)
Glucose-Capillary: 120 mg/dL — ABNORMAL HIGH (ref 70–99)
Glucose-Capillary: 164 mg/dL — ABNORMAL HIGH (ref 70–99)

## 2021-04-30 MED ORDER — ACETAMINOPHEN 650 MG RE SUPP
650.0000 mg | Freq: Four times a day (QID) | RECTAL | Status: DC | PRN
  Filled 2021-04-30: qty 1

## 2021-04-30 MED ORDER — TRAZODONE HCL 50 MG PO TABS
25.0000 mg | ORAL_TABLET | Freq: Every evening | ORAL | Status: DC | PRN
  Administered 2021-05-02 – 2021-05-06 (×5): 25 mg via ORAL
  Filled 2021-04-30 (×5): qty 1

## 2021-04-30 MED ORDER — AMLODIPINE BESYLATE 5 MG PO TABS
10.0000 mg | ORAL_TABLET | Freq: Every day | ORAL | Status: DC
  Administered 2021-04-30: 10 mg via ORAL
  Filled 2021-04-30: qty 2

## 2021-04-30 MED ORDER — SERTRALINE HCL 50 MG PO TABS
100.0000 mg | ORAL_TABLET | Freq: Every day | ORAL | Status: DC
  Administered 2021-04-30 – 2021-05-07 (×8): 100 mg via ORAL
  Filled 2021-04-30 (×8): qty 2

## 2021-04-30 MED ORDER — BUTALBITAL-APAP-CAFFEINE 50-325-40 MG PO TABS
2.0000 | ORAL_TABLET | Freq: Once | ORAL | Status: AC
  Administered 2021-04-30: 2 via ORAL
  Filled 2021-04-30: qty 2

## 2021-04-30 MED ORDER — ONDANSETRON HCL 4 MG/2ML IJ SOLN: 4.0000 mg | Freq: Four times a day (QID) | INTRAMUSCULAR | Status: DC | PRN

## 2021-04-30 MED ORDER — ATORVASTATIN CALCIUM 20 MG PO TABS
40.0000 mg | ORAL_TABLET | Freq: Every day | ORAL | Status: DC
  Administered 2021-04-30 – 2021-05-07 (×8): 40 mg via ORAL
  Filled 2021-04-30 (×8): qty 2

## 2021-04-30 MED ORDER — CLONIDINE HCL 0.3 MG/24HR TD PTWK: 0.3000 mg | MEDICATED_PATCH | TRANSDERMAL | Status: DC

## 2021-04-30 MED ORDER — LISINOPRIL 10 MG PO TABS
20.0000 mg | ORAL_TABLET | Freq: Every day | ORAL | Status: DC
Start: 2021-04-30 — End: 2021-04-30

## 2021-04-30 MED ORDER — METOPROLOL TARTRATE 50 MG PO TABS
50.0000 mg | ORAL_TABLET | Freq: Two times a day (BID) | ORAL | Status: DC
  Administered 2021-04-30 – 2021-05-01 (×4): 50 mg via ORAL
  Filled 2021-04-30 (×4): qty 1

## 2021-04-30 MED ORDER — CLONIDINE HCL 0.1 MG PO TABS
0.2000 mg | ORAL_TABLET | Freq: Once | ORAL | Status: AC
  Administered 2021-04-30: 0.2 mg via ORAL
  Filled 2021-04-30: qty 2

## 2021-04-30 MED ORDER — LABETALOL HCL 5 MG/ML IV SOLN
20.0000 mg | INTRAVENOUS | Status: DC | PRN
  Administered 2021-04-30: 20 mg via INTRAVENOUS
  Filled 2021-04-30: qty 4

## 2021-04-30 MED ORDER — MAGNESIUM HYDROXIDE 400 MG/5ML PO SUSP: 30.0000 mL | Freq: Every day | ORAL | Status: DC | PRN

## 2021-04-30 MED ORDER — HYDRALAZINE HCL 20 MG/ML IJ SOLN: 10.0000 mg | Freq: Four times a day (QID) | INTRAMUSCULAR | Status: DC | PRN

## 2021-04-30 MED ORDER — ONDANSETRON HCL 4 MG PO TABS: 4.0000 mg | ORAL_TABLET | Freq: Four times a day (QID) | ORAL | Status: DC | PRN

## 2021-04-30 MED ORDER — POTASSIUM CHLORIDE CRYS ER 20 MEQ PO TBCR
40.0000 meq | EXTENDED_RELEASE_TABLET | Freq: Once | ORAL | Status: AC
  Administered 2021-04-30: 40 meq via ORAL
  Filled 2021-04-30: qty 2

## 2021-04-30 MED ORDER — ALUM & MAG HYDROXIDE-SIMETH 200-200-20 MG/5ML PO SUSP: 30.0000 mL | Freq: Four times a day (QID) | ORAL | Status: DC | PRN

## 2021-04-30 MED ORDER — LOSARTAN POTASSIUM 50 MG PO TABS
100.0000 mg | ORAL_TABLET | Freq: Every day | ORAL | Status: DC
  Administered 2021-04-30 – 2021-05-07 (×7): 100 mg via ORAL
  Filled 2021-04-30 (×7): qty 2

## 2021-04-30 MED ORDER — INSULIN GLARGINE-YFGN 100 UNIT/ML ~~LOC~~ SOLN
30.0000 [IU] | Freq: Every morning | SUBCUTANEOUS | Status: DC
  Administered 2021-04-30 – 2021-05-07 (×8): 30 [IU] via SUBCUTANEOUS
  Filled 2021-04-30 (×9): qty 0.3

## 2021-04-30 MED ORDER — DAPAGLIFLOZIN PROPANEDIOL 10 MG PO TABS
10.0000 mg | ORAL_TABLET | Freq: Every morning | ORAL | Status: DC
  Administered 2021-04-30 – 2021-05-07 (×8): 10 mg via ORAL
  Filled 2021-04-30: qty 1
  Filled 2021-04-30 (×2): qty 2
  Filled 2021-04-30: qty 1
  Filled 2021-04-30 (×3): qty 2
  Filled 2021-04-30: qty 1
  Filled 2021-04-30 (×2): qty 2
  Filled 2021-04-30: qty 1
  Filled 2021-04-30: qty 2

## 2021-04-30 MED ORDER — POTASSIUM CHLORIDE CRYS ER 20 MEQ PO TBCR
20.0000 meq | EXTENDED_RELEASE_TABLET | Freq: Every day | ORAL | Status: DC
  Administered 2021-04-30 – 2021-05-04 (×5): 20 meq via ORAL
  Filled 2021-04-30 (×5): qty 1

## 2021-04-30 MED ORDER — HYDROCHLOROTHIAZIDE 12.5 MG PO TABS
12.5000 mg | ORAL_TABLET | Freq: Every day | ORAL | Status: DC
Start: 2021-04-30 — End: 2021-05-01
  Administered 2021-04-30: 12.5 mg via ORAL
  Filled 2021-04-30: qty 1

## 2021-04-30 MED ORDER — POTASSIUM CHLORIDE IN NACL 20-0.9 MEQ/L-% IV SOLN
INTRAVENOUS | Status: DC
  Filled 2021-04-30: qty 1000

## 2021-04-30 MED ORDER — SODIUM CHLORIDE 0.9% FLUSH
3.0000 mL | Freq: Two times a day (BID) | INTRAVENOUS | Status: DC
  Administered 2021-04-30 – 2021-05-07 (×15): 3 mL via INTRAVENOUS

## 2021-04-30 MED ORDER — HYDRALAZINE HCL 50 MG PO TABS
25.0000 mg | ORAL_TABLET | Freq: Three times a day (TID) | ORAL | Status: DC
  Administered 2021-04-30 (×3): 25 mg via ORAL
  Filled 2021-04-30 (×3): qty 1

## 2021-04-30 MED ORDER — ACETAMINOPHEN 325 MG PO TABS
650.0000 mg | ORAL_TABLET | Freq: Four times a day (QID) | ORAL | Status: DC | PRN
  Administered 2021-05-01 – 2021-05-07 (×4): 650 mg via ORAL
  Filled 2021-04-30 (×4): qty 2

## 2021-04-30 MED ORDER — ENOXAPARIN SODIUM 60 MG/0.6ML IJ SOSY
0.5000 mg/kg | PREFILLED_SYRINGE | INTRAMUSCULAR | Status: DC
  Administered 2021-04-30: 42.5 mg via SUBCUTANEOUS
  Filled 2021-04-30: qty 0.6

## 2021-04-30 MED ORDER — CLONIDINE HCL 0.3 MG/24HR TD PTWK
0.3000 mg | MEDICATED_PATCH | TRANSDERMAL | Status: DC
  Administered 2021-04-30: 0.3 mg via TRANSDERMAL
  Filled 2021-04-30: qty 1

## 2021-04-30 MED ORDER — INSULIN ASPART 100 UNIT/ML IJ SOLN
0.0000 [IU] | Freq: Three times a day (TID) | INTRAMUSCULAR | Status: DC
  Administered 2021-04-30: 2 [IU] via SUBCUTANEOUS
  Administered 2021-04-30 (×2): 3 [IU] via SUBCUTANEOUS
  Administered 2021-05-01: 2 [IU] via SUBCUTANEOUS
  Administered 2021-05-01: 3 [IU] via SUBCUTANEOUS
  Administered 2021-05-02: 2 [IU] via SUBCUTANEOUS
  Administered 2021-05-02: 3 [IU] via SUBCUTANEOUS
  Administered 2021-05-03: 2 [IU] via SUBCUTANEOUS
  Administered 2021-05-03 (×3): 3 [IU] via SUBCUTANEOUS
  Administered 2021-05-04: 2 [IU] via SUBCUTANEOUS
  Filled 2021-04-30 (×10): qty 1

## 2021-04-30 NOTE — ED Provider Notes (Signed)
[facility] Emergency Department Provider Note ____________________________________________   Event Date/Time   First MD Initiated Contact with Patient 04/30/21 0216     (approximate)  I have reviewed the triage vital signs and the nursing notes.  HISTORY  Chief Complaint Loss of Consciousness   HPI Kerry Webb is a 55 y.o. femalewho presents to the ED for evaluation of syncopal episode.  Chart review indicates obese patient with metabolic syndrome. I review chart note from nephrology outpatient visit earlier today where patient had a witnessed syncopal episode.  Staff at the clinic witnessed her standing at a sink with a wet cloth on her neck, she began falling to the floor, but was caught and had no significant trauma.  Loss of consciousness for 30 seconds before return of consciousness without documented witnessed seizure activity.  Patient presents to the ED evaluation of the syncopal episode.  Patient reports feeling "off" in a generalized way today at the nephrologist office, but denies any focal symptoms.  Denies dizziness, headache, chest pain, back pain or palpitations.  Due to her vague symptoms, she went to the sink to put a cloth on her face, when "next thing I know, I am on the floor and they are looking down at me."  She reports sudden syncope without any preceding syndrome beyond her vague feeling off.  She does report an increasing headache since that episode.  She reports chronic headaches, primarily at night, that she attributes to stress and poor sleep.  Further reports that she has not eaten for about 15 hours.  She has been ambulatory since this episode without focal weakness, recurrent syncope, dizziness, chest pain.   Past Medical History:  Diagnosis Date   Diabetes mellitus without complication ([place])    GERD (gastroesophageal reflux disease)    Heart murmur 1967   Hypertension 1993   Lump or mass in breast    Left   Seizures  ([place]) [place]    Patient Active Problem List   Diagnosis Date Noted   Acute cholecystitis 05/17/2019   Family history of breast cancer 01/06/2013   History of nipple discharge 01/06/2013    Past Surgical History:  Procedure Laterality Date   ABDOMINAL HYSTERECTOMY  2004   APPENDECTOMY  2014   BREAST BIOPSY Right 01-23-14   benign   BREAST SURGERY Left 01/14/12   BREAST SURGERY Right 05-16-14   benign   CHOLECYSTECTOMY N/A 05/18/2019   Procedure: LAPAROSCOPIC CHOLECYSTECTOMY;  Surgeon: Benjamine Sprague, DO;  Location: [facility] ORS;  Service: General;  Laterality: N/A;   COLONOSCOPY  2013   Dr. Candace Cruise    Prior to Admission medications   Medication Sig Start Date End Date Taking? Authorizing Provider  amLODipine (NORVASC) 10 MG tablet Take 10 mg by mouth daily.    [provider]  hydrochlorothiazide (HYDRODIURIL) 12.5 MG tablet Take 12.5 mg by mouth daily.    [provider]  lisinopril (ZESTRIL) 20 MG tablet Take 20 mg by mouth daily.    [provider]    Allergies Cashew nut oil, Shellfish allergy, and Asa [aspirin]  Family History  Problem Relation Age of Onset   Cancer Maternal Grandmother        breast   Liver cancer Mother    Cancer Mother        liver   Diabetes Maternal Aunt     Social History Social History   Tobacco Use   Smoking status: Never   Smokeless tobacco:  Never  Substance Use Topics   Alcohol use: Yes    Alcohol/week: 1.0 standard drink    Types: 1 Glasses of wine per week   Drug use: No    Review of Systems  Constitutional: No fever/chills Eyes: No visual changes. ENT: No sore throat. Cardiovascular: Denies chest pain. Respiratory: Denies shortness of breath. Gastrointestinal: No abdominal pain.  No nausea, no vomiting.  No diarrhea.  No constipation. Genitourinary: Negative for dysuria. Musculoskeletal: Negative for back pain. Skin: Negative for rash. Neurological: Negative for focal weakness or  numbness.  Positive for headache and syncope ____________________________________________  PHYSICAL EXAM:  VITAL SIGNS: Vitals:   04/30/21 0005 04/30/21 0313  BP: (!) 175/128 (!) 199/118  Pulse: 83 75  Resp: 18 18  Temp:    SpO2: 98% 99%    Constitutional: Alert and oriented. Well appearing and in no acute distress.  Obese.  Pleasant and conversational. Eyes: Conjunctivae are normal. PERRL. EOMI. Head: Atraumatic. Nose: No congestion/rhinnorhea. Mouth/Throat: Mucous membranes are moist.  Oropharynx non-erythematous. Neck: No stridor. No cervical spine tenderness to palpation. Cardiovascular: Normal rate, regular rhythm. Grossly normal heart sounds.  Good peripheral circulation. Respiratory: Normal respiratory effort.  No retractions. Lungs CTAB. Gastrointestinal: Soft , nondistended, nontender to palpation. No CVA tenderness. Musculoskeletal: No lower extremity tenderness nor edema.  No joint effusions. No signs of acute trauma. Neurologic:  Normal speech and language. No gross focal neurologic deficits are appreciated. No gait instability noted. Cranial nerves II through XII intact 5/5 strength and sensation in all 4 extremities Skin:  Skin is warm, dry and intact. No rash noted. Psychiatric: Mood and affect are normal. Speech and behavior are normal. ____________________________________________   LABS (all labs ordered are listed, but only abnormal results are displayed)  Labs Reviewed  BASIC METABOLIC PANEL - Abnormal; Notable for the following components:      Result Value   Glucose, Bld 219 (*)    All other components within normal limits  CBC - Abnormal; Notable for the following components:   RBC 5.27 (*)    Hemoglobin 15.8 (*)    HCT 46.6 (*)    All other components within normal limits  RESP PANEL BY RT-PCR (FLU A&B, COVID) [id number]  URINALYSIS, ROUTINE W REFLEX MICROSCOPIC  TROPONIN I (HIGH SENSITIVITY)  TROPONIN I (HIGH SENSITIVITY)    ____________________________________________  12 Lead EKG  Sinus rhythm, rate of 88 bpm.  Normal axis and intervals.  No evidence of acute ischemia. ____________________________________________  RADIOLOGY  ED MD interpretation: CT head reviewed by me without evidence of acute intracranial pathology.  Official radiology report(s): CT HEAD WO CONTRAST (5MM)  Result Date: 04/30/2021 CLINICAL DATA:  Syncope. EXAM: CT HEAD WITHOUT CONTRAST TECHNIQUE: Contiguous axial images were obtained from the base of the skull through the vertex without intravenous contrast. COMPARISON:  January 29, 2021 FINDINGS: Brain: No evidence of acute infarction, hemorrhage, hydrocephalus, extra-axial collection or mass lesion/mass effect. Vascular: No hyperdense vessel or unexpected calcification. Skull: Negative for an acute osseous abnormality. A stable benign-appearing lytic area is seen along the inner table of the skull within the right frontal region (axial CT image 62, CT series 2). Sinuses/Orbits: No acute finding. Other: None. IMPRESSION: No acute intracranial findings. Electronically Signed   By: Virgina Norfolk M.D.   On: 04/30/2021 03:41    ____________________________________________   PROCEDURES and INTERVENTIONS  Procedure(s) performed (including Critical Care):  .1-3 Lead EKG Interpretation Performed by: Vladimir Crofts, MD Authorized by: Vladimir Crofts, MD  Interpretation: normal     ECG rate:  76   ECG rate assessment: normal     Rhythm: sinus rhythm     Ectopy: none     Conduction: normal    Medications  butalbital-acetaminophen-caffeine (FIORICET) 50-325-40 MG per tablet 2 tablet (2 tablets Oral Given 04/30/21 0307)    ____________________________________________   MDM / ED COURSE   55 year old woman with history of poorly controlled hypertension presents to the ED after sudden syncope and collapse without associated prodrome, requiring medical observation admission.  A  persistent headache, improving with Fioricet largely unremarkable.  No evidence of ACS or concerning cardiac interval changes.  CT head without evidence of ICH considering her headache, syncope and hypertension.  Considering her sudden syncope without prodrome, a rather concerning story, we will discuss with medicine for admission and keep her on telemetry.     ____________________________________________   FINAL CLINICAL IMPRESSION(S) / ED DIAGNOSES  Final diagnoses:  Syncope and collapse  Primary hypertension     ED Discharge Orders     None        Maisee Vollman   Note:  This document was prepared using Dragon voice recognition software and may include unintentional dictation errors.    Vladimir Crofts, MD 04/30/21 [phone]

## 2021-04-30 NOTE — H&P (Signed)
[place]   PATIENT NAME: Kerry Webb    MR#:  [id number]  DATE OF BIRTH:  [birth year]  DATE OF ADMISSION:  04/30/2021  PRIMARY CARE PHYSICIAN: Juluis Pitch, MD   Patient is coming from: Home  REQUESTING/REFERRING PHYSICIAN: Vladimir Crofts, MD  CHIEF COMPLAINT:   Chief Complaint  Patient presents with  . Loss of Consciousness    HISTORY OF PRESENT ILLNESS:  Kerry Webb is a 55 y.o. African-American female with medical history significant for Diabetes mellitus, hypertension, seizures and GERD, who presented to the emergency room with concern of syncope.  The patient was having a medical visit with her nephrologist and was noted to have an elevated blood pressure.  She was sitting and stood up while they were bringing her p.o. clonidine and did not feel well been lost her consciousness before taking clonidine.  She stated that she was trying to get up to go to the sink and get cool water as she was feeling funny.  She denied any chest pain or palpitations.  She admits to paresthesias in both thighs.  No nausea or vomiting or abdominal pain.  No fever or chills.  No cough or wheezing or dyspnea.  No bleeding diathesis.  No injuries were associated with her syncope as she was caught before falling.  Presents with increasing headache after this incidence in the ER that was helped with 2 p.o. Fioricet.  ED Course: When she came to the ER blood pressure was 160/118 when she came to the ER blood pressure was 160/116 and later 199/118.  Labs revealed mild hypokalemia of 3.3 and blood glucose of 211.  CBC showed mild hemoconcentration.  High-sensitivity troponin I was 706.  Influenza antigens came back negative. EKG as reviewed by me : Showed normal sinus rhythm with rate of 88. Imaging: Chest x-ray showed no acute cardiopulmonary disease.  Noncontrasted head CT scan revealed no acute intracranial normalities.  The patient was given to p.o. Fioricet for headache.  She will  be admitted to an the patient medical monitored bed for further evaluation and management. PAST MEDICAL HISTORY:   Past Medical History:  Diagnosis Date  . Diabetes mellitus without complication ([place])   . GERD (gastroesophageal reflux disease)   . Heart murmur 1967  . Hypertension 1993  . Lump or mass in breast    Left  . Seizures ([place]) 1983  . Ulcer 1985    PAST SURGICAL HISTORY:   Past Surgical History:  Procedure Laterality Date  . ABDOMINAL HYSTERECTOMY  2004  . APPENDECTOMY  2014  . BREAST BIOPSY Right 01-23-14   benign  . BREAST SURGERY Left 01/14/12  . BREAST SURGERY Right 05-16-14   benign  . CHOLECYSTECTOMY N/A 05/18/2019   Procedure: LAPAROSCOPIC CHOLECYSTECTOMY;  Surgeon: Benjamine Sprague, DO;  Location: [facility] ORS;  Service: General;  Laterality: N/A;  . COLONOSCOPY  2013   Dr. Candace Cruise    SOCIAL HISTORY:   Social History   Tobacco Use  . Smoking status: Never  . Smokeless tobacco: Never  Substance Use Topics  . Alcohol use: Yes    Alcohol/week: 1.0 standard drink    Types: 1 Glasses of wine per week    FAMILY HISTORY:   Family History  Problem Relation Age of Onset  . Cancer Maternal Grandmother        breast  . Liver cancer Mother   . Cancer Mother        liver  . Diabetes Maternal Aunt  DRUG ALLERGIES:   Allergies  Allergen Reactions  . Cashew Nut Oil Shortness Of Breath  . Shellfish Allergy Shortness Of Breath  . Asa [Aspirin]     Intestinal bleeding    REVIEW OF SYSTEMS:   ROS As per history of present illness. All pertinent systems were reviewed above. Constitutional, HEENT, cardiovascular, respiratory, GI, GU, musculoskeletal, neuro, psychiatric, endocrine, integumentary and hematologic systems were reviewed and are otherwise negative/unremarkable except for positive findings mentioned above in the HPI.   MEDICATIONS AT HOME:   Prior to Admission medications   Medication Sig Start Date End Date Taking? Authorizing Provider   atorvastatin (LIPITOR) 40 MG tablet Take 40 mg by mouth daily.   Yes [provider]  cloNIDine (CATAPRES - DOSED IN MG/24 HR) 0.3 mg/24hr patch Place 0.3 mg onto the skin every Monday.   Yes [provider]  dapagliflozin propanediol (FARXIGA) 10 MG TABS tablet Take 10 mg by mouth every morning.   Yes [provider]  hydrALAZINE (APRESOLINE) 25 MG tablet Take 25 mg by mouth 3 (three) times daily.   Yes [provider]  hydrochlorothiazide (HYDRODIURIL) 25 MG tablet Take 25 mg by mouth daily.   Yes [provider]  insulin glargine (LANTUS) 100 UNIT/ML Solostar Pen Inject 56 Units into the skin in the morning.   Yes [provider]  losartan (COZAAR) 100 MG tablet Take 100 mg by mouth daily.   Yes [provider]  metoprolol tartrate (LOPRESSOR) 25 MG tablet Take 50 mg by mouth 2 (two) times daily.   Yes [provider]  potassium chloride SA (KLOR-CON) 20 MEQ tablet Take 20 mEq by mouth daily.   Yes [provider]  sertraline (ZOLOFT) 100 MG tablet Take 100 mg by mouth daily.   Yes [provider]      VITAL SIGNS:  Blood pressure (!) 237/137, pulse 89, temperature 98.5 F (36.9 C), temperature source Oral, resp. rate 19, height 5\' 5"  (1.651 m), weight 85.7 kg, SpO2 100 %.  PHYSICAL EXAMINATION:  Physical Exam  GENERAL:  55 y.o.-year-old Afro-American female patient lying in the bed with no acute distress.  EYES: Pupils equal, round, reactive to light and accommodation. No scleral icterus. Extraocular muscles intact.  HEENT: Head atraumatic, normocephalic. Oropharynx and nasopharynx clear.  NECK:  Supple, no jugular venous distention. No thyroid enlargement, no tenderness.  LUNGS: Normal breath sounds bilaterally, no wheezing, rales,rhonchi or crepitation. No use of accessory muscles of respiration.  CARDIOVASCULAR: Regular rate and rhythm, S1, S2 normal. No murmurs, rubs, or gallops.  ABDOMEN:  Soft, nondistended, nontender. Bowel sounds present. No organomegaly or mass.  EXTREMITIES: No pedal edema, cyanosis, or clubbing.  NEUROLOGIC: Cranial nerves II through XII are intact. Muscle strength 5/5 in all extremities. Sensation intact. Gait not checked.  PSYCHIATRIC: The patient is alert and oriented x 3.  Normal affect and good eye contact. SKIN: No obvious rash, lesion, or ulcer.   LABORATORY PANEL:   CBC Recent Labs  Lab 04/29/21 1705  WBC 7.9  HGB 15.8*  HCT 46.6*  PLT 193   ------------------------------------------------------------------------------------------------------------------  Chemistries  Recent Labs  Lab 04/29/21 1705 04/30/21 0022  NA 139  --   K 3.5  --   CL 102  --   CO2 28  --   GLUCOSE 219*  --   BUN 10  --   CREATININE 0.71  --   CALCIUM 9.6  --   MG  --  2.0   ------------------------------------------------------------------------------------------------------------------  Cardiac Enzymes No results for input(s): TROPONINI in the last 168 hours. ------------------------------------------------------------------------------------------------------------------  RADIOLOGY:  CT HEAD WO CONTRAST (5MM)  Result Date: 04/30/2021 CLINICAL DATA:  Syncope. EXAM: CT HEAD WITHOUT CONTRAST TECHNIQUE: Contiguous axial images were obtained from the base of the skull through the vertex without intravenous contrast. COMPARISON:  January 29, 2021 FINDINGS: Brain: No evidence of acute infarction, hemorrhage, hydrocephalus, extra-axial collection or mass lesion/mass effect. Vascular: No hyperdense vessel or unexpected calcification. Skull: Negative for an acute osseous abnormality. A stable benign-appearing lytic area is seen along the inner table of the skull within the right frontal region (axial CT image 62, CT series 2). Sinuses/Orbits: No acute finding. Other: None. IMPRESSION: No acute intracranial findings. Electronically Signed   By: Virgina Norfolk  M.D.   On: 04/30/2021 03:41      IMPRESSION AND PLAN:  Active Problems:   Syncope  1.  Syncope. - The patient will be admitted to an observation medically monitored bed. - Will check orthostatics q 12 hours. - Will obtain a  2D echo. - The patient will be gently hydrated with IV normal saline and monitored for arrhythmias. Differential diagnosis would include neurally mediated syncope, cardiogenic, arrhythmias related,  orthostatic hypotension and less likely hypoglycemia.  2.  Hypertensive urgency. - The patient will be placed on.  IV labetalol and hydralazine. - We will resume her Catapres 0.3 mg patch, Cozaar, Lopressor and p.o. hydralazine as well as HCTZ.  3.  Dyslipidemia. - We will continue statin therapy.  4.  Type II diabetes mellitus without complications on long-term insulin. - We will continue her basal coverage and placed on supplement coverage with NovoLog. - We will continue her [place].  5.  Depression. - We will continue Zoloft.    DVT prophylaxis: Lovenox. Code Status: full code. Family Communication:.  The plan of care was discussed in details with the patient (and family). I answered all questions. The patient agreed to proceed with the above mentioned plan. Further management will depend upon hospital course. Disposition Plan: Back to previous home environment Consults called: none. All the records are reviewed and case discussed with ED provider.  Status is: Observation  The patient remains OBS appropriate and will d/c before 2 midnights.   Dispo: The patient is from: Home              Anticipated d/c is to: Home              Patient currently is not medically stable to d/c.              Difficult to place patient: No  TOTAL TIME TAKING CARE OF THIS PATIENT: 55 minutes.     Christel Mormon M.D on 04/30/2021 at 6:52 AM  [facility]   From 7 PM-7 AM, contact night-coverage [email or web address]  CC: Primary care physician; Juluis Pitch,  MD

## 2021-04-30 NOTE — ED Notes (Signed)
Pt alert   nsr on monitor.

## 2021-04-30 NOTE — ED Notes (Signed)
Pt complaint of dull headache.

## 2021-04-30 NOTE — ED Notes (Signed)
Fsbs 120

## 2021-04-30 NOTE — ED Notes (Signed)
US at bedside

## 2021-04-30 NOTE — ED Notes (Signed)
Pt provided with breakfast tray.

## 2021-04-30 NOTE — ED Notes (Signed)
Pt st that she has decided not to leave now and would like to be seen; placed back on waiting room board

## 2021-04-30 NOTE — ED Notes (Signed)
Verbal order received to stop IVF.

## 2021-04-30 NOTE — Progress Notes (Signed)
No charge progress note.  Kerry Webb is a 55 y.o. African-American female with medical history significant for Diabetes mellitus, hypertension, seizures and GERD, who presented to the emergency room with concern of syncope.  She had an episode of syncope while visiting her nephrologist.  Blood pressure was elevated. She was sent to ED for further evaluation.Marland Kitchen  She was found to have markedly elevated blood pressure.  Apparently she was not taking her blood pressure medications regularly as she lost her job and insurance.  She was taking some of the meds, stating what ever is left behind.  She could not remember which medications she was taking.  She was having a clonidine patch and stating that needs to be changed. Harder to reapply clonidine patch.  Per patient her dizziness was improving.  Denies any chest pain or shortness of breath when seen today.  Getting her echocardiogram.  -Ordered carotid ultrasound ultrasound. -Follow-up echocardiogram -Continue home antihypertensives-on multiple medications.  Patient might need new prescriptions to be sent to [facility] management pharmacy for discharge.Marland Kitchen

## 2021-04-30 NOTE — Progress Notes (Signed)
Anticoagulation monitoring(Lovenox):  55 yo female ordered Lovenox 40 mg Q24h    Filed Weights   04/29/21 1706  Weight: 85.7 kg (189 lb)   BMI 31.45   Lab Results  Component Value Date   CREATININE 0.71 04/29/2021   CREATININE 0.68 01/29/2021   CREATININE 0.87 02/07/2020   Estimated Creatinine Clearance: 85.9 mL/min (by C-G formula based on SCr of 0.71 mg/dL). Hemoglobin & Hematocrit     Component Value Date/Time   HGB 15.8 (H) 04/29/2021 1705   HGB 13.6 03/16/2013 1054   HCT 46.6 (H) 04/29/2021 1705   HCT 40.0 03/16/2013 1054     Per Protocol for Patient with estCrcl > 30 ml/min and BMI > 30, will transition to Lovenox 42.5 mg Q24h.

## 2021-04-30 NOTE — ED Notes (Signed)
Pt up to bathroom.

## 2021-04-30 NOTE — ED Notes (Signed)
Fsbs 164

## 2021-05-01 LAB — CBG MONITORING, ED
Glucose-Capillary: 164 mg/dL — ABNORMAL HIGH (ref 70–99)
Glucose-Capillary: 126 mg/dL — ABNORMAL HIGH (ref 70–99)

## 2021-05-01 LAB — CBC
HCT: 43.1 % (ref 36.0–46.0)
Hemoglobin: 14.3 g/dL (ref 12.0–15.0)
MCH: 29.9 pg (ref 26.0–34.0)
MCHC: 33.2 g/dL (ref 30.0–36.0)
MCV: 90.2 fL (ref 80.0–100.0)
Platelets: 266 10*3/uL (ref 150–400)
RBC: 4.78 MIL/uL (ref 3.87–5.11)
RDW: 13.4 % (ref 11.5–15.5)
WBC: 8.4 10*3/uL (ref 4.0–10.5)
nRBC: 0 % (ref 0.0–0.2)

## 2021-05-01 LAB — ECHOCARDIOGRAM COMPLETE
AR max vel: 2.01 cm2
AV Area VTI: 2.09 cm2
AV Area mean vel: 1.99 cm2
AV Mean grad: 7 mmHg
AV Peak grad: 12.1 mmHg
Ao pk vel: 1.74 m/s
Area-P 1/2: 3.42 cm2
S' Lateral: 2.55 cm

## 2021-05-01 LAB — BASIC METABOLIC PANEL
Anion gap: 5 (ref 5–15)
BUN: 11 mg/dL (ref 6–20)
CO2: 30 mmol/L (ref 22–32)
Calcium: 8.8 mg/dL — ABNORMAL LOW (ref 8.9–10.3)
Chloride: 106 mmol/L (ref 98–111)
Creatinine, Ser: 0.81 mg/dL (ref 0.44–1.00)
GFR, Estimated: >60 mL/min (ref >60)
Glucose, Bld: 146 mg/dL — ABNORMAL HIGH (ref 70–99)
Potassium: 3.5 mmol/L (ref 3.5–5.1)
Sodium: 141 mmol/L (ref 135–145)

## 2021-05-01 LAB — GLUCOSE, CAPILLARY
Glucose-Capillary: 149 mg/dL — ABNORMAL HIGH (ref 70–99)
Glucose-Capillary: 155 mg/dL — ABNORMAL HIGH (ref 70–99)
Glucose-Capillary: 104 mg/dL — ABNORMAL HIGH (ref 70–99)

## 2021-05-01 LAB — HIV ANTIBODY (ROUTINE TESTING W REFLEX): HIV Screen 4th Generation wRfx: NONREACTIVE

## 2021-05-01 MED ORDER — ENOXAPARIN SODIUM 60 MG/0.6ML IJ SOSY
0.5000 mg/kg | PREFILLED_SYRINGE | INTRAMUSCULAR | Status: DC
  Administered 2021-05-01 – 2021-05-07 (×7): 42.5 mg via SUBCUTANEOUS
  Filled 2021-05-01 (×7): qty 0.6

## 2021-05-01 MED ORDER — HYDRALAZINE HCL 25 MG PO TABS
25.0000 mg | ORAL_TABLET | Freq: Three times a day (TID) | ORAL | Status: DC
  Administered 2021-05-01 – 2021-05-02 (×2): 25 mg via ORAL
  Filled 2021-05-01 (×2): qty 1

## 2021-05-01 MED ORDER — METOPROLOL TARTRATE 25 MG PO TABS: 50.0000 mg | ORAL_TABLET | Freq: Two times a day (BID) | ORAL | 0 refills | Status: DC

## 2021-05-01 MED ORDER — HYDROCHLOROTHIAZIDE 25 MG PO TABS: ORAL_TABLET | ORAL | Status: DC

## 2021-05-01 MED ORDER — CLONIDINE 0.3 MG/24HR TD PTWK: 0.3000 mg | MEDICATED_PATCH | TRANSDERMAL | 0 refills | Status: DC

## 2021-05-01 MED ORDER — HYDRALAZINE HCL 25 MG PO TABS: ORAL_TABLET | ORAL | Status: DC

## 2021-05-01 MED ORDER — LOSARTAN POTASSIUM 100 MG PO TABS: 100.0000 mg | ORAL_TABLET | Freq: Every day | ORAL | 0 refills | Status: DC

## 2021-05-01 MED ORDER — ATORVASTATIN CALCIUM 40 MG PO TABS: 40.0000 mg | ORAL_TABLET | Freq: Every day | ORAL | 0 refills | Status: DC

## 2021-05-01 MED ORDER — INFLUENZA VAC SPLIT QUAD 0.5 ML IM SUSY
0.5000 mL | PREFILLED_SYRINGE | INTRAMUSCULAR | Status: AC
  Administered 2021-05-03: 0.5 mL via INTRAMUSCULAR
  Filled 2021-05-01: qty 0.5

## 2021-05-01 NOTE — Progress Notes (Addendum)
Patient call light going off and this RN entered the room and found patient on the floor. VSS, Patient stated she was getting up to plug in her phone when she passed out on the floor. Denies pain. MD notified. Clonidine patch removed from patient. Patient placed back on stretcher.  Cannot tolerate a standing position as she experiences syncopal episodes each time with standing. BP did drop from lying to sitting position, documented in chart. Educated patient to not get OOB and to call for any assistance. Patient agreed. Patient does have some periods of confusion. MD made aware.

## 2021-05-01 NOTE — Progress Notes (Signed)
PROGRESS NOTE    Kerry Webb  GYI:[id number] DOB: 02-Dec-1965 DOA: 04/30/2021 PCP: Juluis Pitch, MD  [id number]   Assessment & Plan:   Active Problems:   Syncope and collapse   Kerry Webb is a 55 y.o. African-American female with medical history significant for Diabetes mellitus, hypertension, seizures and GERD, who presented to the emergency room with concern of syncope.  The patient was having a medical visit with her nephrologist and was noted to have an elevated blood pressure.  She was sitting and stood up while they were bringing her p.o. clonidine and did not feel well been lost her consciousness before taking clonidine.  She stated that she was trying to get up to go to the sink and get cool water as she was feeling funny.  She denied any chest pain or palpitations.  She admits to paresthesias in both thighs.  No nausea or vomiting or abdominal pain.  No fever or chills.  No cough or wheezing or dyspnea.  No bleeding diathesis.  No injuries were associated with her syncope as she was caught before falling.  Presents with increasing headache after this incidence in the ER that was helped with 2 p.o. Fioricet.  when she came to the ER blood pressure was 160/116 and later 199/118.    1.  Syncope. --carotid US, Echo, CT head remarkable.  Pt was not orthostatic.  Pt reported distant hx of syncope and being worked up for seizure, however, current episodes were unlike her prior.   --s/p gentle hydration Plan: --d/c MIVF since pt reported good oral intake --cont to monitor on tele  2.  Hypertensive urgency. - pt resumed on her Catapres 0.3 mg patch, Cozaar, Lopressor and p.o. hydralazine as well as HCTZ, and then BP dropped.  Pt was not taking all of her BP meds PTA due to having lost her insurance. Plan: --d/c clonidine patch --cont losartan, metop and hydralazine  --monitor BP  3.  Dyslipidemia. --cont statin  4.  Type II diabetes mellitus without complications  on long-term insulin, poorly controlled --A1c 9.1 --cont glargine 30u daily --SSI TID - continue Farxiga.  5.  Depression. - continue Zoloft.   DVT prophylaxis: Lovenox SQ Code Status: Full code  Family Communication:  Level of care: Med-Surg Dispo:   The patient is from: home Anticipated d/c is to: home Anticipated d/c date is: 1-2 days Patient currently is not medically ready to d/c due to: continue to have syncopal episodes, unclear etiology   Subjective and Interval History:  Pt had multiple syncope episodes today, witnessed by ED RN and later by floor RN.  Syncope appeared to occur when pt got up from lying position, however, pt's BP didn't drop, per RN.  Pt reported normal oral intake and good hydration.     Objective: Vitals:   05/01/21 0945 05/01/21 1113 05/01/21 1200 05/01/21 1641  BP: (!) 159/97 (!) 139/94 (!) 142/93 140/90  Pulse: 64 65 63 68  Resp:   16 16  Temp:   97.8 F (36.6 C) 97.8 F (36.6 C)  TempSrc:      SpO2: 100% 100% 100% 100%  Weight:      Height:        Intake/Output Summary (Last 24 hours) at 05/01/2021 1931 Last data filed at 05/01/2021 1854 Gross per 24 hour  Intake 720 ml  Output --  Net 720 ml   Filed Weights   04/29/21 1706  Weight: 85.7 kg    Examination:  Constitutional: NAD, AAOx3 HEENT: conjunctivae and lids normal, EOMI CV: No cyanosis.   RESP: normal respiratory effort, on RA Extremities: No effusions, edema in BLE SKIN: warm, dry Neuro: II - XII grossly intact.   Psych: Normal mood and affect.  Appropriate judgement and reason   Data Reviewed: I have personally reviewed following labs and imaging studies  CBC: Recent Labs  Lab 04/29/21 1705 04/30/21 0740 05/01/21 0627  WBC 7.9 9.1 8.4  HGB 15.8* 16.6* 14.3  HCT 46.6* 49.0* 43.1  MCV 88.4 89.4 90.2  PLT 193 149* 272   Basic Metabolic Panel: Recent Labs  Lab 04/29/21 1705 04/30/21 0022 04/30/21 0740 05/01/21 0627  NA 139  --   --  141  K 3.5   --   --  3.5  CL 102  --   --  106  CO2 28  --   --  30  GLUCOSE 219*  --   --  146*  BUN 10  --   --  11  CREATININE 0.71  --  0.69 0.81  CALCIUM 9.6  --   --  8.8*  MG  --  2.0  --   --    GFR: Estimated Creatinine Clearance: 84.9 mL/min (by C-G formula based on SCr of 0.81 mg/dL). Liver Function Tests: No results for input(s): AST, ALT, ALKPHOS, BILITOT, PROT, ALBUMIN in the last 168 hours. No results for input(s): LIPASE, AMYLASE in the last 168 hours. No results for input(s): AMMONIA in the last 168 hours. Coagulation Profile: No results for input(s): INR, PROTIME in the last 168 hours. Cardiac Enzymes: No results for input(s): CKTOTAL, CKMB, CKMBINDEX, TROPONINI in the last 168 hours. BNP (last 3 results) No results for input(s): PROBNP in the last 8760 hours. HbA1C: Recent Labs    04/29/21 1705  HGBA1C 9.1*   CBG: Recent Labs  Lab 04/30/21 2146 05/01/21 0536 05/01/21 0847 05/01/21 1201 05/01/21 1642  GLUCAP 164* 164* 126* 149* 155*   Lipid Profile: No results for input(s): CHOL, HDL, LDLCALC, TRIG, CHOLHDL, LDLDIRECT in the last 72 hours. Thyroid Function Tests: No results for input(s): TSH, T4TOTAL, FREET4, T3FREE, THYROIDAB in the last 72 hours. Anemia Panel: No results for input(s): VITAMINB12, FOLATE, FERRITIN, TIBC, IRON, RETICCTPCT in the last 72 hours. Sepsis Labs: No results for input(s): PROCALCITON, LATICACIDVEN in the last 168 hours.  Recent Results (from the past 240 hour(s))  Resp Panel by RT-PCR (Flu A&B, Covid) Nasopharyngeal Swab     Status: None   Collection Time: 04/30/21  3:11 AM   Specimen: Nasopharyngeal Swab; Nasopharyngeal(NP) swabs in vial transport medium  Result Value Ref Range Status   SARS Coronavirus 2 by RT PCR NEGATIVE NEGATIVE Final    Comment: (NOTE) SARS-CoV-2 target nucleic acids are NOT DETECTED.  The SARS-CoV-2 RNA is generally detectable in upper respiratory specimens during the acute phase of infection. The  lowest concentration of SARS-CoV-2 viral copies this assay can detect is 138 copies/mL. A negative result does not preclude SARS-Cov-2 infection and should not be used as the sole basis for treatment or other patient management decisions. A negative result may occur with  improper specimen collection/handling, submission of specimen other than nasopharyngeal swab, presence of viral mutation(s) within the areas targeted by this assay, and inadequate number of viral copies(<138 copies/mL). A negative result must be combined with clinical observations, patient history, and epidemiological information. The expected result is Negative.  Fact Sheet for Patients:  EntrepreneurPulse.com.au  Fact Sheet for Healthcare Providers:  IncredibleEmployment.be  This test is no t yet approved or cleared by the [place] and  has been authorized for detection and/or diagnosis of SARS-CoV-2 by FDA under an Emergency Use Authorization (EUA). This EUA will remain  in effect (meaning this test can be used) for the duration of the COVID-19 declaration under Section 564(b)(1) of the Act, 21 U.S.C.section 360bbb-3(b)(1), unless the authorization is terminated  or revoked sooner.       Influenza A by PCR NEGATIVE NEGATIVE Final   Influenza B by PCR NEGATIVE NEGATIVE Final    Comment: (NOTE) The Xpert Xpress SARS-CoV-2/FLU/RSV plus assay is intended as an aid in the diagnosis of influenza from Nasopharyngeal swab specimens and should not be used as a sole basis for treatment. Nasal washings and aspirates are unacceptable for Xpert Xpress SARS-CoV-2/FLU/RSV testing.  Fact Sheet for Patients: EntrepreneurPulse.com.au  Fact Sheet for Healthcare Providers: IncredibleEmployment.be  This test is not yet approved or cleared by the [place] FDA and has been authorized for detection and/or diagnosis of SARS-CoV-2 by FDA under  an Emergency Use Authorization (EUA). This EUA will remain in effect (meaning this test can be used) for the duration of the COVID-19 declaration under Section 564(b)(1) of the Act, 21 U.S.C. section 360bbb-3(b)(1), unless the authorization is terminated or revoked.  Performed at [facility], [address] [place],  [address]       Radiology Studies: CT HEAD WO CONTRAST (5MM)  Result Date: 04/30/2021 CLINICAL DATA:  Syncope. EXAM: CT HEAD WITHOUT CONTRAST TECHNIQUE: Contiguous axial images were obtained from the base of the skull through the vertex without intravenous contrast. COMPARISON:  January 29, 2021 FINDINGS: Brain: No evidence of acute infarction, hemorrhage, hydrocephalus, extra-axial collection or mass lesion/mass effect. Vascular: No hyperdense vessel or unexpected calcification. Skull: Negative for an acute osseous abnormality. A stable benign-appearing lytic area is seen along the inner table of the skull within the right frontal region (axial CT image 62, CT series 2). Sinuses/Orbits: No acute finding. Other: None. IMPRESSION: No acute intracranial findings. Electronically Signed   By: Virgina Norfolk M.D.   On: 04/30/2021 03:41   US Carotid Bilateral  Result Date: 04/30/2021 CLINICAL DATA:  Syncope, hypertension, visual disturbance and diabetes EXAM: BILATERAL CAROTID DUPLEX ULTRASOUND TECHNIQUE: Pearline Cables scale imaging, color Doppler and duplex ultrasound were performed of bilateral carotid and vertebral arteries in the neck. COMPARISON:  None. FINDINGS: Criteria: Quantification of carotid stenosis is based on velocity parameters that correlate the residual internal carotid diameter with NASCET-based stenosis levels, using the diameter of the distal internal carotid lumen as the denominator for stenosis measurement. The following velocity measurements were obtained: RIGHT ICA: 66/29 cm/sec CCA: 62/94 cm/sec SYSTOLIC ICA/CCA RATIO:  0.8 ECA: 93 cm/sec LEFT ICA:  73/26 cm/sec CCA: 76/54 cm/sec SYSTOLIC ICA/CCA RATIO:  0.8 ECA: 121 cm/sec RIGHT CAROTID ARTERY: Minor echogenic shadowing plaque formation. No hemodynamically significant right ICA stenosis, velocity elevation, or turbulent flow. Degree of narrowing less than 50%. RIGHT VERTEBRAL ARTERY:  Normal antegrade flow LEFT CAROTID ARTERY: Similar scattered minor echogenic plaque formation. No hemodynamically significant left ICA stenosis, velocity elevation, or turbulent flow. LEFT VERTEBRAL ARTERY:  Normal antegrade flow IMPRESSION: Minor carotid atherosclerosis. Negative for stenosis. Degree of narrowing less than 50% bilaterally by ultrasound criteria. Patent antegrade vertebral flow bilaterally Electronically Signed   By: Jerilynn Mages.  Shick M.D.   On: 04/30/2021 10:10   ECHOCARDIOGRAM COMPLETE  Result Date: 05/01/2021    ECHOCARDIOGRAM REPORT   Patient Name:  Kerry Webb Date of Exam: 04/30/2021 Medical Rec #:  [id number]          Height:       65.0 in Accession #:    [id number]         Weight:       189.0 lb Date of Birth:  [birth year]          BSA:          1.931 m Patient Age:    61 years           BP:           132/79 mmHg Patient Gender: F                  HR:           74 bpm. Exam Location:  [facility] Procedure: 2D Echo, Cardiac Doppler and Color Doppler Indications:     R55 Syncope  History:         Patient has no prior history of Echocardiogram examinations.                  Signs/Symptoms:Murmur; Risk Factors:Hypertension and Diabetes.  Sonographer:     Cresenciano Lick RDCS Referring Phys:  [id number] Arvella Merles MANSY Diagnosing Phys: Nelva Bush MD IMPRESSIONS  1. Left ventricular ejection fraction, by estimation, is 60 to 65%. The left ventricle has normal function. The left ventricle has no regional wall motion abnormalities. There is mild left ventricular hypertrophy. Left ventricular diastolic parameters are consistent with Grade I diastolic dysfunction (impaired relaxation).  2. Right ventricular  systolic function is normal. The right ventricular size is normal. Tricuspid regurgitation signal is inadequate for assessing PA pressure.  3. The mitral valve is normal in structure. Trivial mitral valve regurgitation. No evidence of mitral stenosis.  4. The aortic valve is tricuspid. There is mild calcification of the aortic valve. There is mild thickening of the aortic valve. Aortic valve regurgitation is not visualized. Mild aortic valve sclerosis is present, with no evidence of aortic valve stenosis.  5. The inferior vena cava is normal in size with greater than 50% respiratory variability, suggesting right atrial pressure of 3 mmHg. FINDINGS  Left Ventricle: Left ventricular ejection fraction, by estimation, is 60 to 65%. The left ventricle has normal function. The left ventricle has no regional wall motion abnormalities. The left ventricular internal cavity size was normal in size. There is  mild left ventricular hypertrophy. Left ventricular diastolic parameters are consistent with Grade I diastolic dysfunction (impaired relaxation). Right Ventricle: The right ventricular size is normal. No increase in right ventricular wall thickness. Right ventricular systolic function is normal. Tricuspid regurgitation signal is inadequate for assessing PA pressure. Left Atrium: Left atrial size was normal in size. Right Atrium: Right atrial size was normal in size. Pericardium: Trivial pericardial effusion is present. Mitral Valve: The mitral valve is normal in structure. Trivial mitral valve regurgitation. No evidence of mitral valve stenosis. Tricuspid Valve: The tricuspid valve is normal in structure. Tricuspid valve regurgitation is not demonstrated. Aortic Valve: The aortic valve is tricuspid. There is mild calcification of the aortic valve. There is mild thickening of the aortic valve. There is mild aortic valve annular calcification. Aortic valve regurgitation is not visualized. Mild aortic valve sclerosis is  present, with no evidence of aortic valve stenosis. Aortic valve mean gradient measures 7.0 mmHg. Aortic valve peak gradient measures 12.1 mmHg. Aortic valve area, by VTI measures 2.09 cm. Pulmonic Valve: The pulmonic valve  was not well visualized. Pulmonic valve regurgitation is not visualized. No evidence of pulmonic stenosis. Aorta: The aortic root and ascending aorta are structurally normal, with no evidence of dilitation. Pulmonary Artery: The pulmonary artery is not well seen. Venous: The inferior vena cava is normal in size with greater than 50% respiratory variability, suggesting right atrial pressure of 3 mmHg. IAS/Shunts: No atrial level shunt detected by color flow Doppler.  LEFT VENTRICLE PLAX 2D LVIDd:         4.30 cm   Diastology LVIDs:         2.55 cm   LV e' medial:    7.18 cm/s LV PW:         1.10 cm   LV E/e' medial:  13.2 LV IVS:        1.00 cm   LV e' lateral:   5.55 cm/s LVOT diam:     1.90 cm   LV E/e' lateral: 17.0 LV SV:         74 LV SV Index:   38 LVOT Area:     2.84 cm  RIGHT VENTRICLE             IVC RV Basal diam:  3.00 cm     IVC diam: 1.50 cm RV S prime:     13.40 cm/s TAPSE (M-mode): 2.9 cm LEFT ATRIUM             Index        RIGHT ATRIUM          Index LA diam:        4.00 cm 2.07 cm/m   RA Area:     9.59 cm LA Vol (A2C):   50.8 ml 26.31 ml/m  RA Volume:   19.60 ml 10.15 ml/m LA Vol (A4C):   34.7 ml 17.97 ml/m LA Biplane Vol: 44.1 ml 22.84 ml/m  AORTIC VALVE AV Area (Vmax):    2.01 cm AV Area (Vmean):   1.99 cm AV Area (VTI):     2.09 cm AV Vmax:           174.00 cm/s AV Vmean:          128.000 cm/s AV VTI:            0.353 m AV Peak Grad:      12.1 mmHg AV Mean Grad:      7.0 mmHg LVOT Vmax:         123.50 cm/s LVOT Vmean:        89.900 cm/s LVOT VTI:          0.260 m LVOT/AV VTI ratio: 0.74  AORTA Ao Root diam: 2.80 cm Ao Asc diam:  2.90 cm MITRAL VALVE MV Area (PHT): 3.42 cm    SHUNTS MV Decel Time: 222 msec    Systemic VTI:  0.26 m MV E velocity: 94.60 cm/s  Systemic  Diam: 1.90 cm MV A velocity: 97.90 cm/s MV E/A ratio:  0.97 Christopher End MD Electronically signed by Nelva Bush MD Signature Date/Time: 05/01/2021/6:55:19 AM    Final      Scheduled Meds:  atorvastatin  40 mg Oral Daily   dapagliflozin propanediol  10 mg Oral q morning   enoxaparin (LOVENOX) injection  0.5 mg/kg Subcutaneous Q24H   insulin aspart  0-15 Units Subcutaneous TID AC & HS   insulin glargine-yfgn  30 Units Subcutaneous q AM   losartan  100 mg Oral Daily   metoprolol tartrate  50 mg Oral BID  potassium chloride SA  20 mEq Oral Daily   sertraline  100 mg Oral Daily   sodium chloride flush  3 mL Intravenous Q12H   Continuous Infusions:   LOS: 0 days     Enzo Bi, MD [facility] If 7PM-7AM, please contact night-coverage 05/01/2021, 7:31 PM

## 2021-05-02 ENCOUNTER — Encounter: Payer: Self-pay | Admitting: Hospitalist

## 2021-05-02 LAB — URINALYSIS, ROUTINE W REFLEX MICROSCOPIC
Bilirubin Urine: NEGATIVE
Glucose, UA: >=500 mg/dL — AB
Hgb urine dipstick: NEGATIVE
Ketones, ur: NEGATIVE mg/dL
Leukocytes,Ua: NEGATIVE
Nitrite: NEGATIVE
Protein, ur: NEGATIVE mg/dL
Specific Gravity, Urine: 1.025 (ref 1.005–1.030)
pH: 7 (ref 5.0–8.0)

## 2021-05-02 LAB — MAGNESIUM: Magnesium: 2.3 mg/dL (ref 1.7–2.4)

## 2021-05-02 LAB — BASIC METABOLIC PANEL
Anion gap: 6 (ref 5–15)
BUN: 11 mg/dL (ref 6–20)
CO2: 26 mmol/L (ref 22–32)
Calcium: 8.3 mg/dL — ABNORMAL LOW (ref 8.9–10.3)
Chloride: 107 mmol/L (ref 98–111)
Creatinine, Ser: 0.79 mg/dL (ref 0.44–1.00)
GFR, Estimated: >60 mL/min (ref >60)
Glucose, Bld: 168 mg/dL — ABNORMAL HIGH (ref 70–99)
Potassium: 3.2 mmol/L — ABNORMAL LOW (ref 3.5–5.1)
Sodium: 139 mmol/L (ref 135–145)

## 2021-05-02 LAB — GLUCOSE, CAPILLARY
Glucose-Capillary: 148 mg/dL — ABNORMAL HIGH (ref 70–99)
Glucose-Capillary: 113 mg/dL — ABNORMAL HIGH (ref 70–99)
Glucose-Capillary: 100 mg/dL — ABNORMAL HIGH (ref 70–99)
Glucose-Capillary: 178 mg/dL — ABNORMAL HIGH (ref 70–99)

## 2021-05-02 LAB — CBC
HCT: 42.1 % (ref 36.0–46.0)
Hemoglobin: 14.5 g/dL (ref 12.0–15.0)
MCH: 30.8 pg (ref 26.0–34.0)
MCHC: 34.4 g/dL (ref 30.0–36.0)
MCV: 89.4 fL (ref 80.0–100.0)
Platelets: 272 10*3/uL (ref 150–400)
RBC: 4.71 MIL/uL (ref 3.87–5.11)
RDW: 13.1 % (ref 11.5–15.5)
WBC: 8.1 10*3/uL (ref 4.0–10.5)
nRBC: 0.2 % (ref 0.0–0.2)

## 2021-05-02 MED ORDER — HYDRALAZINE HCL 50 MG PO TABS
50.0000 mg | ORAL_TABLET | Freq: Three times a day (TID) | ORAL | Status: DC
  Administered 2021-05-02 – 2021-05-03 (×3): 50 mg via ORAL
  Filled 2021-05-02 (×3): qty 1

## 2021-05-02 MED ORDER — HYDROCHLOROTHIAZIDE 25 MG PO TABS
50.0000 mg | ORAL_TABLET | Freq: Every day | ORAL | Status: DC
  Administered 2021-05-02 – 2021-05-07 (×5): 50 mg via ORAL
  Filled 2021-05-02 (×5): qty 2

## 2021-05-02 MED ORDER — METOPROLOL TARTRATE 50 MG PO TABS
100.0000 mg | ORAL_TABLET | Freq: Two times a day (BID) | ORAL | Status: DC
  Administered 2021-05-02: 100 mg via ORAL
  Filled 2021-05-02: qty 2

## 2021-05-02 MED ORDER — HYDRALAZINE HCL 50 MG PO TABS
100.0000 mg | ORAL_TABLET | Freq: Three times a day (TID) | ORAL | Status: DC
  Administered 2021-05-02: 100 mg via ORAL
  Filled 2021-05-02: qty 2

## 2021-05-02 MED ORDER — METOPROLOL TARTRATE 50 MG PO TABS
75.0000 mg | ORAL_TABLET | Freq: Two times a day (BID) | ORAL | Status: DC
  Administered 2021-05-02 – 2021-05-07 (×7): 75 mg via ORAL
  Filled 2021-05-02 (×7): qty 1

## 2021-05-02 NOTE — Progress Notes (Signed)
Patient up to bathroom to void, when going back to bed, patient sat in the bed and then fainted for approximately 45 seconds. Dr. Billie Ruddy made aware.

## 2021-05-02 NOTE — Progress Notes (Signed)
PROGRESS NOTE    Kerry Webb  ZYS:[id number] DOB: 11-21-1965 DOA: 04/30/2021 PCP: Juluis Pitch, MD  225A/225A-AA   Assessment & Plan:   Active Problems:   Syncope and collapse   Kerry Webb is a 55 y.o. African-American female with medical history significant for Diabetes mellitus, hypertension, seizures and GERD, who presented to the emergency room with concern of syncope.  The patient was having a medical visit with her nephrologist and was noted to have an elevated blood pressure.  She was sitting and stood up while they were bringing her p.o. clonidine and did not feel well been lost her consciousness before taking clonidine.  She stated that she was trying to get up to go to the sink and get cool water as she was feeling funny.  She denied any chest pain or palpitations.  She admits to paresthesias in both thighs.  No nausea or vomiting or abdominal pain.  No fever or chills.  No cough or wheezing or dyspnea.  No bleeding diathesis.  No injuries were associated with her syncope as she was caught before falling.  Presents with increasing headache after this incidence in the ER that was helped with 2 p.o. Fioricet.  when she came to the ER blood pressure was 160/116 and later 199/118.    1.  Syncope. --carotid US, Echo, CT head remarkable.  Pt was not orthostatic.  Tele with NSR and no alarm events.  Pt reported distant hx of syncope and being worked up for seizure, however, current episodes were unlike her prior.   --s/p gentle hydration --continues to have multiple syncope events even with elevated BP. Plan: --neuro consult tomorrow  2.  Hypertensive urgency. - pt resumed on her Catapres 0.3 mg patch, Cozaar, Lopressor and p.o. hydralazine as well as HCTZ on admission, and then BP dropped.  Pt was not taking all of her BP meds PTA due to having lost her insurance. --clonidine patch d/c'ed due to risk of severe rebound hypertension Plan: --increase home hydralazine  to 50 TID --increase home HCTZ tto 50 mg daily --increase home lopressor to 75 mg BID --cont home losarrtan 100 mg daily  3.  Dyslipidemia. --cont statin  4.  Type II diabetes mellitus without complications on long-term insulin, poorly controlled --A1c 9.1 --cont glargine 30u daily --SSI TID - continue Farxiga.  5.  Depression. --cont Zoloft   DVT prophylaxis: Lovenox SQ Code Status: Full code  Family Communication:  Level of care: Med-Surg Dispo:   The patient is from: home Anticipated d/c is to: home Anticipated d/c date is: 1-2 days Patient currently is not medically ready to d/c due to: continue to have syncopal episodes, unclear etiology   Subjective and Interval History:  Normal oral intake, continues to have syncope events witnessed by RN.   Objective: Vitals:   05/02/21 0858 05/02/21 1100 05/02/21 1306 05/02/21 1600  BP: (!) 194/113 (!) 168/88 139/90 (!) 158/100  Pulse: 87  61 68  Resp: 18  18 17   Temp: 97.8 F (36.6 C)  97.9 F (36.6 C) 97.9 F (36.6 C)  TempSrc:   Oral   SpO2: 100%  100% 100%  Weight:      Height:        Intake/Output Summary (Last 24 hours) at 05/02/2021 1848 Last data filed at 05/01/2021 2300 Gross per 24 hour  Intake 243 ml  Output 650 ml  Net -407 ml   Filed Weights   04/29/21 1706 05/02/21 0500  Weight: 85.7 kg  85.2 kg    Examination:   Constitutional: NAD, AAOx3 HEENT: conjunctivae and lids normal, EOMI CV: No cyanosis.   RESP: normal respiratory effort, on RA SKIN: warm, dry Neuro: II - XII grossly intact.   Psych: Normal mood and affect.  Appropriate judgement and reason   Data Reviewed: I have personally reviewed following labs and imaging studies  CBC: Recent Labs  Lab 04/29/21 1705 04/30/21 0740 05/01/21 0627 05/02/21 0621  WBC 7.9 9.1 8.4 8.1  HGB 15.8* 16.6* 14.3 14.5  HCT 46.6* 49.0* 43.1 42.1  MCV 88.4 89.4 90.2 89.4  PLT 193 149* 266 326   Basic Metabolic Panel: Recent Labs  Lab  04/29/21 1705 04/30/21 0022 04/30/21 0740 05/01/21 0627 05/02/21 0621  NA 139  --   --  141 139  K 3.5  --   --  3.5 3.2*  CL 102  --   --  106 107  CO2 28  --   --  30 26  GLUCOSE 219*  --   --  146* 168*  BUN 10  --   --  11 11  CREATININE 0.71  --  0.69 0.81 0.79  CALCIUM 9.6  --   --  8.8* 8.3*  MG  --  2.0  --   --  2.3   GFR: Estimated Creatinine Clearance: 85.7 mL/min (by C-G formula based on SCr of 0.79 mg/dL). Liver Function Tests: No results for input(s): AST, ALT, ALKPHOS, BILITOT, PROT, ALBUMIN in the last 168 hours. No results for input(s): LIPASE, AMYLASE in the last 168 hours. No results for input(s): AMMONIA in the last 168 hours. Coagulation Profile: No results for input(s): INR, PROTIME in the last 168 hours. Cardiac Enzymes: No results for input(s): CKTOTAL, CKMB, CKMBINDEX, TROPONINI in the last 168 hours. BNP (last 3 results) No results for input(s): PROBNP in the last 8760 hours. HbA1C: No results for input(s): HGBA1C in the last 72 hours.  CBG: Recent Labs  Lab 05/01/21 1642 05/01/21 2107 05/02/21 0542 05/02/21 1210 05/02/21 1701  GLUCAP 155* 104* 148* 113* 100*   Lipid Profile: No results for input(s): CHOL, HDL, LDLCALC, TRIG, CHOLHDL, LDLDIRECT in the last 72 hours. Thyroid Function Tests: No results for input(s): TSH, T4TOTAL, FREET4, T3FREE, THYROIDAB in the last 72 hours. Anemia Panel: No results for input(s): VITAMINB12, FOLATE, FERRITIN, TIBC, IRON, RETICCTPCT in the last 72 hours. Sepsis Labs: No results for input(s): PROCALCITON, LATICACIDVEN in the last 168 hours.  Recent Results (from the past 240 hour(s))  Resp Panel by RT-PCR (Flu A&B, Covid) Nasopharyngeal Swab     Status: None   Collection Time: 04/30/21  3:11 AM   Specimen: Nasopharyngeal Swab; Nasopharyngeal(NP) swabs in vial transport medium  Result Value Ref Range Status   SARS Coronavirus 2 by RT PCR NEGATIVE NEGATIVE Final    Comment: (NOTE) SARS-CoV-2 target  nucleic acids are NOT DETECTED.  The SARS-CoV-2 RNA is generally detectable in upper respiratory specimens during the acute phase of infection. The lowest concentration of SARS-CoV-2 viral copies this assay can detect is 138 copies/mL. A negative result does not preclude SARS-Cov-2 infection and should not be used as the sole basis for treatment or other patient management decisions. A negative result may occur with  improper specimen collection/handling, submission of specimen other than nasopharyngeal swab, presence of viral mutation(s) within the areas targeted by this assay, and inadequate number of viral copies(<138 copies/mL). A negative result must be combined with clinical observations, patient history, and epidemiological information.  The expected result is Negative.  Fact Sheet for Patients:  EntrepreneurPulse.com.au  Fact Sheet for Healthcare Providers:  IncredibleEmployment.be  This test is no t yet approved or cleared by the [place] FDA and  has been authorized for detection and/or diagnosis of SARS-CoV-2 by FDA under an Emergency Use Authorization (EUA). This EUA will remain  in effect (meaning this test can be used) for the duration of the COVID-19 declaration under Section 564(b)(1) of the Act, 21 U.S.C.section 360bbb-3(b)(1), unless the authorization is terminated  or revoked sooner.       Influenza A by PCR NEGATIVE NEGATIVE Final   Influenza B by PCR NEGATIVE NEGATIVE Final    Comment: (NOTE) The Xpert Xpress SARS-CoV-2/FLU/RSV plus assay is intended as an aid in the diagnosis of influenza from Nasopharyngeal swab specimens and should not be used as a sole basis for treatment. Nasal washings and aspirates are unacceptable for Xpert Xpress SARS-CoV-2/FLU/RSV testing.  Fact Sheet for Patients: EntrepreneurPulse.com.au  Fact Sheet for Healthcare  Providers: IncredibleEmployment.be  This test is not yet approved or cleared by the [place] FDA and has been authorized for detection and/or diagnosis of SARS-CoV-2 by FDA under an Emergency Use Authorization (EUA). This EUA will remain in effect (meaning this test can be used) for the duration of the COVID-19 declaration under Section 564(b)(1) of the Act, 21 U.S.C. section 360bbb-3(b)(1), unless the authorization is terminated or revoked.  Performed at [facility], [address] [place], [place] [address]       Radiology Studies: ECHOCARDIOGRAM COMPLETE  Result Date: 05/01/2021    ECHOCARDIOGRAM REPORT   Patient Name:   Kerry Webb Date of Exam: 04/30/2021 Medical Rec #:  [id number]          Height:       65.0 in Accession #:    [id number]         Weight:       189.0 lb Date of Birth:  [birth year]          BSA:          1.931 m Patient Age:    70 years           BP:           132/79 mmHg Patient Gender: F                  HR:           74 bpm. Exam Location:  [facility] Procedure: 2D Echo, Cardiac Doppler and Color Doppler Indications:     R55 Syncope  History:         Patient has no prior history of Echocardiogram examinations.                  Signs/Symptoms:Murmur; Risk Factors:Hypertension and Diabetes.  Sonographer:     Cresenciano Lick RDCS Referring Phys:  [id number] Arvella Merles MANSY Diagnosing Phys: Nelva Bush MD IMPRESSIONS  1. Left ventricular ejection fraction, by estimation, is 60 to 65%. The left ventricle has normal function. The left ventricle has no regional wall motion abnormalities. There is mild left ventricular hypertrophy. Left ventricular diastolic parameters are consistent with Grade I diastolic dysfunction (impaired relaxation).  2. Right ventricular systolic function is normal. The right ventricular size is normal. Tricuspid regurgitation signal is inadequate for assessing PA pressure.  3. The mitral valve is normal in structure.  Trivial mitral valve regurgitation. No evidence of mitral stenosis.  4. The aortic valve is tricuspid. There  is mild calcification of the aortic valve. There is mild thickening of the aortic valve. Aortic valve regurgitation is not visualized. Mild aortic valve sclerosis is present, with no evidence of aortic valve stenosis.  5. The inferior vena cava is normal in size with greater than 50% respiratory variability, suggesting right atrial pressure of 3 mmHg. FINDINGS  Left Ventricle: Left ventricular ejection fraction, by estimation, is 60 to 65%. The left ventricle has normal function. The left ventricle has no regional wall motion abnormalities. The left ventricular internal cavity size was normal in size. There is  mild left ventricular hypertrophy. Left ventricular diastolic parameters are consistent with Grade I diastolic dysfunction (impaired relaxation). Right Ventricle: The right ventricular size is normal. No increase in right ventricular wall thickness. Right ventricular systolic function is normal. Tricuspid regurgitation signal is inadequate for assessing PA pressure. Left Atrium: Left atrial size was normal in size. Right Atrium: Right atrial size was normal in size. Pericardium: Trivial pericardial effusion is present. Mitral Valve: The mitral valve is normal in structure. Trivial mitral valve regurgitation. No evidence of mitral valve stenosis. Tricuspid Valve: The tricuspid valve is normal in structure. Tricuspid valve regurgitation is not demonstrated. Aortic Valve: The aortic valve is tricuspid. There is mild calcification of the aortic valve. There is mild thickening of the aortic valve. There is mild aortic valve annular calcification. Aortic valve regurgitation is not visualized. Mild aortic valve sclerosis is present, with no evidence of aortic valve stenosis. Aortic valve mean gradient measures 7.0 mmHg. Aortic valve peak gradient measures 12.1 mmHg. Aortic valve area, by VTI measures 2.09  cm. Pulmonic Valve: The pulmonic valve was not well visualized. Pulmonic valve regurgitation is not visualized. No evidence of pulmonic stenosis. Aorta: The aortic root and ascending aorta are structurally normal, with no evidence of dilitation. Pulmonary Artery: The pulmonary artery is not well seen. Venous: The inferior vena cava is normal in size with greater than 50% respiratory variability, suggesting right atrial pressure of 3 mmHg. IAS/Shunts: No atrial level shunt detected by color flow Doppler.  LEFT VENTRICLE PLAX 2D LVIDd:         4.30 cm   Diastology LVIDs:         2.55 cm   LV e' medial:    7.18 cm/s LV PW:         1.10 cm   LV E/e' medial:  13.2 LV IVS:        1.00 cm   LV e' lateral:   5.55 cm/s LVOT diam:     1.90 cm   LV E/e' lateral: 17.0 LV SV:         74 LV SV Index:   38 LVOT Area:     2.84 cm  RIGHT VENTRICLE             IVC RV Basal diam:  3.00 cm     IVC diam: 1.50 cm RV S prime:     13.40 cm/s TAPSE (M-mode): 2.9 cm LEFT ATRIUM             Index        RIGHT ATRIUM          Index LA diam:        4.00 cm 2.07 cm/m   RA Area:     9.59 cm LA Vol (A2C):   50.8 ml 26.31 ml/m  RA Volume:   19.60 ml 10.15 ml/m LA Vol (A4C):   34.7 ml 17.97 ml/m LA Biplane Vol:  44.1 ml 22.84 ml/m  AORTIC VALVE AV Area (Vmax):    2.01 cm AV Area (Vmean):   1.99 cm AV Area (VTI):     2.09 cm AV Vmax:           174.00 cm/s AV Vmean:          128.000 cm/s AV VTI:            0.353 m AV Peak Grad:      12.1 mmHg AV Mean Grad:      7.0 mmHg LVOT Vmax:         123.50 cm/s LVOT Vmean:        89.900 cm/s LVOT VTI:          0.260 m LVOT/AV VTI ratio: 0.74  AORTA Ao Root diam: 2.80 cm Ao Asc diam:  2.90 cm MITRAL VALVE MV Area (PHT): 3.42 cm    SHUNTS MV Decel Time: 222 msec    Systemic VTI:  0.26 m MV E velocity: 94.60 cm/s  Systemic Diam: 1.90 cm MV A velocity: 97.90 cm/s MV E/A ratio:  0.97 Christopher End MD Electronically signed by Nelva Bush MD Signature Date/Time: 05/01/2021/6:55:19 AM    Final       Scheduled Meds:  atorvastatin  40 mg Oral Daily   dapagliflozin propanediol  10 mg Oral q morning   enoxaparin (LOVENOX) injection  0.5 mg/kg Subcutaneous Q24H   hydrALAZINE  50 mg Oral TID   hydrochlorothiazide  50 mg Oral Daily   influenza vac split quadrivalent PF  0.5 mL Intramuscular Tomorrow-1000   insulin aspart  0-15 Units Subcutaneous TID AC & HS   insulin glargine-yfgn  30 Units Subcutaneous q AM   losartan  100 mg Oral Daily   metoprolol tartrate  75 mg Oral BID   potassium chloride SA  20 mEq Oral Daily   sertraline  100 mg Oral Daily   sodium chloride flush  3 mL Intravenous Q12H   Continuous Infusions:   LOS: 1 day     Enzo Bi, MD [facility] If 7PM-7AM, please contact night-coverage 05/02/2021, 6:48 PM

## 2021-05-02 NOTE — Progress Notes (Signed)
Chaplain Maggie visited pt at bedside for Advanced Directive education. Prayer was also central to this visit. Pt's faith supports her life in relationship with her "Father in [place]." Continued spiritual care and coordination of notary and witnesses for AD signatures available per on call chaplain.

## 2021-05-03 ENCOUNTER — Other Ambulatory Visit: Payer: Self-pay

## 2021-05-03 DIAGNOSIS — R4689 Other symptoms and signs involving appearance and behavior: Secondary | ICD-10-CM

## 2021-05-03 LAB — GLUCOSE, CAPILLARY
Glucose-Capillary: 152 mg/dL — ABNORMAL HIGH (ref 70–99)
Glucose-Capillary: 178 mg/dL — ABNORMAL HIGH (ref 70–99)
Glucose-Capillary: 145 mg/dL — ABNORMAL HIGH (ref 70–99)
Glucose-Capillary: 167 mg/dL — ABNORMAL HIGH (ref 70–99)
Glucose-Capillary: 167 mg/dL — ABNORMAL HIGH (ref 70–99)

## 2021-05-03 LAB — BASIC METABOLIC PANEL
Anion gap: 8 (ref 5–15)
BUN: 13 mg/dL (ref 6–20)
CO2: 27 mmol/L (ref 22–32)
Calcium: 9 mg/dL (ref 8.9–10.3)
Chloride: 104 mmol/L (ref 98–111)
Creatinine, Ser: 0.84 mg/dL (ref 0.44–1.00)
GFR, Estimated: >60 mL/min (ref >60)
Glucose, Bld: 156 mg/dL — ABNORMAL HIGH (ref 70–99)
Potassium: 2.9 mmol/L — ABNORMAL LOW (ref 3.5–5.1)
Sodium: 139 mmol/L (ref 135–145)

## 2021-05-03 LAB — CBC
HCT: 50.2 % — ABNORMAL HIGH (ref 36.0–46.0)
Hemoglobin: 17.4 g/dL — ABNORMAL HIGH (ref 12.0–15.0)
MCH: 30.9 pg (ref 26.0–34.0)
MCHC: 34.7 g/dL (ref 30.0–36.0)
MCV: 89 fL (ref 80.0–100.0)
Platelets: 287 10*3/uL (ref 150–400)
RBC: 5.64 MIL/uL — ABNORMAL HIGH (ref 3.87–5.11)
RDW: 13 % (ref 11.5–15.5)
WBC: 7.1 10*3/uL (ref 4.0–10.5)
nRBC: 0 % (ref 0.0–0.2)

## 2021-05-03 LAB — MAGNESIUM: Magnesium: 2.5 mg/dL — ABNORMAL HIGH (ref 1.7–2.4)

## 2021-05-03 MED ORDER — METOPROLOL TARTRATE 50 MG PO TABS
75.0000 mg | ORAL_TABLET | Freq: Two times a day (BID) | ORAL | 0 refills | Status: DC
  Filled 2021-05-03: qty 90, 30d supply, fill #0

## 2021-05-03 MED ORDER — DAPAGLIFLOZIN PROPANEDIOL 10 MG PO TABS
10.0000 mg | ORAL_TABLET | Freq: Every morning | ORAL | 0 refills | Status: DC
  Filled 2021-05-03: qty 30, 30d supply, fill #0

## 2021-05-03 MED ORDER — HYDROCHLOROTHIAZIDE 25 MG PO TABS
50.0000 mg | ORAL_TABLET | Freq: Every day | ORAL | 0 refills | Status: DC
  Filled 2021-05-03: qty 60, 30d supply, fill #0

## 2021-05-03 MED ORDER — INSULIN PEN NEEDLE 32G X 4 MM MISC
0 refills | Status: DC
  Filled 2021-05-03: qty 100, 100d supply, fill #0

## 2021-05-03 MED ORDER — HYDRALAZINE HCL 50 MG PO TABS
75.0000 mg | ORAL_TABLET | Freq: Three times a day (TID) | ORAL | 0 refills | Status: DC
  Filled 2021-05-03: qty 135, 30d supply, fill #0

## 2021-05-03 MED ORDER — ATORVASTATIN CALCIUM 40 MG PO TABS
40.0000 mg | ORAL_TABLET | Freq: Every day | ORAL | 0 refills | Status: DC
  Filled 2021-05-03: qty 30, 30d supply, fill #0

## 2021-05-03 MED ORDER — LOSARTAN POTASSIUM 100 MG PO TABS
100.0000 mg | ORAL_TABLET | Freq: Every day | ORAL | 0 refills | Status: DC
  Filled 2021-05-03: qty 30, 30d supply, fill #0

## 2021-05-03 MED ORDER — POTASSIUM CHLORIDE CRYS ER 20 MEQ PO TBCR
40.0000 meq | EXTENDED_RELEASE_TABLET | ORAL | Status: AC
  Administered 2021-05-03 (×2): 40 meq via ORAL
  Filled 2021-05-03 (×2): qty 2

## 2021-05-03 MED ORDER — HYDRALAZINE HCL 50 MG PO TABS
75.0000 mg | ORAL_TABLET | Freq: Three times a day (TID) | ORAL | Status: DC
  Administered 2021-05-03: 75 mg via ORAL
  Filled 2021-05-03: qty 1

## 2021-05-03 MED ORDER — HYDRALAZINE HCL 50 MG PO TABS
75.0000 mg | ORAL_TABLET | Freq: Three times a day (TID) | ORAL | Status: DC
  Administered 2021-05-03 – 2021-05-07 (×11): 75 mg via ORAL
  Filled 2021-05-03 (×11): qty 1

## 2021-05-03 MED ORDER — SERTRALINE HCL 100 MG PO TABS
100.0000 mg | ORAL_TABLET | Freq: Every day | ORAL | 0 refills | Status: DC
  Filled 2021-05-03: qty 30, 30d supply, fill #0

## 2021-05-03 MED ORDER — INSULIN GLARGINE 100 UNIT/ML SOLOSTAR PEN
30.0000 [IU] | PEN_INJECTOR | Freq: Every morning | SUBCUTANEOUS | 0 refills | Status: DC
  Filled 2021-05-03: qty 15, 50d supply, fill #0

## 2021-05-03 NOTE — Progress Notes (Signed)
Eeg done 

## 2021-05-03 NOTE — Progress Notes (Signed)
PROGRESS NOTE    Kerry Webb  FGH:[id number] DOB: [birth year] DOA: 04/30/2021 PCP: Juluis Pitch, MD  225A/225A-AA   Assessment & Plan:   Active Problems:   Syncope and collapse   Kerry Webb is a 55 y.o. African-American female with medical history significant for Diabetes mellitus, hypertension, seizures and GERD, who presented to the emergency room with concern of syncope.  The patient was having a medical visit with her nephrologist and was noted to have an elevated blood pressure.  She was sitting and stood up while they were bringing her p.o. clonidine and did not feel well been lost her consciousness before taking clonidine.  She stated that she was trying to get up to go to the sink and get cool water as she was feeling funny.  She denied any chest pain or palpitations.  She admits to paresthesias in both thighs.  No nausea or vomiting or abdominal pain.  No fever or chills.  No cough or wheezing or dyspnea.  No bleeding diathesis.  No injuries were associated with her syncope as she was caught before falling.  Presents with increasing headache after this incidence in the ER that was helped with 2 p.o. Fioricet.  when she came to the ER blood pressure was 160/116 and later 199/118.    1.  Syncope. --carotid US, Echo, CT head remarkable.  Pt was not orthostatic.  Tele with NSR and no alarm events.  Pt reported distant hx of syncope and being worked up for seizure, however, current episodes were unlike her prior.   --s/p gentle hydration --continues to have multiple syncope events even with elevated BP. Plan: --Neuro consult today --EEG today  2.  Hypertensive urgency. - pt resumed on her Catapres 0.3 mg patch, Cozaar, Lopressor and p.o. hydralazine as well as HCTZ on admission, and then BP dropped.  Pt was not taking all of her BP meds PTA due to having lost her insurance. --clonidine patch d/c'ed due to risk of severe rebound hypertension Plan: --increase  hydralazine to 75 TID --cont HCTZ 50 mg daily --cont lopressor 75 mg BID --cont home losarrtan 100 mg daily  3.  Dyslipidemia. --cont statin  4.  Type II diabetes mellitus without complications on long-term insulin, poorly controlled --A1c 9.1 --cont glargine 30u daily --SSI TID - continue Farxiga.  5.  Depression. --cont Zoloft  Hypokalemia --monitor and replete PRN   DVT prophylaxis: Lovenox SQ Code Status: Full code  Family Communication:  Level of care: Med-Surg Dispo:   The patient is from: home Anticipated d/c is to: home Anticipated d/c date is: 1-2 days Patient currently is not medically ready to d/c due to: continue to have syncopal episodes, unclear etiology, pending neuro consult   Subjective and Interval History:  RN documented another episode where pt's legs gave out while washing her hands in the sink.    Neuro consulted today, and ordered EEG.   Objective: Vitals:   05/03/21 0500 05/03/21 0526 05/03/21 0537 05/03/21 0734  BP:  (!) 163/94 (!) 165/96 (!) 159/89  Pulse:  (!) 56 (!) 55 (!) 55  Resp:  16 18 18   Temp:  98.1 F (36.7 C)  97.9 F (36.6 C)  TempSrc:  Oral  Oral  SpO2:  100% 100% 100%  Weight: 81.4 kg     Height:        Intake/Output Summary (Last 24 hours) at 05/03/2021 1443 Last data filed at 05/02/2021 1920 Gross per 24 hour  Intake 240 ml  Output --  Net 240 ml   Filed Weights   04/29/21 1706 05/02/21 0500 05/03/21 0500  Weight: 85.7 kg 85.2 kg 81.4 kg    Examination:   Constitutional: NAD, AAOx3 HEENT: conjunctivae and lids normal, EOMI CV: No cyanosis.   RESP: normal respiratory effort, on RA Extremities: No effusions, edema in BLE SKIN: warm, dry Neuro: II - XII grossly intact.   Psych: Normal mood and affect.  Appropriate judgement and reason   Data Reviewed: I have personally reviewed following labs and imaging studies  CBC: Recent Labs  Lab 04/29/21 1705 04/30/21 0740 05/01/21 0627 05/02/21 0621  05/03/21 0641  WBC 7.9 9.1 8.4 8.1 7.1  HGB 15.8* 16.6* 14.3 14.5 17.4*  HCT 46.6* 49.0* 43.1 42.1 50.2*  MCV 88.4 89.4 90.2 89.4 89.0  PLT 193 149* 266 272 637   Basic Metabolic Panel: Recent Labs  Lab 04/29/21 1705 04/30/21 0022 04/30/21 0740 05/01/21 0627 05/02/21 0621 05/03/21 0641  NA 139  --   --  141 139 139  K 3.5  --   --  3.5 3.2* 2.9*  CL 102  --   --  106 107 104  CO2 28  --   --  30 26 27   GLUCOSE 219*  --   --  146* 168* 156*  BUN 10  --   --  11 11 13   CREATININE 0.71  --  0.69 0.81 0.79 0.84  CALCIUM 9.6  --   --  8.8* 8.3* 9.0  MG  --  2.0  --   --  2.3 2.5*   GFR: Estimated Creatinine Clearance: 79.8 mL/min (by C-G formula based on SCr of 0.84 mg/dL). Liver Function Tests: No results for input(s): AST, ALT, ALKPHOS, BILITOT, PROT, ALBUMIN in the last 168 hours. No results for input(s): LIPASE, AMYLASE in the last 168 hours. No results for input(s): AMMONIA in the last 168 hours. Coagulation Profile: No results for input(s): INR, PROTIME in the last 168 hours. Cardiac Enzymes: No results for input(s): CKTOTAL, CKMB, CKMBINDEX, TROPONINI in the last 168 hours. BNP (last 3 results) No results for input(s): PROBNP in the last 8760 hours. HbA1C: No results for input(s): HGBA1C in the last 72 hours.  CBG: Recent Labs  Lab 05/02/21 1701 05/02/21 2249 05/03/21 0520 05/03/21 0736 05/03/21 1135  GLUCAP 100* 178* 152* 178* 145*   Lipid Profile: No results for input(s): CHOL, HDL, LDLCALC, TRIG, CHOLHDL, LDLDIRECT in the last 72 hours. Thyroid Function Tests: No results for input(s): TSH, T4TOTAL, FREET4, T3FREE, THYROIDAB in the last 72 hours. Anemia Panel: No results for input(s): VITAMINB12, FOLATE, FERRITIN, TIBC, IRON, RETICCTPCT in the last 72 hours. Sepsis Labs: No results for input(s): PROCALCITON, LATICACIDVEN in the last 168 hours.  Recent Results (from the past 240 hour(s))  Resp Panel by RT-PCR (Flu A&B, Covid) Nasopharyngeal Swab      Status: None   Collection Time: 04/30/21  3:11 AM   Specimen: Nasopharyngeal Swab; Nasopharyngeal(NP) swabs in vial transport medium  Result Value Ref Range Status   SARS Coronavirus 2 by RT PCR NEGATIVE NEGATIVE Final    Comment: (NOTE) SARS-CoV-2 target nucleic acids are NOT DETECTED.  The SARS-CoV-2 RNA is generally detectable in upper respiratory specimens during the acute phase of infection. The lowest concentration of SARS-CoV-2 viral copies this assay can detect is 138 copies/mL. A negative result does not preclude SARS-Cov-2 infection and should not be used as the sole basis for treatment or other patient management decisions. A  negative result may occur with  improper specimen collection/handling, submission of specimen other than nasopharyngeal swab, presence of viral mutation(s) within the areas targeted by this assay, and inadequate number of viral copies(<138 copies/mL). A negative result must be combined with clinical observations, patient history, and epidemiological information. The expected result is Negative.  Fact Sheet for Patients:  EntrepreneurPulse.com.au  Fact Sheet for Healthcare Providers:  IncredibleEmployment.be  This test is no t yet approved or cleared by the [place] FDA and  has been authorized for detection and/or diagnosis of SARS-CoV-2 by FDA under an Emergency Use Authorization (EUA). This EUA will remain  in effect (meaning this test can be used) for the duration of the COVID-19 declaration under Section 564(b)(1) of the Act, 21 U.S.C.section 360bbb-3(b)(1), unless the authorization is terminated  or revoked sooner.       Influenza A by PCR NEGATIVE NEGATIVE Final   Influenza B by PCR NEGATIVE NEGATIVE Final    Comment: (NOTE) The Xpert Xpress SARS-CoV-2/FLU/RSV plus assay is intended as an aid in the diagnosis of influenza from Nasopharyngeal swab specimens and should not be used as a sole basis  for treatment. Nasal washings and aspirates are unacceptable for Xpert Xpress SARS-CoV-2/FLU/RSV testing.  Fact Sheet for Patients: EntrepreneurPulse.com.au  Fact Sheet for Healthcare Providers: IncredibleEmployment.be  This test is not yet approved or cleared by the [place] FDA and has been authorized for detection and/or diagnosis of SARS-CoV-2 by FDA under an Emergency Use Authorization (EUA). This EUA will remain in effect (meaning this test can be used) for the duration of the COVID-19 declaration under Section 564(b)(1) of the Act, 21 U.S.C. section 360bbb-3(b)(1), unless the authorization is terminated or revoked.  Performed at [facility], [address], [place], [place] [address]       Radiology Studies: No results found.   Scheduled Meds:  atorvastatin  40 mg Oral Daily   dapagliflozin propanediol  10 mg Oral q morning   enoxaparin (LOVENOX) injection  0.5 mg/kg Subcutaneous Q24H   hydrALAZINE  75 mg Oral TID   hydrochlorothiazide  50 mg Oral Daily   insulin aspart  0-15 Units Subcutaneous TID AC & HS   insulin glargine-yfgn  30 Units Subcutaneous q AM   losartan  100 mg Oral Daily   metoprolol tartrate  75 mg Oral BID   potassium chloride SA  20 mEq Oral Daily   sertraline  100 mg Oral Daily   sodium chloride flush  3 mL Intravenous Q12H   Continuous Infusions:   LOS: 2 days     Enzo Bi, MD [facility] If 7PM-7AM, please contact night-coverage 05/03/2021, 2:43 PM

## 2021-05-03 NOTE — Progress Notes (Signed)
Patient's legs gave out while washing her hands on sink. Nurse was right behind her and patient fell on bed. Had just checked her VS and were normal BP 163/94, HR: 56. rechecked after episode and was 165/96 HR: 55. States she did not lose consciousness. NP Randol Kern notified.

## 2021-05-03 NOTE — TOC Progression Note (Addendum)
Transition of Care [facility]) - Progression Note    Patient Details  Name: Kerry Webb MRN: [id number] Date of Birth: Mar 25, 1966  Transition of Care [facility]) CM/SW Contact  Beverly Sessions, RN Phone Number: 05/03/2021, 3:31 PM  Clinical Narrative:     Patient admitted from home with syncope Patient states that he live at home Patient has 2 children however the live out of town  Patient is currently un employed and is having difficulty obtaining her medication  PCP Carlean [facility]  MD has sent medications below to Medication Management  will be delivered to floor today and stored in locked medication room till discharge.   If patient is order additional medications will need medication assistance  Lipitor Apresoline Hyrodiuril Cozar Lopressor Zolft Agnes Lawrence Lantus   Expected Discharge Plan: Home/Self Care Barriers to Discharge: Continued Medical Work up  Expected Discharge Plan and Services Expected Discharge Plan: Home/Self Care         Expected Discharge Date: 05/01/21                                     Social Determinants of Health (SDOH) Interventions    Readmission Risk Interventions No flowsheet data found.

## 2021-05-04 ENCOUNTER — Inpatient Hospital Stay: Payer: Self-pay

## 2021-05-04 LAB — CBC
HCT: 48.3 % — ABNORMAL HIGH (ref 36.0–46.0)
Hemoglobin: 16.2 g/dL — ABNORMAL HIGH (ref 12.0–15.0)
MCH: 29.6 pg (ref 26.0–34.0)
MCHC: 33.5 g/dL (ref 30.0–36.0)
MCV: 88.3 fL (ref 80.0–100.0)
Platelets: 320 10*3/uL (ref 150–400)
RBC: 5.47 MIL/uL — ABNORMAL HIGH (ref 3.87–5.11)
RDW: 13 % (ref 11.5–15.5)
WBC: 8.6 10*3/uL (ref 4.0–10.5)
nRBC: 0 % (ref 0.0–0.2)

## 2021-05-04 LAB — GLUCOSE, CAPILLARY
Glucose-Capillary: 173 mg/dL — ABNORMAL HIGH (ref 70–99)
Glucose-Capillary: 134 mg/dL — ABNORMAL HIGH (ref 70–99)
Glucose-Capillary: 106 mg/dL — ABNORMAL HIGH (ref 70–99)
Glucose-Capillary: 166 mg/dL — ABNORMAL HIGH (ref 70–99)
Glucose-Capillary: 169 mg/dL — ABNORMAL HIGH (ref 70–99)

## 2021-05-04 LAB — BASIC METABOLIC PANEL
Anion gap: 9 (ref 5–15)
BUN: 16 mg/dL (ref 6–20)
CO2: 26 mmol/L (ref 22–32)
Calcium: 9.1 mg/dL (ref 8.9–10.3)
Chloride: 104 mmol/L (ref 98–111)
Creatinine, Ser: 0.89 mg/dL (ref 0.44–1.00)
GFR, Estimated: >60 mL/min (ref >60)
Glucose, Bld: 163 mg/dL — ABNORMAL HIGH (ref 70–99)
Potassium: 3.1 mmol/L — ABNORMAL LOW (ref 3.5–5.1)
Sodium: 139 mmol/L (ref 135–145)

## 2021-05-04 LAB — MAGNESIUM: Magnesium: 2.4 mg/dL (ref 1.7–2.4)

## 2021-05-04 MED ORDER — LEVETIRACETAM 500 MG PO TABS: 500.0000 mg | ORAL_TABLET | Freq: Two times a day (BID) | ORAL | Status: DC

## 2021-05-04 MED ORDER — SODIUM CHLORIDE 0.9 % IV SOLN
20.0000 mg/kg | Freq: Once | INTRAVENOUS | Status: AC
  Administered 2021-05-04: 1630 mg via INTRAVENOUS
  Filled 2021-05-04: qty 16.3

## 2021-05-04 MED ORDER — LEVETIRACETAM 500 MG PO TABS
500.0000 mg | ORAL_TABLET | Freq: Two times a day (BID) | ORAL | Status: DC
  Administered 2021-05-04 – 2021-05-07 (×6): 500 mg via ORAL
  Filled 2021-05-04 (×6): qty 1

## 2021-05-04 MED ORDER — POTASSIUM CHLORIDE CRYS ER 20 MEQ PO TBCR
40.0000 meq | EXTENDED_RELEASE_TABLET | Freq: Every day | ORAL | Status: DC
  Administered 2021-05-04 – 2021-05-07 (×4): 40 meq via ORAL
  Filled 2021-05-04 (×3): qty 2

## 2021-05-04 MED ORDER — INSULIN ASPART 100 UNIT/ML IJ SOLN
0.0000 [IU] | Freq: Three times a day (TID) | INTRAMUSCULAR | Status: DC
  Administered 2021-05-04 – 2021-05-05 (×3): 3 [IU] via SUBCUTANEOUS
  Administered 2021-05-06: 2 [IU] via SUBCUTANEOUS
  Administered 2021-05-06: 3 [IU] via SUBCUTANEOUS
  Administered 2021-05-06 – 2021-05-07 (×3): 2 [IU] via SUBCUTANEOUS
  Filled 2021-05-04 (×9): qty 1

## 2021-05-04 MED ORDER — POTASSIUM CHLORIDE CRYS ER 20 MEQ PO TBCR: 40.0000 meq | EXTENDED_RELEASE_TABLET | Freq: Every day | ORAL | Status: DC

## 2021-05-04 MED ORDER — GADOBUTROL 1 MMOL/ML IV SOLN
7.5000 mL | Freq: Once | INTRAVENOUS | Status: AC | PRN
  Administered 2021-05-04: 7.5 mL via INTRAVENOUS

## 2021-05-04 NOTE — Progress Notes (Signed)
  Chaplain On-Call responded to Rapid Response notification at 1321 hours.  The patient was being stabilized by medical team and was unavailable for a visit.  Chaplains are available as needed for additional support.  Chaplain Pollyann Samples M.Div., [facility]
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
## 2021-05-04 NOTE — Progress Notes (Signed)
Neurology Progress Report  S: rEEG overnight was normal. Given frequency of events patient was loaded with keppra 20 mg/kg this AM and started on 500mg  bid maint.   Patient has had another typical event this afternoon where she lost consciousness while ambulating to BR and had assisted fall w/ RN (no injuries or head trauma). Per Dr. Billie Ruddy, afterwards patient did not recall event, appeared daze, could hear providers talking but had trouble finding correct words to respond. When I evaluated her an hour later she was asleep but arousable, and was back to neurologic baseline.  Interval data:  rEEG: normal MRI brain wwo: normal  Vitals:   05/04/21 0744 05/04/21 1602  BP: (!) 154/93 139/84  Pulse: 61 71  Resp: 18 18  Temp: 97.6 F (36.4 C) 98.9 F (37.2 C)  SpO2: 99% 100%     Physical Exam Gen: A&Ox4, NAD HEENT: Atraumatic, normocephalic; oropharynx clear, tongue without atrophy or fasciculations. Resp: CTAB, normal work of breathing CV: RRR, extremities appear well-perfused. Abd: soft/NT/ND Extrem: Nml bulk; no cyanosis, clubbing, or edema.  Neuro: *MS: A&O x4. Follows multi-step commands.  *Speech: no dysarthria or aphasia, able to name and repeat. *CN:    I: Deferred   II,III: PERRLA, VFF by confrontation, optic discs not visualized 2/2 pupillary constriction   III,IV,VI: EOMI w/o nystagmus, no ptosis   V: Sensation intact from V1 to V3 to LT   VII: Eyelid closure was full.  Smile symmetric.   VIII: Hearing intact to voice   IX,X: Voice normal, palate elevates symmetrically    XI: SCM/trap 5/5 bilat   XII: Tongue protrudes midline, no atrophy or fasciculations  *Motor:   Normal bulk.  No tremor, rigidity or bradykinesia. No pronator drift.   Strength: Dlt Bic Tri WE WrF FgS Gr HF KnF KnE PlF DoF    Left 5 5 5 5 5 5 5 5 5 5 5 5     Right 5 5 5 5 5 5 5 5 5 5 5 5    *Sensory: Intact to light touch, pinprick, temperature vibration throughout. Symmetric. Propioception intact  bilat.  No double-simultaneous extinction.  *Coordination:  Finger-to-nose, heel-to-shin, rapid alternating motions were intact. *Reflexes:  2+ and symmetric throughout without clonus; toes down-going bilat *Gait: deferred  A/p: This is a 55 year old woman with a past medical history significant for seizures as a teenager, diabetes, hypertension who is admitted with recurrent syncope/spells (6+ events since admission).  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 MRI brain wwo contrast and rEEG both normal. Cardiac workup for current episodes has been negative thus far and she is not orthostatic during the events.   Certain factors increase likelihood these could be epileptic in etiology: she does have hx similar events as a teenager that occurred after TBI and resolved with dilantin that was eventually able to be weaned in adulthood. She did seem to be confused after the event today, potentially post-ictal.  PNES is also on the differential. She has a significant trauma history, and it is unusual that even in a patient with remote hx seizures decades ago that seizures recur suddenly at high frequency without identifiable trigger or abnl MRI or EEG findings.  She was loaded with keppra this morning and started on 500mg  bid; she had another event this afternoon several hours after the load. Given continued high frequency of events recommend transfer to [facility] for cEEG for spell characterization to clarify  if these are epileptic.  Recommendations  - Transfer to [facility] for cEEG for spell characterization - Continue keppra 500mg  bid for now; this may be weaned at [facility] if no spells captured during the first 1-2 days recording. Events are occurring 1-2x/day so I doubt this will be an issue. - Seizure precautions    I will continue to follow until she is transferred, at which point [facility] neurohospitalist team will follow.  Su Monks,  MD [facility] [phone]  If 7pm- 7am, please page neurology on call as listed in [place].

## 2021-05-04 NOTE — Progress Notes (Signed)
Mobility Specialist - Progress Note   05/04/21 1200  Mobility  Activity Off unit  Mobility performed by Mobility specialist    Pt off unit at this time for MRI. Will attempt session another date/time.    Kathee Delton Mobility Specialist 05/04/21, 12:22 PM

## 2021-05-04 NOTE — Procedures (Signed)
Routine EEG Report  Kerry Webb is a 55 y.o. female with a history of seizures who is undergoing an EEG to evaluate for seizures.  Report: This EEG was acquired with electrodes placed according to the International 10-20 electrode system (including Fp1, Fp2, F3, F4, C3, C4, P3, P4, O1, O2, T3, T4, T5, T6, A1, A2, Fz, Cz, Pz). The following electrodes were missing or displaced: none.  The occipital dominant rhythm was 9 Hz. This activity is reactive to stimulation. Drowsiness was manifested by background fragmentation; deeper stages of sleep were identified by K complexes and sleep spindles. There was no focal slowing. There were no interictal epileptiform discharges. There were no electrographic seizures identified. There was no abnormal response to photic stimulation or hyperventilation.   Impression: This EEG was obtained while awake and asleep and is normal.    Clinical Correlation: Normal EEGs, however, do not rule out epilepsy.  Su Monks, MD [facility] [phone]  If 7pm- 7am, please page neurology on call as listed in [place].
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
## 2021-05-04 NOTE — Progress Notes (Signed)
Patient returned from MRI. Upon entering room to get blood sugar, patient stated she needed to use the restroom. While assisting patient to the bathroom, patient lost consciousness. RN was able to catch patient and stabilize to lower to the floor while calling for help. Unit staff, rapid RN and MD to bedside. Staff assisted patient back into bed. Vital signs and blood glucose obtained. MD made RN aware of pending transfer to [facility] for further EEG testing.

## 2021-05-05 LAB — BASIC METABOLIC PANEL
Anion gap: 8 (ref 5–15)
BUN: 16 mg/dL (ref 6–20)
CO2: 27 mmol/L (ref 22–32)
Calcium: 9.2 mg/dL (ref 8.9–10.3)
Chloride: 104 mmol/L (ref 98–111)
Creatinine, Ser: 0.88 mg/dL (ref 0.44–1.00)
GFR, Estimated: >60 mL/min (ref >60)
Glucose, Bld: 172 mg/dL — ABNORMAL HIGH (ref 70–99)
Potassium: 3.2 mmol/L — ABNORMAL LOW (ref 3.5–5.1)
Sodium: 139 mmol/L (ref 135–145)

## 2021-05-05 LAB — CBC
HCT: 48.9 % — ABNORMAL HIGH (ref 36.0–46.0)
Hemoglobin: 16.4 g/dL — ABNORMAL HIGH (ref 12.0–15.0)
MCH: 29.5 pg (ref 26.0–34.0)
MCHC: 33.5 g/dL (ref 30.0–36.0)
MCV: 88.1 fL (ref 80.0–100.0)
Platelets: 302 10*3/uL (ref 150–400)
RBC: 5.55 MIL/uL — ABNORMAL HIGH (ref 3.87–5.11)
RDW: 13 % (ref 11.5–15.5)
WBC: 8.1 10*3/uL (ref 4.0–10.5)
nRBC: 0 % (ref 0.0–0.2)

## 2021-05-05 LAB — GLUCOSE, CAPILLARY
Glucose-Capillary: 183 mg/dL — ABNORMAL HIGH (ref 70–99)
Glucose-Capillary: 164 mg/dL — ABNORMAL HIGH (ref 70–99)
Glucose-Capillary: 120 mg/dL — ABNORMAL HIGH (ref 70–99)
Glucose-Capillary: 212 mg/dL — ABNORMAL HIGH (ref 70–99)

## 2021-05-05 LAB — MAGNESIUM: Magnesium: 2.1 mg/dL (ref 1.7–2.4)

## 2021-05-05 NOTE — Progress Notes (Signed)
Pt was seen today.  No new complaints.  BP has been well controlled on current regimen.  No more reported syncope events.  Still waiting on transfer to [facility].    No charge note.

## 2021-05-05 NOTE — Progress Notes (Signed)
Neurology Progress Report  Patient ID: 55 year old woman with a past medical history significant for seizures as a teenager, diabetes, hypertension who is admitted with recurrent syncope/spells (6+ events since admission). She is currently awaiting transfer to [facility] for cEEG for spell characterization.  S: No further spells since yesterday afternoon. No new complaints today.   Vitals:   05/05/21 0747 05/05/21 1601  BP: 115/71 133/88  Pulse: 66 71  Resp: 18 18  Temp: 98.4 F (36.9 C) 98.3 F (36.8 C)  SpO2: 100% 99%    Physical Exam Gen: A&Ox4, NAD HEENT: Atraumatic, normocephalic; oropharynx clear, tongue without atrophy or fasciculations. Resp: CTAB, normal work of breathing CV: RRR, extremities appear well-perfused. Abd: soft/NT/ND Extrem: Nml bulk; no cyanosis, clubbing, or edema.  Neuro: *MS: A&O x4. Follows multi-step commands.  *Speech: no dysarthria or aphasia, able to name and repeat. *CN:    I: Deferred   II,III: PERRLA, VFF by confrontation, optic discs not visualized 2/2 pupillary constriction   III,IV,VI: EOMI w/o nystagmus, no ptosis   V: Sensation intact from V1 to V3 to LT   VII: Eyelid closure was full.  Smile symmetric.   VIII: Hearing intact to voice   IX,X: Voice normal, palate elevates symmetrically    XI: SCM/trap 5/5 bilat   XII: Tongue protrudes midline, no atrophy or fasciculations  *Motor:   Normal bulk.  No tremor, rigidity or bradykinesia. No pronator drift.   Strength: Dlt Bic Tri WE WrF FgS Gr HF KnF KnE PlF DoF    Left 5 5 5 5 5 5 5 5 5 5 5 5     Right 5 5 5 5 5 5 5 5 5 5 5 5    *Sensory: Intact to light touch, pinprick, temperature vibration throughout. Symmetric. Propioception intact bilat.  No double-simultaneous extinction.  *Coordination:  Finger-to-nose, heel-to-shin, rapid alternating motions were intact. *Reflexes:  2+ and symmetric throughout without clonus; toes down-going bilat *Gait: deferred  Data:  rEEG: normal, no spells  captured MRI brain wwo: normal  A/p: This is a 55 year old woman with a past medical history significant for seizures as a teenager, diabetes, hypertension who is admitted with recurrent syncope/spells (6+ events since admission).  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 MRI brain wwo contrast and rEEG both normal. Cardiac workup for current episodes has been negative thus far and she is not orthostatic during the events.   Certain factors increase likelihood these could be epileptic in etiology: she does have hx similar events as a teenager that occurred after TBI and resolved with dilantin that was eventually able to be weaned in adulthood. She did seem to be confused after the event today, potentially post-ictal.  PNES is also on the differential. She has a significant trauma history, and it is unusual that even in a patient with remote hx seizures decades ago that seizures recur suddenly at high frequency without identifiable trigger or abnl MRI or EEG findings.  She was loaded with keppra yesterday AM and started on 500mg  bid; she had another event yesterday afternoon several hours after the load. Given continued high frequency of events recommended transfer to [facility] for cEEG for spell characterization to clarify if these are epileptic.  Recommendations  - Transfer pending to [facility] neurohospitalist service for cEEG for spell characterization - Continue keppra 500mg  bid for now; this may be weaned at [facility] if no spells captured during the first 1-2 days recording.  Events are occurring 1-2x/day so I doubt this will be an issue. - Seizure precautions    I will continue to follow until she is transferred, at which point [facility] neurohospitalist team will follow.  Su Monks, MD [facility] [phone]  If 7pm- 7am, please page neurology on call as listed in [place].

## 2021-05-06 DIAGNOSIS — R569 Unspecified convulsions: Secondary | ICD-10-CM

## 2021-05-06 LAB — CBC
HCT: 48.7 % — ABNORMAL HIGH (ref 36.0–46.0)
Hemoglobin: 16 g/dL — ABNORMAL HIGH (ref 12.0–15.0)
MCH: 29.5 pg (ref 26.0–34.0)
MCHC: 32.9 g/dL (ref 30.0–36.0)
MCV: 89.9 fL (ref 80.0–100.0)
Platelets: 288 10*3/uL (ref 150–400)
RBC: 5.42 MIL/uL — ABNORMAL HIGH (ref 3.87–5.11)
RDW: 13 % (ref 11.5–15.5)
WBC: 7.9 10*3/uL (ref 4.0–10.5)
nRBC: 0 % (ref 0.0–0.2)

## 2021-05-06 LAB — BASIC METABOLIC PANEL
Anion gap: 6 (ref 5–15)
BUN: 16 mg/dL (ref 6–20)
CO2: 29 mmol/L (ref 22–32)
Calcium: 8.8 mg/dL — ABNORMAL LOW (ref 8.9–10.3)
Chloride: 107 mmol/L (ref 98–111)
Creatinine, Ser: 0.77 mg/dL (ref 0.44–1.00)
GFR, Estimated: >60 mL/min (ref >60)
Glucose, Bld: 181 mg/dL — ABNORMAL HIGH (ref 70–99)
Potassium: 3.5 mmol/L (ref 3.5–5.1)
Sodium: 142 mmol/L (ref 135–145)

## 2021-05-06 LAB — GLUCOSE, CAPILLARY
Glucose-Capillary: 129 mg/dL — ABNORMAL HIGH (ref 70–99)
Glucose-Capillary: 136 mg/dL — ABNORMAL HIGH (ref 70–99)
Glucose-Capillary: 190 mg/dL — ABNORMAL HIGH (ref 70–99)
Glucose-Capillary: 163 mg/dL — ABNORMAL HIGH (ref 70–99)

## 2021-05-06 LAB — MAGNESIUM: Magnesium: 2.3 mg/dL (ref 1.7–2.4)

## 2021-05-06 NOTE — Plan of Care (Signed)

## 2021-05-06 NOTE — Progress Notes (Addendum)
Pt seen today at bedside.  Pt reported no more syncope events since rapid response on 10/29.  RN confirmed no further witnessed events.    BP has been well controlled on current regimen.  Discussed with pt possibility of going home instead of waiting on transfer to [facility], since syncope events seem to have stopped after starting Keppra.  Pt exhibited some apprehension about the idea of going home.  Later, during nursing director rounds, pt told director that she found herself on the ground the night before, but didn't alert anyone about it.    Given the continued question about the nature of pt's spells, neuro and I agree to continue plan to transfer pt to [facility] for continuous EEG.  No charge note.  Pt discharged already on 10/29.
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
## 2021-05-07 ENCOUNTER — Inpatient Hospital Stay (HOSPITAL_COMMUNITY): Payer: Self-pay

## 2021-05-07 ENCOUNTER — Inpatient Hospital Stay (HOSPITAL_COMMUNITY)
Admission: AD | Admit: 2021-05-07 | Discharge: 2021-05-10 | DRG: 101 | Disposition: A | Discharge status: Home / Self Care | Payer: Self-pay | Source: Other Acute Inpatient Hospital | Attending: Internal Medicine | Admitting: Internal Medicine

## 2021-05-07 DIAGNOSIS — Z87828 Personal history of other (healed) physical injury and trauma: Secondary | ICD-10-CM

## 2021-05-07 DIAGNOSIS — F32A Depression, unspecified: Secondary | ICD-10-CM | POA: Diagnosis present

## 2021-05-07 DIAGNOSIS — Z87898 Personal history of other specified conditions: Secondary | ICD-10-CM

## 2021-05-07 DIAGNOSIS — Z794 Long term (current) use of insulin: Secondary | ICD-10-CM

## 2021-05-07 DIAGNOSIS — R569 Unspecified convulsions: Principal | ICD-10-CM | POA: Diagnosis present

## 2021-05-07 DIAGNOSIS — E785 Hyperlipidemia, unspecified: Secondary | ICD-10-CM | POA: Diagnosis present

## 2021-05-07 DIAGNOSIS — E876 Hypokalemia: Secondary | ICD-10-CM | POA: Diagnosis present

## 2021-05-07 DIAGNOSIS — Z91013 Allergy to seafood: Secondary | ICD-10-CM

## 2021-05-07 DIAGNOSIS — K810 Acute cholecystitis: Secondary | ICD-10-CM

## 2021-05-07 DIAGNOSIS — Z800 Family history of malignant neoplasm of digestive organs: Secondary | ICD-10-CM

## 2021-05-07 DIAGNOSIS — Z803 Family history of malignant neoplasm of breast: Secondary | ICD-10-CM

## 2021-05-07 DIAGNOSIS — K219 Gastro-esophageal reflux disease without esophagitis: Secondary | ICD-10-CM | POA: Diagnosis present

## 2021-05-07 DIAGNOSIS — E1169 Type 2 diabetes mellitus with other specified complication: Secondary | ICD-10-CM

## 2021-05-07 DIAGNOSIS — Z87820 Personal history of traumatic brain injury: Secondary | ICD-10-CM

## 2021-05-07 DIAGNOSIS — R55 Syncope and collapse: Secondary | ICD-10-CM | POA: Diagnosis present

## 2021-05-07 DIAGNOSIS — R4689 Other symptoms and signs involving appearance and behavior: Secondary | ICD-10-CM

## 2021-05-07 DIAGNOSIS — Z886 Allergy status to analgesic agent status: Secondary | ICD-10-CM

## 2021-05-07 DIAGNOSIS — Z833 Family history of diabetes mellitus: Secondary | ICD-10-CM

## 2021-05-07 DIAGNOSIS — E119 Type 2 diabetes mellitus without complications: Secondary | ICD-10-CM | POA: Diagnosis present

## 2021-05-07 DIAGNOSIS — I10 Essential (primary) hypertension: Secondary | ICD-10-CM | POA: Diagnosis present

## 2021-05-07 DIAGNOSIS — Z79899 Other long term (current) drug therapy: Secondary | ICD-10-CM

## 2021-05-07 DIAGNOSIS — Z91018 Allergy to other foods: Secondary | ICD-10-CM

## 2021-05-07 DIAGNOSIS — Z62810 Personal history of physical and sexual abuse in childhood: Secondary | ICD-10-CM | POA: Diagnosis present

## 2021-05-07 LAB — MAGNESIUM: Magnesium: 2.1 mg/dL (ref 1.7–2.4)

## 2021-05-07 LAB — BASIC METABOLIC PANEL
Anion gap: 10 (ref 5–15)
BUN: 13 mg/dL (ref 6–20)
CO2: 23 mmol/L (ref 22–32)
Calcium: 9.1 mg/dL (ref 8.9–10.3)
Chloride: 103 mmol/L (ref 98–111)
Creatinine, Ser: 0.74 mg/dL (ref 0.44–1.00)
GFR, Estimated: >60 mL/min (ref >60)
Glucose, Bld: 137 mg/dL — ABNORMAL HIGH (ref 70–99)
Potassium: 3.1 mmol/L — ABNORMAL LOW (ref 3.5–5.1)
Sodium: 136 mmol/L (ref 135–145)

## 2021-05-07 LAB — CBC
HCT: 47.5 % — ABNORMAL HIGH (ref 36.0–46.0)
Hemoglobin: 15.6 g/dL — ABNORMAL HIGH (ref 12.0–15.0)
MCH: 29.6 pg (ref 26.0–34.0)
MCHC: 32.8 g/dL (ref 30.0–36.0)
MCV: 90.1 fL (ref 80.0–100.0)
Platelets: 325 10*3/uL (ref 150–400)
RBC: 5.27 MIL/uL — ABNORMAL HIGH (ref 3.87–5.11)
RDW: 12.9 % (ref 11.5–15.5)
WBC: 9.8 10*3/uL (ref 4.0–10.5)
nRBC: 0 % (ref 0.0–0.2)

## 2021-05-07 LAB — GLUCOSE, CAPILLARY
Glucose-Capillary: 128 mg/dL — ABNORMAL HIGH (ref 70–99)
Glucose-Capillary: 121 mg/dL — ABNORMAL HIGH (ref 70–99)
Glucose-Capillary: 163 mg/dL — ABNORMAL HIGH (ref 70–99)
Glucose-Capillary: 213 mg/dL — ABNORMAL HIGH (ref 70–99)

## 2021-05-07 MED ORDER — INSULIN GLARGINE 100 UNIT/ML SOLOSTAR PEN: 30.0000 [IU] | PEN_INJECTOR | Freq: Every morning | SUBCUTANEOUS | Status: DC

## 2021-05-07 MED ORDER — INSULIN GLARGINE-YFGN 100 UNIT/ML ~~LOC~~ SOLN
30.0000 [IU] | Freq: Every morning | SUBCUTANEOUS | Status: DC
  Administered 2021-05-08 – 2021-05-10 (×3): 30 [IU] via SUBCUTANEOUS
  Filled 2021-05-07 (×5): qty 0.3

## 2021-05-07 MED ORDER — TRAZODONE HCL 50 MG PO TABS
50.0000 mg | ORAL_TABLET | Freq: Every evening | ORAL | Status: DC | PRN
  Administered 2021-05-08 – 2021-05-09 (×3): 50 mg via ORAL
  Filled 2021-05-07 (×3): qty 1

## 2021-05-07 MED ORDER — HYDRALAZINE HCL 50 MG PO TABS
75.0000 mg | ORAL_TABLET | Freq: Three times a day (TID) | ORAL | Status: DC
  Administered 2021-05-07 – 2021-05-10 (×9): 75 mg via ORAL
  Filled 2021-05-07 (×9): qty 1

## 2021-05-07 MED ORDER — ACETAMINOPHEN 650 MG RE SUPP: 650.0000 mg | Freq: Four times a day (QID) | RECTAL | Status: DC | PRN

## 2021-05-07 MED ORDER — HEPARIN SODIUM (PORCINE) 5000 UNIT/ML IJ SOLN
5000.0000 [IU] | Freq: Three times a day (TID) | INTRAMUSCULAR | Status: DC
  Administered 2021-05-07 – 2021-05-10 (×9): 5000 [IU] via SUBCUTANEOUS
  Filled 2021-05-07 (×9): qty 1

## 2021-05-07 MED ORDER — OXYCODONE HCL 5 MG PO TABS
5.0000 mg | ORAL_TABLET | ORAL | Status: DC | PRN
  Administered 2021-05-09 – 2021-05-10 (×2): 5 mg via ORAL
  Filled 2021-05-07 (×2): qty 1

## 2021-05-07 MED ORDER — ATORVASTATIN CALCIUM 40 MG PO TABS
40.0000 mg | ORAL_TABLET | Freq: Every day | ORAL | Status: DC
  Administered 2021-05-07 – 2021-05-10 (×4): 40 mg via ORAL
  Filled 2021-05-07 (×4): qty 1

## 2021-05-07 MED ORDER — METOPROLOL TARTRATE 50 MG PO TABS
75.0000 mg | ORAL_TABLET | Freq: Two times a day (BID) | ORAL | Status: DC
  Administered 2021-05-07 – 2021-05-10 (×6): 75 mg via ORAL
  Filled 2021-05-07 (×6): qty 1

## 2021-05-07 MED ORDER — METOPROLOL TARTRATE 5 MG/5ML IV SOLN: 5.0000 mg | INTRAVENOUS | Status: DC | PRN

## 2021-05-07 MED ORDER — INSULIN ASPART 100 UNIT/ML IJ SOLN
0.0000 [IU] | Freq: Every day | INTRAMUSCULAR | Status: DC
  Administered 2021-05-07 – 2021-05-08 (×2): 2 [IU] via SUBCUTANEOUS
  Administered 2021-05-08: 1 [IU] via SUBCUTANEOUS

## 2021-05-07 MED ORDER — INSULIN ASPART 100 UNIT/ML IJ SOLN
0.0000 [IU] | Freq: Three times a day (TID) | INTRAMUSCULAR | Status: DC
  Administered 2021-05-08: 3 [IU] via SUBCUTANEOUS
  Administered 2021-05-08: 1 [IU] via SUBCUTANEOUS
  Administered 2021-05-08: 5 [IU] via SUBCUTANEOUS
  Administered 2021-05-09: 3 [IU] via SUBCUTANEOUS
  Administered 2021-05-09 (×2): 2 [IU] via SUBCUTANEOUS
  Administered 2021-05-10: 1 [IU] via SUBCUTANEOUS
  Administered 2021-05-10: 3 [IU] via SUBCUTANEOUS

## 2021-05-07 MED ORDER — ONDANSETRON HCL 4 MG PO TABS: 4.0000 mg | ORAL_TABLET | Freq: Four times a day (QID) | ORAL | Status: DC | PRN

## 2021-05-07 MED ORDER — IPRATROPIUM-ALBUTEROL 0.5-2.5 (3) MG/3ML IN SOLN: 3.0000 mL | RESPIRATORY_TRACT | Status: DC | PRN

## 2021-05-07 MED ORDER — SERTRALINE HCL 100 MG PO TABS
100.0000 mg | ORAL_TABLET | Freq: Every day | ORAL | Status: DC
  Administered 2021-05-07 – 2021-05-10 (×4): 100 mg via ORAL
  Filled 2021-05-07 (×5): qty 1

## 2021-05-07 MED ORDER — HYDRALAZINE HCL 20 MG/ML IJ SOLN: 10.0000 mg | INTRAMUSCULAR | Status: DC | PRN

## 2021-05-07 MED ORDER — HYDROCHLOROTHIAZIDE 25 MG PO TABS
50.0000 mg | ORAL_TABLET | Freq: Every day | ORAL | Status: DC
  Administered 2021-05-07 – 2021-05-10 (×4): 50 mg via ORAL
  Filled 2021-05-07 (×4): qty 2

## 2021-05-07 MED ORDER — LEVETIRACETAM 500 MG PO TABS
500.0000 mg | ORAL_TABLET | Freq: Two times a day (BID) | ORAL | Status: DC
  Administered 2021-05-07 – 2021-05-10 (×4): 500 mg via ORAL
  Filled 2021-05-07 (×5): qty 1

## 2021-05-07 MED ORDER — DAPAGLIFLOZIN PROPANEDIOL 10 MG PO TABS
10.0000 mg | ORAL_TABLET | Freq: Every morning | ORAL | Status: DC
  Administered 2021-05-08 – 2021-05-10 (×3): 10 mg via ORAL
  Filled 2021-05-07 (×3): qty 1

## 2021-05-07 MED ORDER — LOSARTAN POTASSIUM 50 MG PO TABS
100.0000 mg | ORAL_TABLET | Freq: Every day | ORAL | Status: DC
  Administered 2021-05-07 – 2021-05-10 (×4): 100 mg via ORAL
  Filled 2021-05-07 (×4): qty 2

## 2021-05-07 MED ORDER — ONDANSETRON HCL 4 MG/2ML IJ SOLN: 4.0000 mg | Freq: Four times a day (QID) | INTRAMUSCULAR | Status: DC | PRN

## 2021-05-07 MED ORDER — ACETAMINOPHEN 325 MG PO TABS
650.0000 mg | ORAL_TABLET | Freq: Four times a day (QID) | ORAL | Status: DC | PRN
  Administered 2021-05-08 – 2021-05-10 (×3): 650 mg via ORAL
  Filled 2021-05-07 (×3): qty 2

## 2021-05-07 MED ORDER — SENNOSIDES-DOCUSATE SODIUM 8.6-50 MG PO TABS: 1.0000 | ORAL_TABLET | Freq: Every evening | ORAL | Status: DC | PRN
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
## 2021-05-07 NOTE — Progress Notes (Signed)
vLTM setup. All impedances below 8kohms,  [facility] to monitor    Pt event button tested
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
## 2021-05-07 NOTE — Plan of Care (Signed)

## 2021-05-08 ENCOUNTER — Encounter (HOSPITAL_COMMUNITY): Payer: Self-pay | Admitting: Internal Medicine

## 2021-05-08 ENCOUNTER — Other Ambulatory Visit: Payer: Self-pay

## 2021-05-08 LAB — CBC
HCT: 47.8 % — ABNORMAL HIGH (ref 36.0–46.0)
Hemoglobin: 16 g/dL — ABNORMAL HIGH (ref 12.0–15.0)
MCH: 29.6 pg (ref 26.0–34.0)
MCHC: 33.5 g/dL (ref 30.0–36.0)
MCV: 88.5 fL (ref 80.0–100.0)
Platelets: 307 10*3/uL (ref 150–400)
RBC: 5.4 MIL/uL — ABNORMAL HIGH (ref 3.87–5.11)
RDW: 12.9 % (ref 11.5–15.5)
WBC: 8.5 10*3/uL (ref 4.0–10.5)
nRBC: 0 % (ref 0.0–0.2)

## 2021-05-08 LAB — BASIC METABOLIC PANEL
Anion gap: 8 (ref 5–15)
BUN: 14 mg/dL (ref 6–20)
CO2: 25 mmol/L (ref 22–32)
Calcium: 9 mg/dL (ref 8.9–10.3)
Chloride: 100 mmol/L (ref 98–111)
Creatinine, Ser: 1.05 mg/dL — ABNORMAL HIGH (ref 0.44–1.00)
GFR, Estimated: >60 mL/min (ref >60)
Glucose, Bld: 146 mg/dL — ABNORMAL HIGH (ref 70–99)
Potassium: 3.2 mmol/L — ABNORMAL LOW (ref 3.5–5.1)
Sodium: 133 mmol/L — ABNORMAL LOW (ref 135–145)

## 2021-05-08 LAB — GLUCOSE, CAPILLARY
Glucose-Capillary: 145 mg/dL — ABNORMAL HIGH (ref 70–99)
Glucose-Capillary: 254 mg/dL — ABNORMAL HIGH (ref 70–99)
Glucose-Capillary: 210 mg/dL — ABNORMAL HIGH (ref 70–99)
Glucose-Capillary: 211 mg/dL — ABNORMAL HIGH (ref 70–99)

## 2021-05-08 LAB — MAGNESIUM: Magnesium: 2.2 mg/dL (ref 1.7–2.4)

## 2021-05-08 MED ORDER — POTASSIUM CHLORIDE CRYS ER 20 MEQ PO TBCR
40.0000 meq | EXTENDED_RELEASE_TABLET | Freq: Once | ORAL | Status: AC
  Administered 2021-05-08: 40 meq via ORAL
  Filled 2021-05-08: qty 2

## 2021-05-08 MED ORDER — SODIUM CHLORIDE 0.9 % IV SOLN: INTRAVENOUS | Status: AC
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
## 2021-05-08 NOTE — Plan of Care (Signed)
  Problem: Education: Goal: Knowledge of General Education information will improve Description: Including pain rating scale, medication(s)/side effects and non-pharmacologic comfort measures Outcome: Progressing   Problem: Health Behavior/Discharge Planning: Goal: Ability to manage health-related needs will improve Outcome: Progressing   Problem: Clinical Measurements: Goal: Ability to maintain clinical measurements within normal limits will improve Outcome: Progressing Goal: Will remain free from infection Outcome: Progressing Goal: Diagnostic test results will improve Outcome: Progressing Goal: Respiratory complications will improve Outcome: Progressing Goal: Cardiovascular complication will be avoided Outcome: Progressing   Problem: Activity: Goal: Risk for activity intolerance will decrease Outcome: Progressing   Problem: Nutrition: Goal: Adequate nutrition will be maintained Outcome: Progressing   Problem: Coping: Goal: Level of anxiety will decrease Outcome: Progressing   Problem: Elimination: Goal: Will not experience complications related to bowel motility Outcome: Progressing Goal: Will not experience complications related to urinary retention Outcome: Progressing   Problem: Pain Managment: Goal: General experience of comfort will improve Outcome: Progressing   Problem: Safety: Goal: Ability to remain free from injury will improve Outcome: Progressing   Problem: Skin Integrity: Goal: Risk for impaired skin integrity will decrease Outcome: Progressing   Problem: Education: Goal: Expressions of having a comfortable level of knowledge regarding the disease process will increase Outcome: Progressing   Problem: Coping: Goal: Ability to adjust to condition or change in health will improve Outcome: Progressing Goal: Ability to identify appropriate support needs will improve Outcome: Progressing   Problem: Health Behavior/Discharge Planning: Goal:  Compliance with prescribed medication regimen will improve Outcome: Progressing   Problem: Clinical Measurements: Goal: Complications related to the disease process, condition or treatment will be avoided or minimized Outcome: Progressing Goal: Diagnostic test results will improve Outcome: Progressing   Problem: Safety: Goal: Verbalization of understanding the information provided will improve Outcome: Progressing   Problem: Self-Concept: Goal: Level of anxiety will decrease Outcome: Progressing Goal: Ability to verbalize feelings about condition will improve Outcome: Progressing

## 2021-05-08 NOTE — Progress Notes (Signed)
LTM maint complete - no skin breakdown under:  Cz Fz P3

## 2021-05-08 NOTE — Progress Notes (Signed)
Pt seen at bedside.  Doing well.  Pt is being transferred to [facility] today for continuous EEG.  No charge note.  Pt discharged already on 10/29.  Pls see d/c summary of that date.
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
## 2021-05-08 NOTE — TOC Initial Note (Signed)
Transition of Care [facility]) - Initial/Assessment Note    Patient Details  Name: Kerry Webb MRN: [id number] Date of Birth: [birth year]  Transition of Care [facility]) CM/SW Contact:    Pollie Friar, RN Phone Number: 05/08/2021, 2:19 PM  Clinical Narrative:                 Patient is from home alone. She states her children live in [place] and [place]. She states her landlord lives across from her but no other contacts.  She says she is behind on the rent and has a car but wont be able to drive for a while after this hospitalization.  She had a PCP but has not been able to go recently due to the copays. She also has not been taking any medications due to the cost.  CM will provide her information on: [facility]--will provide her PCP and assist with medications, information on transportation, resources to call to assist with her rent and utilities.  TOC following.  Expected Discharge Plan: Home/Self Care Barriers to Discharge: Continued Medical Work up, Inadequate or no insurance   Patient Goals and CMS Choice        Expected Discharge Plan and Services Expected Discharge Plan: Home/Self Care   Discharge Planning Services: CM Consult   Living arrangements for the past 2 months: Single Family Home                                      Prior Living Arrangements/Services Living arrangements for the past 2 months: Single Family Home Lives with:: Self Patient language and need for interpreter reviewed:: Yes Do you feel safe going back to the place where you live?: Yes        Care giver support system in place?: No (comment)   Criminal Activity/Legal Involvement Pertinent to Current Situation/Hospitalization: No - Comment as needed  Activities of Daily Living Home Assistive Devices/Equipment: None ADL Screening (condition at time of admission) Patient's cognitive ability adequate to safely complete daily activities?: Yes Is the patient deaf or have  difficulty hearing?: No Does the patient have difficulty seeing, even when wearing glasses/contacts?: No Does the patient have difficulty concentrating, remembering, or making decisions?: No Patient able to express need for assistance with ADLs?: Yes Does the patient have difficulty dressing or bathing?: No Independently performs ADLs?: Yes (appropriate for developmental age) Does the patient have difficulty walking or climbing stairs?: No Weakness of Legs: None Weakness of Arms/Hands: None  Permission Sought/Granted                  Emotional Assessment Appearance:: Appears stated age Attitude/Demeanor/Rapport: Engaged Affect (typically observed): Accepting Orientation: : Oriented to Self, Oriented to Place, Oriented to  Time, Oriented to Situation   Psych Involvement: No (comment)  Admission diagnosis:  Syncope [R55] Patient Active Problem List   Diagnosis Date Noted   Syncope 05/07/2021   Syncope and collapse 04/30/2021   Primary hypertension    Acute cholecystitis 05/17/2019   Family history of breast cancer 01/06/2013   History of nipple discharge 01/06/2013   PCP:  Juluis Pitch, MD Pharmacy:   [place], [place] [address] Phone: [phone] Fax: [phone]  [id number] - [place], [place] [address] [place] [address] Phone: [phone] Fax: [phone]  Medication Management  [facility] [address] [place] [address] Phone: [phone] Fax: [phone]     Social Determinants of Health (SDOH) Interventions    Readmission Risk Interventions No flowsheet data found.

## 2021-05-09 LAB — CBC
HCT: 44.7 % (ref 36.0–46.0)
Hemoglobin: 15 g/dL (ref 12.0–15.0)
MCH: 29.8 pg (ref 26.0–34.0)
MCHC: 33.6 g/dL (ref 30.0–36.0)
MCV: 88.9 fL (ref 80.0–100.0)
Platelets: 293 10*3/uL (ref 150–400)
RBC: 5.03 MIL/uL (ref 3.87–5.11)
RDW: 12.8 % (ref 11.5–15.5)
WBC: 8.9 10*3/uL (ref 4.0–10.5)
nRBC: 0 % (ref 0.0–0.2)

## 2021-05-09 LAB — BASIC METABOLIC PANEL
Anion gap: 5 (ref 5–15)
BUN: 23 mg/dL — ABNORMAL HIGH (ref 6–20)
CO2: 28 mmol/L (ref 22–32)
Calcium: 8.6 mg/dL — ABNORMAL LOW (ref 8.9–10.3)
Chloride: 103 mmol/L (ref 98–111)
Creatinine, Ser: 0.89 mg/dL (ref 0.44–1.00)
GFR, Estimated: >60 mL/min (ref >60)
Glucose, Bld: 187 mg/dL — ABNORMAL HIGH (ref 70–99)
Potassium: 2.9 mmol/L — ABNORMAL LOW (ref 3.5–5.1)
Sodium: 136 mmol/L (ref 135–145)

## 2021-05-09 LAB — GLUCOSE, CAPILLARY
Glucose-Capillary: 170 mg/dL — ABNORMAL HIGH (ref 70–99)
Glucose-Capillary: 171 mg/dL — ABNORMAL HIGH (ref 70–99)
Glucose-Capillary: 206 mg/dL — ABNORMAL HIGH (ref 70–99)
Glucose-Capillary: 177 mg/dL — ABNORMAL HIGH (ref 70–99)

## 2021-05-09 LAB — MAGNESIUM: Magnesium: 2.5 mg/dL — ABNORMAL HIGH (ref 1.7–2.4)

## 2021-05-09 MED ORDER — POTASSIUM CHLORIDE CRYS ER 20 MEQ PO TBCR
40.0000 meq | EXTENDED_RELEASE_TABLET | Freq: Three times a day (TID) | ORAL | Status: AC
  Administered 2021-05-09 (×3): 40 meq via ORAL
  Filled 2021-05-09 (×3): qty 2

## 2021-05-09 MED ORDER — POTASSIUM CHLORIDE 10 MEQ/100ML IV SOLN
10.0000 meq | INTRAVENOUS | Status: AC
  Administered 2021-05-09 (×2): 10 meq via INTRAVENOUS
  Filled 2021-05-09 (×2): qty 100

## 2021-05-09 NOTE — Plan of Care (Signed)

## 2021-05-09 NOTE — Progress Notes (Signed)
LTM maintain done. EEG leads: Cz, C3, Pz, C4, and Fz all reapplied and reglued. Impedance good. No skin breakdown noted. Results pending Patient reports having some marked (push button) events last night.
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
## 2021-05-09 NOTE — Progress Notes (Signed)
Performed EEG maintenance.  Reapplied EKG leads.  Reapplied C4, Fp1, Fp2, C3.  No skin breakdown observed at these electrode sites.  Secured patient event button within patient's reach on bed rail.

## 2021-05-09 NOTE — Progress Notes (Signed)
   05/09/21 1015  Clinical Encounter Type  Visited With Patient  Visit Type Follow-up;Spiritual support  Referral From Chaplain  Consult/Referral To Chaplain   This Chaplain responded to a follow-up request to check if the patient had completed the Advanced Directive. The patient stated she needed more time and would inform her nurse when she was ready to meet with the notary. The patient spoke of her family as being a good support system. Spiritual care of prayer offered. This note was prepared by Jeanine Luz, M.Div..  For questions please contact by phone [phone].

## 2021-05-09 NOTE — Progress Notes (Signed)
Inpatient Diabetes Program Recommendations  AACE/ADA: New Consensus Statement on Inpatient Glycemic Control (2015)  Target Ranges:  Prepandial:   less than 140 mg/dL      Peak postprandial:   less than 180 mg/dL (1-2 hours)      Critically ill patients:  140 - 180 mg/dL   Lab Results  Component Value Date   GLUCAP 170 (H) 05/09/2021   HGBA1C 9.1 (H) 04/29/2021    Review of Glycemic Control Results for Kerry Webb, Kerry Webb (MRN [id number]) as of 05/09/2021 10:49  Ref. Range 05/08/2021 06:19 05/08/2021 11:56 05/08/2021 15:51 05/08/2021 21:14 05/09/2021 06:09  Glucose-Capillary Latest Ref Range: 70 - 99 mg/dL 145 (H) 254 (H) 210 (H) 211 (H) 170 (H)   Diabetes history: DM 2 Outpatient Diabetes medications: Farxiga 10 mg Daily, Lantus 30 units Daily Current orders for Inpatient glycemic control:  Farxiga 10 mg Daily Semglee 30 units Novolog 0-9 units tid + hs 9.1% on 10/24  Inpatient Diabetes Program Recommendations:    - Consider Novolog 2-3 units tid meal coverage if pt eating >50% of meals  Thanks,  Tama Headings RN, MSN, BC-ADM Inpatient Diabetes Coordinator Team Pager [phone] (8a-5p)
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
## 2021-05-10 ENCOUNTER — Other Ambulatory Visit (HOSPITAL_COMMUNITY): Payer: Self-pay

## 2021-05-10 ENCOUNTER — Other Ambulatory Visit: Payer: Self-pay | Admitting: Physician Assistant

## 2021-05-10 DIAGNOSIS — R4689 Other symptoms and signs involving appearance and behavior: Secondary | ICD-10-CM

## 2021-05-10 DIAGNOSIS — R55 Syncope and collapse: Secondary | ICD-10-CM

## 2021-05-10 LAB — MAGNESIUM: Magnesium: 2.5 mg/dL — ABNORMAL HIGH (ref 1.7–2.4)

## 2021-05-10 LAB — BASIC METABOLIC PANEL
Anion gap: 6 (ref 5–15)
BUN: 14 mg/dL (ref 6–20)
CO2: 26 mmol/L (ref 22–32)
Calcium: 9.1 mg/dL (ref 8.9–10.3)
Chloride: 107 mmol/L (ref 98–111)
Creatinine, Ser: 0.74 mg/dL (ref 0.44–1.00)
GFR, Estimated: >60 mL/min (ref >60)
Glucose, Bld: 151 mg/dL — ABNORMAL HIGH (ref 70–99)
Potassium: 3.6 mmol/L (ref 3.5–5.1)
Sodium: 139 mmol/L (ref 135–145)

## 2021-05-10 LAB — CBC
HCT: 45.1 % (ref 36.0–46.0)
Hemoglobin: 15.1 g/dL — ABNORMAL HIGH (ref 12.0–15.0)
MCH: 29.7 pg (ref 26.0–34.0)
MCHC: 33.5 g/dL (ref 30.0–36.0)
MCV: 88.8 fL (ref 80.0–100.0)
Platelets: 278 10*3/uL (ref 150–400)
RBC: 5.08 MIL/uL (ref 3.87–5.11)
RDW: 12.6 % (ref 11.5–15.5)
WBC: 8.4 10*3/uL (ref 4.0–10.5)
nRBC: 0 % (ref 0.0–0.2)

## 2021-05-10 LAB — GLUCOSE, CAPILLARY
Glucose-Capillary: 142 mg/dL — ABNORMAL HIGH (ref 70–99)
Glucose-Capillary: 216 mg/dL — ABNORMAL HIGH (ref 70–99)
Glucose-Capillary: 225 mg/dL — ABNORMAL HIGH (ref 70–99)

## 2021-05-10 MED ORDER — LEVETIRACETAM 500 MG PO TABS
500.0000 mg | ORAL_TABLET | Freq: Two times a day (BID) | ORAL | 0 refills | Status: DC
  Filled 2021-05-10: qty 60, 30d supply, fill #0

## 2021-05-10 MED ORDER — LEVETIRACETAM 500 MG PO TABS: 500.0000 mg | ORAL_TABLET | Freq: Two times a day (BID) | ORAL | Status: DC

## 2021-05-10 MED ORDER — SODIUM CHLORIDE 0.9 % IV SOLN
1750.0000 mg | Freq: Once | INTRAVENOUS | Status: AC
  Administered 2021-05-10: 1750 mg via INTRAVENOUS
  Filled 2021-05-10: qty 17.5

## 2021-05-10 NOTE — TOC Transition Note (Signed)
Transition of Care [facility]) - CM/SW Discharge Note   Patient Details  Name: Kerry Webb MRN: [id number] Date of Birth: June 01, 1966  [facility] of [facility]) CM/SW Contact:  Pollie Friar, RN Phone Number: 05/10/2021, 3:21 PM   Clinical Narrative:    Pt is discharging home with self care. Medication for home covered by [facility] department and will be delivered to the room. Pt states she has transport home.  CM encouraged her to use the resources provided to her for assistance with transportation and bill pay/ rent.    Final next level of care: Home/Self Care Barriers to Discharge: Inadequate or no insurance, Barriers Unresolved (comment)   Patient Goals and CMS Choice        Discharge Placement                       Discharge Plan and Services   Discharge Planning Services: CM Consult                                 Social Determinants of Health (SDOH) Interventions     Readmission Risk Interventions No flowsheet data found.
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
## 2021-05-10 NOTE — Discharge Instructions (Signed)
  Do not drive, operating heavy machinery, perform activities at heights, swimming or participation in water activities or provide baby sitting services ,as your were admitted for syncope vs seizures for 6 months, until you have seen by Primary MD or a Neurologist and advised to do so again.  Follow with Primary MD Juluis Pitch, MD in 7 days   Get CBC, CMP, 2 view Chest X ray checked  by Primary MD next visit.    Activity: As tolerated with Full fall precautions use walker/cane & assistance as needed   Disposition Home **   Diet: Heart Healthy ** , with feeding assistance and aspiration precautions.  For Heart failure patients - Check your Weight same time everyday, if you gain over 2 pounds, or you develop in leg swelling, experience more shortness of breath or chest pain, call your Primary MD immediately. Follow Cardiac Low Salt Diet and 1.5 lit/day fluid restriction.   On your next visit with your primary care physician please Get Medicines reviewed and adjusted.   Please request your Prim.MD to go over all Hospital Tests and Procedure/Radiological results at the follow up, please get all Hospital records sent to your Prim MD by signing hospital release before you go home.   If you experience worsening of your admission symptoms, develop shortness of breath, life threatening emergency, suicidal or homicidal thoughts you must seek medical attention immediately by calling 911 or calling your MD immediately  if symptoms less severe.  You Must read complete instructions/literature along with all the possible adverse reactions/side effects for all the Medicines you take and that have been prescribed to you. Take any new Medicines after you have completely understood and accpet all the possible adverse reactions/side effects.    Do not drive when taking Pain medications.    Do not take more than prescribed Pain, Sleep and Anxiety Medications  Special Instructions: If you have  smoked or chewed Tobacco  in the last 2 yrs please stop smoking, stop any regular Alcohol  and or any Recreational drug use.  Wear Seat belts while driving.   Please note  You were cared for by a hospitalist during your hospital stay. If you have any questions about your discharge medications or the care you received while you were in the hospital after you are discharged, you can call the unit and asked to speak with the hospitalist on call if the hospitalist that took care of you is not available. Once you are discharged, your primary care physician will handle any further medical issues. Please note that NO REFILLS for any discharge medications will be authorized once you are discharged, as it is imperative that you return to your primary care physician (or establish a relationship with a primary care physician if you do not have one) for your aftercare needs so that they can reassess your need for medications and monitor your lab values.

## 2021-05-10 NOTE — Progress Notes (Signed)
LTM EEG discontinued - no skin breakdown at unhook.   

## 2021-05-10 NOTE — Progress Notes (Signed)
This chaplain responded to RN page for notarizing the Pt. Advance Directive:  HCPOA and Living Will.  The chaplain is present with the Pt., notary, and two witnesses.  The Pt. named Wallie Char, Brooke Bonito. as her healthcare agent. If this person is unable or unwilling to serve as the Pt. healthcare agent, the Pt. next choice is Gabrielle Dare.  The chaplain gave the original AD along with two copies to the Pt.  The Pt. scanned a copy to the Pt. EMR.  This chaplain is available for F/U spiritual care as needed.  Chaplain Sallyanne Kuster [phone]

## 2021-05-10 NOTE — Progress Notes (Signed)
LTM maint complete - no skin breakdown under:Fp1 Fp2 Fz reapplied CZ P8 FZ

## 2021-05-13 ENCOUNTER — Encounter: Payer: Self-pay | Admitting: *Deleted

## 2021-05-13 NOTE — Progress Notes (Signed)
Patient ID: Kerry Webb, female   DOB: April 26, 1966, 55 y.o.   MRN: [id number] Patient enrolled for [employer] to ship a 30 day cardiac event monitor to her address on file. Letter with instructions and self pay discount information mailed to patient.

## 2021-05-20 ENCOUNTER — Emergency Department (INDEPENDENT_AMBULATORY_CARE_PROVIDER_SITE_OTHER): Payer: Self-pay

## 2021-05-20 DIAGNOSIS — R55 Syncope and collapse: Secondary | ICD-10-CM

## 2021-05-28 ENCOUNTER — Ambulatory Visit: Payer: Self-pay | Admitting: Neurology

## 2021-06-13 ENCOUNTER — Other Ambulatory Visit: Payer: Self-pay

## 2021-06-13 ENCOUNTER — Encounter: Payer: Self-pay | Admitting: Gerontology

## 2021-06-13 ENCOUNTER — Ambulatory Visit: Payer: Self-pay | Admitting: Gerontology

## 2021-06-13 VITALS — BP 149/89 | HR 82 | Temp 98.0°F | Resp 17 | Ht 65.0 in | Wt 187.6 lb

## 2021-06-13 DIAGNOSIS — I10 Essential (primary) hypertension: Secondary | ICD-10-CM

## 2021-06-13 DIAGNOSIS — E119 Type 2 diabetes mellitus without complications: Secondary | ICD-10-CM

## 2021-06-13 DIAGNOSIS — E1169 Type 2 diabetes mellitus with other specified complication: Secondary | ICD-10-CM

## 2021-06-13 DIAGNOSIS — R4589 Other symptoms and signs involving emotional state: Secondary | ICD-10-CM | POA: Insufficient documentation

## 2021-06-13 DIAGNOSIS — Z87898 Personal history of other specified conditions: Secondary | ICD-10-CM

## 2021-06-13 DIAGNOSIS — Z7689 Persons encountering health services in other specified circumstances: Secondary | ICD-10-CM | POA: Insufficient documentation

## 2021-06-13 LAB — GLUCOSE, POCT (MANUAL RESULT ENTRY): POC Glucose: 443 mg/dl — AB (ref 70–99)

## 2021-06-13 MED ORDER — INSULIN PEN NEEDLE 32G X 4 MM MISC
0 refills | Status: AC
  Filled 2021-06-13: qty 100, fill #0
  Filled 2021-08-19: qty 100, 100d supply, fill #0

## 2021-06-13 MED ORDER — SERTRALINE HCL 100 MG PO TABS
100.0000 mg | ORAL_TABLET | Freq: Every day | ORAL | 0 refills | Status: DC
  Filled 2021-06-13: qty 30, 30d supply, fill #0
  Filled 2021-07-18: qty 30, 30d supply, fill #1
  Filled 2021-08-19: qty 30, 30d supply, fill #2

## 2021-06-13 MED ORDER — ATORVASTATIN CALCIUM 40 MG PO TABS
40.0000 mg | ORAL_TABLET | Freq: Every day | ORAL | 0 refills | Status: DC
  Filled 2021-06-13: qty 30, 30d supply, fill #0
  Filled 2021-07-18: qty 30, 30d supply, fill #1
  Filled 2021-08-19: qty 30, 30d supply, fill #2

## 2021-06-13 MED ORDER — BASAGLAR KWIKPEN 100 UNIT/ML ~~LOC~~ SOPN
30.0000 [IU] | PEN_INJECTOR | Freq: Every day | SUBCUTANEOUS | 5 refills | Status: DC
  Filled 2021-06-13 – 2021-06-17 (×2): qty 15, 50d supply, fill #0
  Filled 2021-08-19: qty 15, 50d supply, fill #1
  Filled 2021-10-04: qty 15, 50d supply, fill #2

## 2021-06-13 MED ORDER — HYDRALAZINE HCL 50 MG PO TABS
75.0000 mg | ORAL_TABLET | Freq: Every day | ORAL | 0 refills | Status: DC
  Filled 2021-06-13: qty 45, 30d supply, fill #0

## 2021-06-13 MED ORDER — DAPAGLIFLOZIN PROPANEDIOL 10 MG PO TABS
10.0000 mg | ORAL_TABLET | Freq: Every morning | ORAL | 0 refills | Status: DC
  Filled 2021-06-13: qty 30, 30d supply, fill #0
  Filled 2021-07-18: qty 30, 30d supply, fill #1
  Filled 2021-08-19: qty 30, 30d supply, fill #2

## 2021-06-13 MED ORDER — METOPROLOL TARTRATE 50 MG PO TABS
75.0000 mg | ORAL_TABLET | Freq: Two times a day (BID) | ORAL | 0 refills | Status: DC
  Filled 2021-06-13: qty 90, 30d supply, fill #0

## 2021-06-13 MED ORDER — INSULIN GLARGINE 100 UNIT/ML SOLOSTAR PEN
30.0000 [IU] | PEN_INJECTOR | Freq: Every morning | SUBCUTANEOUS | 0 refills | Status: DC
  Filled 2021-06-13: qty 27, 90d supply, fill #0

## 2021-06-13 MED ORDER — LOSARTAN POTASSIUM 100 MG PO TABS
100.0000 mg | ORAL_TABLET | Freq: Every day | ORAL | 0 refills | Status: DC
  Filled 2021-06-13: qty 30, 30d supply, fill #0
  Filled 2021-07-18: qty 30, 30d supply, fill #1
  Filled 2021-08-19: qty 30, 30d supply, fill #2

## 2021-06-13 MED ORDER — LEVETIRACETAM 500 MG PO TABS
500.0000 mg | ORAL_TABLET | Freq: Two times a day (BID) | ORAL | 0 refills | Status: DC
  Filled 2021-06-13: qty 50, 25d supply, fill #0

## 2021-06-13 NOTE — Progress Notes (Signed)
New Patient Office Visit  Subjective:  Patient ID: Kerry Webb, female    DOB: [birth year]  Age: 55 y.o. MRN: [id number]  CC:  Chief Complaint  Patient presents with   Establish Care   Diabetes   Hypertension    HPI Kerry Webb is a 55 year old female who has history of type 2 diabetes mellitus, GERD, heart murmur, hypertension ,seizure , who presents to establish care and evaluation of her chronic conditions. She was discharged from the hospital on 05/10/21 for recurrent syncope. During her hospital course, she had CT head, MRI brain, EEG, echocardiogram which were unremarkable. She was started on KEPPRA 500 mg BID. She was to follow up with Urology, driving restriction for 6 months  until cleared by Neurology and Cardiology follow up for 30 day event monitor. Currently, she denies any seizure activities. Her HgbA1c was 9.1% done on 04/29/21. She states that she has not checked her blood glucose since discharged from the hospital, but denies hypoglycemic symptoms. Her blood glucose was 443 mg per DL when checked during visit.  She reports that she takes 30 units of Lantus and 10 mg of Farxiga. She also has a history of hypertension, has not check her blood pressure , takes 75 mg of hydralazine 3 times daily, 50 mg of hydrochlorothiazide, 75 mg of metoprolol twice daily and 100 mg losartan daily.  She also has a history of anxiety and depression and was started on 100 mg of Zoloft.  She states that her mood is dysphoric, denies suicidal or homicidal ideation.  Overall, she states that she is doing well and offers no further complaint.  Past Medical History:  Diagnosis Date   Diabetes mellitus without complication ([place])    GERD (gastroesophageal reflux disease)    Heart murmur 1967   Hypertension 1993   Lump or mass in breast    Left   Seizures ([place]) 1983   Ulcer 1985    Past Surgical History:  Procedure Laterality Date   ABDOMINAL HYSTERECTOMY  2004   APPENDECTOMY  2014    BREAST BIOPSY Right 01-23-14   benign   BREAST SURGERY Left 01/14/12   BREAST SURGERY Right 05-16-14   benign   CHOLECYSTECTOMY N/A 05/18/2019   Procedure: LAPAROSCOPIC CHOLECYSTECTOMY;  Surgeon: Benjamine Sprague, DO;  Location: [facility] ORS;  Service: General;  Laterality: N/A;   COLONOSCOPY  2013   Dr. Candace Cruise    Family History  Problem Relation Age of Onset   Liver cancer Mother    Cancer Mother        liver   Thyroid disease Mother    Kidney disease Father    Alcohol abuse Father    Other Brother        auto accident   Diabetes Maternal Aunt    Breast cancer Maternal Aunt    Cancer Maternal Grandmother        breast   Hypertension Paternal Grandmother    Kidney disease Paternal Grandfather    Hypertension Paternal Grandfather     Social History   Socioeconomic History   Marital status: Married    Spouse name: Not on file   Number of children: Not on file   Years of education: Not on file   Highest education level: Not on file  Occupational History   Not on file  Tobacco Use   Smoking status: Never   Smokeless tobacco: Never  Vaping Use   Vaping Use: Never used  Substance and Sexual Activity  Alcohol use: Not Currently    Alcohol/week: 1.0 standard drink    Types: 1 Glasses of wine per week    Comment: last use 03/2021   Drug use: Never   Sexual activity: Not on file  Other Topics Concern   Not on file  Social History Narrative   Not on file   Social Determinants of Health   Financial Resource Strain: Not on file  Food Insecurity: Food Insecurity Present   Worried About [place] in the Last Year: Sometimes true   Ran Out of Food in the Last Year: Sometimes true  Transportation Needs: No Transportation Needs   Lack of Transportation (Medical): No   Lack of Transportation (Non-Medical): No  Physical Activity: Not on file  Stress: Not on file  Social Connections: Not on file  Intimate Partner Violence: Not on file    ROS Review of Systems   Constitutional: Negative.   HENT: Negative.    Eyes: Negative.   Respiratory: Negative.    Cardiovascular: Negative.   Gastrointestinal: Negative.   Endocrine: Negative.   Genitourinary: Negative.   Musculoskeletal: Negative.   Skin: Negative.   Neurological: Negative.   Hematological: Negative.   Psychiatric/Behavioral:  Positive for dysphoric mood.    Objective:   Today's Vitals: BP (!) 149/89 (BP Location: Right Arm, Patient Position: Sitting, Cuff Size: Large)   Pulse 82   Temp 98 F (36.7 C) (Oral)   Resp 17   Ht _0  (1.651 m)   Wt 187 lb 9.6 oz (85.1 kg)   SpO2 95%   BMI 31.22 kg/m   Physical Exam HENT:     Head: Normocephalic and atraumatic.     Nose: Nose normal.     Mouth/Throat:     Mouth: Mucous membranes are moist.  Eyes:     Extraocular Movements: Extraocular movements intact.     Conjunctiva/sclera: Conjunctivae normal.     Pupils: Pupils are equal, round, and reactive to light.  Cardiovascular:     Rate and Rhythm: Normal rate and regular rhythm.     Pulses: Normal pulses.     Heart sounds: Normal heart sounds.  Pulmonary:     Effort: Pulmonary effort is normal.     Breath sounds: Normal breath sounds.  Abdominal:     General: Bowel sounds are normal.     Palpations: Abdomen is soft.  Genitourinary:    Comments: Deferred per patient Musculoskeletal:        General: Normal range of motion.     Cervical back: Normal range of motion.  Skin:    General: Skin is warm.  Neurological:     General: No focal deficit present.     Mental Status: She is alert and oriented to person, place, and time. Mental status is at baseline.  Psychiatric:        Mood and Affect: Mood normal.        Behavior: Behavior normal.        Thought Content: Thought content normal.        Judgment: Judgment normal.    Assessment & Plan:    1. Primary hypertension -Her blood pressure is not under control, her goal should be less than 140/90.  Her hydrochlorothiazide  was discontinued and hydralazine was decreased to 75 mg daily.  She was advised to check her blood pressure 2 times a day, record and bring log to clinic next week.  She was advised to notify clinic for elevated blood pressure.  She was advised to continue on DASH diet and exercise as tolerated.  She was educated on stroke symptoms and advised to go to the emergency room. - losartan (COZAAR) 100 MG tablet; Take 1 tablet (100 mg total) by mouth once daily.  Dispense: 90 tablet; Refill: 0 - metoprolol tartrate (LOPRESSOR) 50 MG tablet; Take 1 and (1/2) tablet (75 mg total) by mouth 2 (two) times daily.  Dispense: 270 tablet; Refill: 0 - hydrALAZINE (APRESOLINE) 50 MG tablet; Take 1 and (1/2) tablet (75 mg total) by mouth daily at 2 PM.  Dispense: 60 tablet; Refill: 0 - Ambulatory referral to Cardiology  2. History of seizure -She will continue on her seizure medication and follow-up with neurology. - levETIRAcetam (KEPPRA) 500 MG tablet; Take 1 tablet (500 mg total) by mouth 2 (two) times daily.  Dispense: 60 tablet; Refill: 0 - Ambulatory referral to Neurology  3. Encounter to establish care -She was advised to adhere to her hospital discharge instructions and chemistry will be checked. - Comp Met (CMET); Future - Comp Met (CMET)  4. Dysphoric mood -She will continue on Zoloft, and will [facility]. - sertraline (ZOLOFT) 100 MG tablet; Take 1 tablet (100 mg total) by mouth once daily.  Dispense: 90 tablet; Refill: 0  5. Type 2 diabetes mellitus without complication, with long-term current use of insulin (HCC) -Her hemoglobin A1c was 9.1% and her goal should be less than 7%.  She will continue on current regimen, advised to check her blood glucose at least twice daily,record and bring the log to her appointment next week.  She was advised that her fasting blood glucose level goal should be between 80 and 130 mg per DL.  She was advised to continue on low  carbohydrate/no concentrated sweet diet and exercise as tolerated. - dapagliflozin propanediol (FARXIGA) 10 MG TABS tablet; Take 1 tablet (10 mg total) by mouth once every morning.  Dispense: 90 tablet; Refill: 0 - Insulin Pen Needle 32G X 4 MM MISC; Use as directed with insulin pen  Dispense: 100 each; Refill: 0 - POCT Glucose (CBG); Future - POCT Glucose (CBG) - Insulin Glargine (BASAGLAR KWIKPEN) 100 UNIT/ML; Inject 30 Units into the skin once daily at bedtime.  Dispense: 15 mL; Refill: 5  6. Hyperlipidemia associated with type 2 diabetes mellitus ([place]) -The 10-year ASCVD risk score (Arnett DK, et al., 2019) is: 27.8%   Values used to calculate the score:     Age: 83 years     Sex: Female     Is Non-Hispanic African American: Yes     Diabetic: Yes     Tobacco smoker: No     Systolic Blood Pressure: 009 mmHg     Is BP treated: Yes     HDL Cholesterol: 34 mg/dL     Total Cholesterol: 196 mg/dL Had ASCVD risk was 27.8%, she will continue on current medication, low-fat low-cholesterol diet and exercise as tolerated. - atorvastatin (LIPITOR) 40 MG tablet; Take 1 tablet (40 mg total) by mouth once daily.  Dispense: 90 tablet; Refill: 0     Follow-up: Return in about 6 days (around 06/19/2021), or if symptoms worsen or fail to improve.   [place]elson Jerold Coombe, NP

## 2021-06-13 NOTE — Patient Instructions (Signed)
DASH Eating Plan DASH stands for Dietary Approaches to Stop Hypertension. The DASH eating plan is a healthy eating plan that has been shown to: Reduce high blood pressure (hypertension). Reduce your risk for type 2 diabetes, heart disease, and stroke. Help with weight loss. What are tips for following this plan? Reading food labels Check food labels for the amount of salt (sodium) per serving. Choose foods with less than 5 percent of the Daily Value of sodium. Generally, foods with less than 300 milligrams (mg) of sodium per serving fit into this eating plan. To find whole grains, look for the word "whole" as the first word in the ingredient list. Shopping Buy products labeled as "low-sodium" or "no salt added." Buy fresh foods. Avoid canned foods and pre-made or frozen meals. Cooking Avoid adding salt when cooking. Use salt-free seasonings or herbs instead of table salt or sea salt. Check with your health care provider or pharmacist before using salt substitutes. Do not fry foods. Cook foods using healthy methods such as baking, boiling, grilling, roasting, and broiling instead. Cook with heart-healthy oils, such as olive, canola, avocado, soybean, or sunflower oil. Meal planning  Eat a balanced diet that includes: 4 or more servings of fruits and 4 or more servings of vegetables each day. Try to fill one-half of your plate with fruits and vegetables. 6-8 servings of whole grains each day. Less than 6 oz (170 g) of lean meat, poultry, or fish each day. A 3-oz (85-g) serving of meat is about the same size as a deck of cards. One egg equals 1 oz (28 g). 2-3 servings of low-fat dairy each day. One serving is 1 cup (237 mL). 1 serving of nuts, seeds, or beans 5 times each week. 2-3 servings of heart-healthy fats. Healthy fats called omega-3 fatty acids are found in foods such as walnuts, flaxseeds, fortified milks, and eggs. These fats are also found in cold-water fish, such as sardines, salmon,  and mackerel. Limit how much you eat of: Canned or prepackaged foods. Food that is high in trans fat, such as some fried foods. Food that is high in saturated fat, such as fatty meat. Desserts and other sweets, sugary drinks, and other foods with added sugar. Full-fat dairy products. Do not salt foods before eating. Do not eat more than 4 egg yolks a week. Try to eat at least 2 vegetarian meals a week. Eat more home-cooked food and less restaurant, buffet, and fast food. Lifestyle When eating at a restaurant, ask that your food be prepared with less salt or no salt, if possible. If you drink alcohol: Limit how much you use to: 0-1 drink a day for women who are not pregnant. 0-2 drinks a day for men. Be aware of how much alcohol is in your drink. In the [place] one drink equals one 12 oz bottle of beer (355 mL), one 5 oz glass of wine (148 mL), or one 1 oz glass of hard liquor (44 mL). General information Avoid eating more than 2,300 mg of salt a day. If you have hypertension, you may need to reduce your sodium intake to 1,500 mg a day. Work with your health care provider to maintain a healthy body weight or to lose weight. Ask what an ideal weight is for you. Get at least 30 minutes of exercise that causes your heart to beat faster (aerobic exercise) most days of the week. Activities may include walking, swimming, or biking. Work with your health care provider or dietitian to   adjust your eating plan to your individual calorie needs. What foods should I eat? Fruits All fresh, dried, or frozen fruit. Canned fruit in natural juice (without added sugar). Vegetables Fresh or frozen vegetables (raw, steamed, roasted, or grilled). Low-sodium or reduced-sodium tomato and vegetable juice. Low-sodium or reduced-sodium tomato sauce and tomato paste. Low-sodium or reduced-sodium canned vegetables. Grains Whole-grain or whole-wheat bread. Whole-grain or whole-wheat pasta. Brown rice. Oatmeal. Quinoa.  Bulgur. Whole-grain and low-sodium cereals. Pita bread. Low-fat, low-sodium crackers. Whole-wheat flour tortillas. Meats and other proteins Skinless chicken or turkey. Ground chicken or turkey. Pork with fat trimmed off. Fish and seafood. Egg whites. Dried beans, peas, or lentils. Unsalted nuts, nut butters, and seeds. Unsalted canned beans. Lean cuts of beef with fat trimmed off. Low-sodium, lean precooked or cured meat, such as sausages or meat loaves. Dairy Low-fat (1%) or fat-free (skim) milk. Reduced-fat, low-fat, or fat-free cheeses. Nonfat, low-sodium ricotta or cottage cheese. Low-fat or nonfat yogurt. Low-fat, low-sodium cheese. Fats and oils Soft margarine without trans fats. Vegetable oil. Reduced-fat, low-fat, or light mayonnaise and salad dressings (reduced-sodium). Canola, safflower, olive, avocado, soybean, and sunflower oils. Avocado. Seasonings and condiments Herbs. Spices. Seasoning mixes without salt. Other foods Unsalted popcorn and pretzels. Fat-free sweets. The items listed above may not be a complete list of foods and beverages you can eat. Contact a dietitian for more information. What foods should I avoid? Fruits Canned fruit in a light or heavy syrup. Fried fruit. Fruit in cream or butter sauce. Vegetables Creamed or fried vegetables. Vegetables in a cheese sauce. Regular canned vegetables (not low-sodium or reduced-sodium). Regular canned tomato sauce and paste (not low-sodium or reduced-sodium). Regular tomato and vegetable juice (not low-sodium or reduced-sodium). Pickles. Olives. Grains Baked goods made with fat, such as croissants, muffins, or some breads. Dry pasta or rice meal packs. Meats and other proteins Fatty cuts of meat. Ribs. Fried meat. Bacon. Bologna, salami, and other precooked or cured meats, such as sausages or meat loaves. Fat from the back of a pig (fatback). Bratwurst. Salted nuts and seeds. Canned beans with added salt. Canned or smoked fish.  Whole eggs or egg yolks. Chicken or turkey with skin. Dairy Whole or 2% milk, cream, and half-and-half. Whole or full-fat cream cheese. Whole-fat or sweetened yogurt. Full-fat cheese. Nondairy creamers. Whipped toppings. Processed cheese and cheese spreads. Fats and oils Butter. Stick margarine. Lard. Shortening. Ghee. Bacon fat. Tropical oils, such as coconut, palm kernel, or palm oil. Seasonings and condiments Onion salt, garlic salt, seasoned salt, table salt, and sea salt. Worcestershire sauce. Tartar sauce. Barbecue sauce. Teriyaki sauce. Soy sauce, including reduced-sodium. Steak sauce. Canned and packaged gravies. Fish sauce. Oyster sauce. Cocktail sauce. Store-bought horseradish. Ketchup. Mustard. Meat flavorings and tenderizers. Bouillon cubes. Hot sauces. Pre-made or packaged marinades. Pre-made or packaged taco seasonings. Relishes. Regular salad dressings. Other foods Salted popcorn and pretzels. The items listed above may not be a complete list of foods and beverages you should avoid. Contact a dietitian for more information. Where to find more information National Heart, Lung, and Blood Institute: www.nhlbi.nih.gov American Heart Association: www.heart.org Academy of Nutrition and Dietetics: www.eatright.org National Kidney Foundation: www.kidney.org Summary The DASH eating plan is a healthy eating plan that has been shown to reduce high blood pressure (hypertension). It may also reduce your risk for type 2 diabetes, heart disease, and stroke. When on the DASH eating plan, aim to eat more fresh fruits and vegetables, whole grains, lean proteins, low-fat dairy, and heart-healthy fats. With the DASH   eating plan, you should limit salt (sodium) intake to 2,300 mg a day. If you have hypertension, you may need to reduce your sodium intake to 1,500 mg a day. Work with your health care provider or dietitian to adjust your eating plan to your individual calorie needs. This information is not  intended to replace advice given to you by your health care provider. Make sure you discuss any questions you have with your health care provider. Document Revised: 05/27/2019 Document Reviewed: 05/27/2019 Elsevier Patient Education  2022 [place] for Diabetes Mellitus, Adult Carbohydrate counting is a method of keeping track of how many carbohydrates you eat. Eating carbohydrates increases the amount of sugar (glucose) in the blood. Counting how many carbohydrates you eat improves how well you manage your blood glucose. This, in turn, helps you manage your diabetes. Carbohydrates are measured in grams (g) per serving. It is important to know how many carbohydrates (in grams or by serving size) you can have in each meal. This is different for every person. A dietitian can help you make a meal plan and calculate how many carbohydrates you should have at each meal and snack. What foods contain carbohydrates? Carbohydrates are found in the following foods: Grains, such as breads and cereals. Dried beans and soy products. Starchy vegetables, such as potatoes, peas, and corn. Fruit and fruit juices. Milk and yogurt. Sweets and snack foods, such as cake, cookies, candy, chips, and soft drinks. How do I count carbohydrates in foods? There are two ways to count carbohydrates in food. You can read food labels or learn standard serving sizes of foods. You can use either of these methods or a combination of both. Using the Nutrition Facts label The Nutrition Facts list is included on the labels of almost all packaged foods and beverages in the [place]. It includes: The serving size. Information about nutrients in each serving, including the grams of carbohydrate per serving. To use the Nutrition Facts, decide how many servings you will have. Then, multiply the number of servings by the number of carbohydrates per serving. The resulting number is the total grams of  carbohydrates that you will be having. Learning the standard serving sizes of foods When you eat carbohydrate foods that are not packaged or do not include Nutrition Facts on the label, you need to measure the servings in order to count the grams of carbohydrates. Measure the foods that you will eat with a food scale or measuring cup, if needed. Decide how many standard-size servings you will eat. Multiply the number of servings by 15. For foods that contain carbohydrates, one serving equals 15 g of carbohydrates. For example, if you eat 2 cups or 10 oz (300 g) of strawberries, you will have eaten 2 servings and 30 g of carbohydrates (2 servings x 15 g = 30 g). For foods that have more than one food mixed, such as soups and casseroles, you must count the carbohydrates in each food that is included. The following list contains standard serving sizes of common carbohydrate-rich foods. Each of these servings has about 15 g of carbohydrates: 1 slice of bread. 1 six-inch (15 cm) tortilla. ? cup or 2 oz (53 g) cooked rice or pasta.  cup or 3 oz (85 g) cooked or canned, drained and rinsed beans or lentils.  cup or 3 oz (85 g) starchy vegetable, such as peas, corn, or squash.  cup or 4 oz (120 g) hot cereal.  cup or 3 oz (85  g) boiled or mashed potatoes, or  or 3 oz (85 g) of a large baked potato.  cup or 4 fl oz (118 mL) fruit juice. 1 cup or 8 fl oz (237 mL) milk. 1 small or 4 oz (106 g) apple.  or 2 oz (63 g) of a medium banana. 1 cup or 5 oz (150 g) strawberries. 3 cups or 1 oz (28.3 g) popped popcorn. What is an example of carbohydrate counting? To calculate the grams of carbohydrates in this sample meal, follow the steps shown below. Sample meal 3 oz (85 g) chicken breast. ? cup or 4 oz (106 g) brown rice.  cup or 3 oz (85 g) corn. 1 cup or 8 fl oz (237 mL) milk. 1 cup or 5 oz (150 g) strawberries with sugar-free whipped topping. Carbohydrate calculation Identify the foods that  contain carbohydrates: Rice. Corn. Milk. Strawberries. Calculate how many servings you have of each food: 2 servings rice. 1 serving corn. 1 serving milk. 1 serving strawberries. Multiply each number of servings by 15 g: 2 servings rice x 15 g = 30 g. 1 serving corn x 15 g = 15 g. 1 serving milk x 15 g = 15 g. 1 serving strawberries x 15 g = 15 g. Add together all of the amounts to find the total grams of carbohydrates eaten: 30 g + 15 g + 15 g + 15 g = 75 g of carbohydrates total. What are tips for following this plan? Shopping Develop a meal plan and then make a shopping list. Buy fresh and frozen vegetables, fresh and frozen fruit, dairy, eggs, beans, lentils, and whole grains. Look at food labels. Choose foods that have more fiber and less sugar. Avoid processed foods and foods with added sugars. Meal planning Aim to have the same number of grams of carbohydrates at each meal and for each snack time. Plan to have regular, balanced meals and snacks. Where to find more information American Diabetes Association: diabetes.org Centers for Disease Control and Prevention: [employer].[place] Academy of Nutrition and Dietetics: eatright.org Association of Diabetes Care & Education Specialists: diabeteseducator.org Summary Carbohydrate counting is a method of keeping track of how many carbohydrates you eat. Eating carbohydrates increases the amount of sugar (glucose) in your blood. Counting how many carbohydrates you eat improves how well you manage your blood glucose. This helps you manage your diabetes. A dietitian can help you make a meal plan and calculate how many carbohydrates you should have at each meal and snack. This information is not intended to replace advice given to you by your health care provider. Make sure you discuss any questions you have with your health care provider. Document Revised: 01/25/2020 Document Reviewed: 01/25/2020 Elsevier Patient Education  [place].

## 2021-06-14 ENCOUNTER — Other Ambulatory Visit: Payer: Self-pay

## 2021-06-14 LAB — COMPREHENSIVE METABOLIC PANEL
ALT: 21 IU/L (ref 0–32)
AST: 13 IU/L (ref 0–40)
Albumin/Globulin Ratio: 1.7 (ref 1.2–2.2)
Albumin: 4.5 g/dL (ref 3.8–4.9)
Alkaline Phosphatase: 146 IU/L — ABNORMAL HIGH (ref 44–121)
BUN/Creatinine Ratio: 15 (ref 9–23)
BUN: 12 mg/dL (ref 6–24)
Bilirubin Total: 0.3 mg/dL (ref 0.0–1.2)
CO2: 26 mmol/L (ref 20–29)
Calcium: 10 mg/dL (ref 8.7–10.2)
Chloride: 98 mmol/L (ref 96–106)
Creatinine, Ser: 0.78 mg/dL (ref 0.57–1.00)
Globulin, Total: 2.7 g/dL (ref 1.5–4.5)
Glucose: 459 mg/dL — ABNORMAL HIGH (ref 70–99)
Potassium: 3.9 mmol/L (ref 3.5–5.2)
Sodium: 139 mmol/L (ref 134–144)
Total Protein: 7.2 g/dL (ref 6.0–8.5)
eGFR: 90 mL/min/{1.73_m2} (ref >59)

## 2021-06-17 ENCOUNTER — Other Ambulatory Visit: Payer: Self-pay

## 2021-06-18 ENCOUNTER — Telehealth: Payer: Self-pay | Admitting: Licensed Clinical Social Worker

## 2021-06-18 NOTE — Telephone Encounter (Signed)
Patient was referred to [facility] for behavioral health services and will be seen at [facility] for her initial assessment in-person on 07/11/21. The patient confirmed she will establish care with [facility].

## 2021-06-19 ENCOUNTER — Other Ambulatory Visit: Payer: Self-pay

## 2021-06-19 ENCOUNTER — Ambulatory Visit: Payer: Self-pay | Admitting: Gerontology

## 2021-06-19 ENCOUNTER — Encounter: Payer: Self-pay | Admitting: Gerontology

## 2021-06-19 VITALS — BP 167/111 | HR 71 | Temp 98.1°F | Resp 16 | Ht 65.0 in | Wt 190.3 lb

## 2021-06-19 DIAGNOSIS — E119 Type 2 diabetes mellitus without complications: Secondary | ICD-10-CM

## 2021-06-19 DIAGNOSIS — Z794 Long term (current) use of insulin: Secondary | ICD-10-CM

## 2021-06-19 DIAGNOSIS — I10 Essential (primary) hypertension: Secondary | ICD-10-CM

## 2021-06-19 MED ORDER — RIGHTEST GL300 LANCETS MISC
0 refills | Status: AC
  Filled 2021-06-19: qty 100, 25d supply, fill #0

## 2021-06-19 MED ORDER — METFORMIN HCL ER 500 MG PO TB24
500.0000 mg | ORAL_TABLET | Freq: Every day | ORAL | 0 refills | Status: DC
  Filled 2021-06-19: qty 30, 30d supply, fill #0

## 2021-06-19 MED ORDER — BLOOD GLUCOSE MONITOR KIT
PACK | 0 refills | Status: AC
  Filled 2021-06-19: qty 1, 30d supply, fill #0

## 2021-06-19 MED ORDER — HYDRALAZINE HCL 50 MG PO TABS
50.0000 mg | ORAL_TABLET | Freq: Three times a day (TID) | ORAL | 0 refills | Status: DC
  Filled 2021-06-19 – 2021-07-18 (×2): qty 30, 10d supply, fill #0

## 2021-06-19 MED ORDER — RIGHTEST GS550 BLOOD GLUCOSE VI STRP
ORAL_STRIP | 0 refills | Status: DC
  Filled 2021-06-19: qty 100, 25d supply, fill #0

## 2021-06-19 NOTE — Patient Instructions (Signed)
DASH Eating Plan °DASH stands for Dietary Approaches to Stop Hypertension. The DASH eating plan is a healthy eating plan that has been shown to: °Reduce high blood pressure (hypertension). °Reduce your risk for type 2 diabetes, heart disease, and stroke. °Help with weight loss. °What are tips for following this plan? °Reading food labels °Check food labels for the amount of salt (sodium) per serving. Choose foods with less than 5 percent of the Daily Value of sodium. Generally, foods with less than 300 milligrams (mg) of sodium per serving fit into this eating plan. °To find whole grains, look for the word "whole" as the first word in the ingredient list. °Shopping °Buy products labeled as "low-sodium" or "no salt added." °Buy fresh foods. Avoid canned foods and pre-made or frozen meals. °Cooking °Avoid adding salt when cooking. Use salt-free seasonings or herbs instead of table salt or sea salt. Check with your health care provider or pharmacist before using salt substitutes. °Do not fry foods. Cook foods using healthy methods such as baking, boiling, grilling, roasting, and broiling instead. °Cook with heart-healthy oils, such as olive, canola, avocado, soybean, or sunflower oil. °Meal planning ° °Eat a balanced diet that includes: °4 or more servings of fruits and 4 or more servings of vegetables each day. Try to fill one-half of your plate with fruits and vegetables. °6-8 servings of whole grains each day. °Less than 6 oz (170 g) of lean meat, poultry, or fish each day. A 3-oz (85-g) serving of meat is about the same size as a deck of cards. One egg equals 1 oz (28 g). °2-3 servings of low-fat dairy each day. One serving is 1 cup (237 mL). °1 serving of nuts, seeds, or beans 5 times each week. °2-3 servings of heart-healthy fats. Healthy fats called omega-3 fatty acids are found in foods such as walnuts, flaxseeds, fortified milks, and eggs. These fats are also found in cold-water fish, such as sardines, salmon,  and mackerel. °Limit how much you eat of: °Canned or prepackaged foods. °Food that is high in trans fat, such as some fried foods. °Food that is high in saturated fat, such as fatty meat. °Desserts and other sweets, sugary drinks, and other foods with added sugar. °Full-fat dairy products. °Do not salt foods before eating. °Do not eat more than 4 egg yolks a week. °Try to eat at least 2 vegetarian meals a week. °Eat more home-cooked food and less restaurant, buffet, and fast food. °Lifestyle °When eating at a restaurant, ask that your food be prepared with less salt or no salt, if possible. °If you drink alcohol: °Limit how much you use to: °0-1 drink a day for women who are not pregnant. °0-2 drinks a day for men. °Be aware of how much alcohol is in your drink. In the [place] one drink equals one 12 oz bottle of beer (355 mL), one 5 oz glass of wine (148 mL), or one 1½ oz glass of hard liquor (44 mL). °General information °Avoid eating more than 2,300 mg of salt a day. If you have hypertension, you may need to reduce your sodium intake to 1,500 mg a day. °Work with your health care provider to maintain a healthy body weight or to lose weight. Ask what an ideal weight is for you. °Get at least 30 minutes of exercise that causes your heart to beat faster (aerobic exercise) most days of the week. Activities may include walking, swimming, or biking. °Work with your health care provider or dietitian to   adjust your eating plan to your individual calorie needs. °What foods should I eat? °Fruits °All fresh, dried, or frozen fruit. Canned fruit in natural juice (without added sugar). °Vegetables °Fresh or frozen vegetables (raw, steamed, roasted, or grilled). Low-sodium or reduced-sodium tomato and vegetable juice. Low-sodium or reduced-sodium tomato sauce and tomato paste. Low-sodium or reduced-sodium canned vegetables. °Grains °Whole-grain or whole-wheat bread. Whole-grain or whole-wheat pasta. Brown rice. Oatmeal. Quinoa.  Bulgur. Whole-grain and low-sodium cereals. Pita bread. Low-fat, low-sodium crackers. Whole-wheat flour tortillas. °Meats and other proteins °Skinless chicken or turkey. Ground chicken or turkey. Pork with fat trimmed off. Fish and seafood. Egg whites. Dried beans, peas, or lentils. Unsalted nuts, nut butters, and seeds. Unsalted canned beans. Lean cuts of beef with fat trimmed off. Low-sodium, lean precooked or cured meat, such as sausages or meat loaves. °Dairy °Low-fat (1%) or fat-free (skim) milk. Reduced-fat, low-fat, or fat-free cheeses. Nonfat, low-sodium ricotta or cottage cheese. Low-fat or nonfat yogurt. Low-fat, low-sodium cheese. °Fats and oils °Soft margarine without trans fats. Vegetable oil. Reduced-fat, low-fat, or light mayonnaise and salad dressings (reduced-sodium). Canola, safflower, olive, avocado, soybean, and sunflower oils. Avocado. °Seasonings and condiments °Herbs. Spices. Seasoning mixes without salt. °Other foods °Unsalted popcorn and pretzels. Fat-free sweets. °The items listed above may not be a complete list of foods and beverages you can eat. Contact a dietitian for more information. °What foods should I avoid? °Fruits °Canned fruit in a light or heavy syrup. Fried fruit. Fruit in cream or butter sauce. °Vegetables °Creamed or fried vegetables. Vegetables in a cheese sauce. Regular canned vegetables (not low-sodium or reduced-sodium). Regular canned tomato sauce and paste (not low-sodium or reduced-sodium). Regular tomato and vegetable juice (not low-sodium or reduced-sodium). Pickles. Olives. °Grains °Baked goods made with fat, such as croissants, muffins, or some breads. Dry pasta or rice meal packs. °Meats and other proteins °Fatty cuts of meat. Ribs. Fried meat. Bacon. Bologna, salami, and other precooked or cured meats, such as sausages or meat loaves. Fat from the back of a pig (fatback). Bratwurst. Salted nuts and seeds. Canned beans with added salt. Canned or smoked fish.  Whole eggs or egg yolks. Chicken or turkey with skin. °Dairy °Whole or 2% milk, cream, and half-and-half. Whole or full-fat cream cheese. Whole-fat or sweetened yogurt. Full-fat cheese. Nondairy creamers. Whipped toppings. Processed cheese and cheese spreads. °Fats and oils °Butter. Stick margarine. Lard. Shortening. Ghee. Bacon fat. Tropical oils, such as coconut, palm kernel, or palm oil. °Seasonings and condiments °Onion salt, garlic salt, seasoned salt, table salt, and sea salt. Worcestershire sauce. Tartar sauce. Barbecue sauce. Teriyaki sauce. Soy sauce, including reduced-sodium. Steak sauce. Canned and packaged gravies. Fish sauce. Oyster sauce. Cocktail sauce. Store-bought horseradish. Ketchup. Mustard. Meat flavorings and tenderizers. Bouillon cubes. Hot sauces. Pre-made or packaged marinades. Pre-made or packaged taco seasonings. Relishes. Regular salad dressings. °Other foods °Salted popcorn and pretzels. °The items listed above may not be a complete list of foods and beverages you should avoid. Contact a dietitian for more information. °Where to find more information °National Heart, Lung, and Blood Institute: www.nhlbi.nih.gov °American Heart Association: www.heart.org °Academy of Nutrition and Dietetics: www.eatright.org °National Kidney Foundation: www.kidney.org °Summary °The DASH eating plan is a healthy eating plan that has been shown to reduce high blood pressure (hypertension). It may also reduce your risk for type 2 diabetes, heart disease, and stroke. °When on the DASH eating plan, aim to eat more fresh fruits and vegetables, whole grains, lean proteins, low-fat dairy, and heart-healthy fats. °With the DASH   eating plan, you should limit salt (sodium) intake to 2,300 mg a day. If you have hypertension, you may need to reduce your sodium intake to 1,500 mg a day. Work with your health care provider or dietitian to adjust your eating plan to your individual calorie needs. This information is not  intended to replace advice given to you by your health care provider. Make sure you discuss any questions you have with your health care provider. Document Revised: 05/27/2019 Document Reviewed: 05/27/2019 Elsevier Patient Education  2022 [place] for Diabetes Mellitus, Adult Carbohydrate counting is a method of keeping track of how many carbohydrates you eat. Eating carbohydrates increases the amount of sugar (glucose) in the blood. Counting how many carbohydrates you eat improves how well you manage your blood glucose. This, in turn, helps you manage your diabetes. Carbohydrates are measured in grams (g) per serving. It is important to know how many carbohydrates (in grams or by serving size) you can have in each meal. This is different for every person. A dietitian can help you make a meal plan and calculate how many carbohydrates you should have at each meal and snack. What foods contain carbohydrates? Carbohydrates are found in the following foods: Grains, such as breads and cereals. Dried beans and soy products. Starchy vegetables, such as potatoes, peas, and corn. Fruit and fruit juices. Milk and yogurt. Sweets and snack foods, such as cake, cookies, candy, chips, and soft drinks. How do I count carbohydrates in foods? There are two ways to count carbohydrates in food. You can read food labels or learn standard serving sizes of foods. You can use either of these methods or a combination of both. Using the Nutrition Facts label The Nutrition Facts list is included on the labels of almost all packaged foods and beverages in the [place]. It includes: The serving size. Information about nutrients in each serving, including the grams of carbohydrate per serving. To use the Nutrition Facts, decide how many servings you will have. Then, multiply the number of servings by the number of carbohydrates per serving. The resulting number is the total grams of  carbohydrates that you will be having. Learning the standard serving sizes of foods When you eat carbohydrate foods that are not packaged or do not include Nutrition Facts on the label, you need to measure the servings in order to count the grams of carbohydrates. Measure the foods that you will eat with a food scale or measuring cup, if needed. Decide how many standard-size servings you will eat. Multiply the number of servings by 15. For foods that contain carbohydrates, one serving equals 15 g of carbohydrates. For example, if you eat 2 cups or 10 oz (300 g) of strawberries, you will have eaten 2 servings and 30 g of carbohydrates (2 servings x 15 g = 30 g). For foods that have more than one food mixed, such as soups and casseroles, you must count the carbohydrates in each food that is included. The following list contains standard serving sizes of common carbohydrate-rich foods. Each of these servings has about 15 g of carbohydrates: 1 slice of bread. 1 six-inch (15 cm) tortilla. ? cup or 2 oz (53 g) cooked rice or pasta.  cup or 3 oz (85 g) cooked or canned, drained and rinsed beans or lentils.  cup or 3 oz (85 g) starchy vegetable, such as peas, corn, or squash.  cup or 4 oz (120 g) hot cereal.  cup or 3 oz (85  g) boiled or mashed potatoes, or  or 3 oz (85 g) of a large baked potato.  cup or 4 fl oz (118 mL) fruit juice. 1 cup or 8 fl oz (237 mL) milk. 1 small or 4 oz (106 g) apple.  or 2 oz (63 g) of a medium banana. 1 cup or 5 oz (150 g) strawberries. 3 cups or 1 oz (28.3 g) popped popcorn. What is an example of carbohydrate counting? To calculate the grams of carbohydrates in this sample meal, follow the steps shown below. Sample meal 3 oz (85 g) chicken breast. ? cup or 4 oz (106 g) brown rice.  cup or 3 oz (85 g) corn. 1 cup or 8 fl oz (237 mL) milk. 1 cup or 5 oz (150 g) strawberries with sugar-free whipped topping. Carbohydrate calculation Identify the foods that  contain carbohydrates: Rice. Corn. Milk. Strawberries. Calculate how many servings you have of each food: 2 servings rice. 1 serving corn. 1 serving milk. 1 serving strawberries. Multiply each number of servings by 15 g: 2 servings rice x 15 g = 30 g. 1 serving corn x 15 g = 15 g. 1 serving milk x 15 g = 15 g. 1 serving strawberries x 15 g = 15 g. Add together all of the amounts to find the total grams of carbohydrates eaten: 30 g + 15 g + 15 g + 15 g = 75 g of carbohydrates total. What are tips for following this plan? Shopping Develop a meal plan and then make a shopping list. Buy fresh and frozen vegetables, fresh and frozen fruit, dairy, eggs, beans, lentils, and whole grains. Look at food labels. Choose foods that have more fiber and less sugar. Avoid processed foods and foods with added sugars. Meal planning Aim to have the same number of grams of carbohydrates at each meal and for each snack time. Plan to have regular, balanced meals and snacks. Where to find more information American Diabetes Association: diabetes.org Centers for Disease Control and Prevention: StoreMirror.com.[place] Academy of Nutrition and Dietetics: eatright.org Association of Diabetes Care & Education Specialists: diabeteseducator.org Summary Carbohydrate counting is a method of keeping track of how many carbohydrates you eat. Eating carbohydrates increases the amount of sugar (glucose) in your blood. Counting how many carbohydrates you eat improves how well you manage your blood glucose. This helps you manage your diabetes. A dietitian can help you make a meal plan and calculate how many carbohydrates you should have at each meal and snack. This information is not intended to replace advice given to you by your health care provider. Make sure you discuss any questions you have with your health care provider. Document Revised: 01/25/2020 Document Reviewed: 01/25/2020 Elsevier Patient Education  [place].

## 2021-06-19 NOTE — Progress Notes (Signed)
Established Patient Office Visit  Subjective:  Patient ID: Kerry Webb, female    DOB: 1965/07/29  Age: 55 y.o. MRN: [id number]  CC:  Chief Complaint  Patient presents with   Follow-up    Labs drawn 06/13/21    HPI Kerry Webb is a 55 year old female who has history of type 2 diabetes, GERD, heart murmur, hypertension, seizure presents for follow up of hypertension and Type 2 diabetes mellitus. Her HgbA1c done on 04/29/21 ws 9.1%, she states that she is compliant with her medication but is difficult to adhere to ADA diet because of limited resources.  She checks her blood glucose twice daily and her 5-day reading is as follows,  fasting readings are  375, 309, 289, 275 and  211 mg/dl. Her  5 day evening readings are 375, 309, 289, 275 and 322 mg/dl. She states that she was taking Metoprolol 75 mg daily instead of bid and hydralazine 50 mg daily. Per her blood pressure log, her SBP ranges between 169-200 and DBP 103- 125.  She states that she will follow-up with nephrology at [facility] tomorrow for her uncontrolled hypertension.  She denies any side effects and states that she is doing well and offers no further complaint.  Past Medical History:  Diagnosis Date   Diabetes mellitus without complication ([place])    GERD (gastroesophageal reflux disease)    Heart murmur 1967   Hypertension 1993   Lump or mass in breast    Left   Seizures ([place]) 1983   Ulcer 1985    Past Surgical History:  Procedure Laterality Date   ABDOMINAL HYSTERECTOMY  2004   APPENDECTOMY  2014   BREAST BIOPSY Right 01-23-14   benign   BREAST SURGERY Left 01/14/12   BREAST SURGERY Right 05-16-14   benign   CHOLECYSTECTOMY N/A 05/18/2019   Procedure: LAPAROSCOPIC CHOLECYSTECTOMY;  Surgeon: Benjamine Sprague, DO;  Location: [facility] ORS;  Service: General;  Laterality: N/A;   COLONOSCOPY  2013   Dr. Candace Cruise    Family History  Problem Relation Age of Onset   Liver cancer Mother    Cancer Mother         liver   Thyroid disease Mother    Kidney disease Father    Alcohol abuse Father    Other Brother        auto accident   Diabetes Maternal Aunt    Breast cancer Maternal Aunt    Cancer Maternal Grandmother        breast   Hypertension Paternal Grandmother    Kidney disease Paternal Grandfather    Hypertension Paternal Grandfather     Social History   Socioeconomic History   Marital status: Married    Spouse name: Not on file   Number of children: Not on file   Years of education: Not on file   Highest education level: Not on file  Occupational History   Not on file  Tobacco Use   Smoking status: Never   Smokeless tobacco: Never  Vaping Use   Vaping Use: Never used  Substance and Sexual Activity   Alcohol use: Not Currently    Alcohol/week: 1.0 standard drink    Types: 1 Glasses of wine per week    Comment: last use 03/2021   Drug use: Never   Sexual activity: Not on file  Other Topics Concern   Not on file  Social History Narrative   Not on file   Social Determinants of Health  Financial Resource Strain: Not on file  Food Insecurity: Food Insecurity Present   Worried About Charity fundraiser in the Last Year: Sometimes true   Arboriculturist in the Last Year: Sometimes true  Transportation Needs: No Transportation Needs   Lack of Transportation (Medical): No   Lack of Transportation (Non-Medical): No  Physical Activity: Not on file  Stress: Not on file  Social Connections: Not on file  Intimate Partner Violence: Not on file    Outpatient Medications Prior to Visit  Medication Sig Dispense Refill   atorvastatin (LIPITOR) 40 MG tablet Take 1 tablet (40 mg total) by mouth once daily. 90 tablet 0   dapagliflozin propanediol (FARXIGA) 10 MG TABS tablet Take 1 tablet (10 mg total) by mouth once every morning. 90 tablet 0   Insulin Glargine (BASAGLAR KWIKPEN) 100 UNIT/ML Inject 30 Units into the skin once daily at bedtime. (Replaces Lantus) 15 mL 5    Insulin Pen Needle 32G X 4 MM MISC Use as directed with insulin pen 100 each 0   levETIRAcetam (KEPPRA) 500 MG tablet Take 1 tablet (500 mg total) by mouth 2 (two) times daily. 60 tablet 0   losartan (COZAAR) 100 MG tablet Take 1 tablet (100 mg total) by mouth once daily. 90 tablet 0   metoprolol tartrate (LOPRESSOR) 50 MG tablet Take 1 and (1/2) tablet (75 mg total) by mouth 2 (two) times daily. 270 tablet 0   sertraline (ZOLOFT) 100 MG tablet Take 1 tablet (100 mg total) by mouth once daily. 90 tablet 0   hydrALAZINE (APRESOLINE) 50 MG tablet Take 1 and (1/2) tablet (75 mg total) by mouth daily at 2 PM. 60 tablet 0   No facility-administered medications prior to visit.    Allergies  Allergen Reactions   Cashew Nut Oil Shortness Of Breath   Shellfish Allergy Shortness Of Breath   Asa [Aspirin]     Intestinal bleeding    ROS Review of Systems  Constitutional: Negative.   Eyes: Negative.   Respiratory: Negative.    Cardiovascular: Negative.   Endocrine: Positive for polyuria.  Skin: Negative.   Neurological: Negative.   Psychiatric/Behavioral: Negative.       Objective:    Physical Exam HENT:     Head: Normocephalic and atraumatic.     Mouth/Throat:     Mouth: Mucous membranes are moist.  Eyes:     Extraocular Movements: Extraocular movements intact.     Conjunctiva/sclera: Conjunctivae normal.     Pupils: Pupils are equal, round, and reactive to light.  Cardiovascular:     Rate and Rhythm: Normal rate and regular rhythm.     Pulses: Normal pulses.     Heart sounds: Normal heart sounds.  Pulmonary:     Effort: Pulmonary effort is normal.     Breath sounds: Normal breath sounds.  Neurological:     General: No focal deficit present.     Mental Status: She is alert and oriented to person, place, and time. Mental status is at baseline.  Psychiatric:        Mood and Affect: Mood normal.        Behavior: Behavior normal.        Thought Content: Thought content normal.         Judgment: Judgment normal.    BP (!) 167/111 (BP Location: Right Arm, Patient Position: Sitting, Cuff Size: Large)    Pulse 71    Temp 98.1 F (36.7 C) (Oral)  Resp 16    Ht 5' 5"  (1.651 m)    Wt 190 lb 4.8 oz (86.3 kg)    SpO2 98%    BMI 31.67 kg/m  Wt Readings from Last 3 Encounters:  06/19/21 190 lb 4.8 oz (86.3 kg)  06/13/21 187 lb 9.6 oz (85.1 kg)  05/06/21 187 lb 6.3 oz (85 kg)   Encouraged weight loss  Health Maintenance Due  Topic Date Due   Pneumococcal Vaccine 70-30 Years old (1 - PCV) Never done   Hepatitis C Screening  Never done   TETANUS/TDAP  Never done   PAP SMEAR-Modifier  Never done   COLONOSCOPY (Pts 45-43yr Insurance coverage will need to be confirmed)  Never done   Zoster Vaccines- Shingrix (1 of 2) Never done   MAMMOGRAM  01/30/2019   COVID-19 Vaccine (2 - Booster for Janssen series) 01/03/2020    There are no preventive care reminders to display for this patient.  No results found for: TSH Lab Results  Component Value Date   WBC 8.4 05/10/2021   HGB 15.1 (H) 05/10/2021   HCT 45.1 05/10/2021   MCV 88.8 05/10/2021   PLT 278 05/10/2021   Lab Results  Component Value Date   NA 139 06/13/2021   K 3.9 06/13/2021   CO2 26 06/13/2021   GLUCOSE 459 (H) 06/13/2021   BUN 12 06/13/2021   CREATININE 0.78 06/13/2021   BILITOT 0.3 06/13/2021   ALKPHOS 146 (H) 06/13/2021   AST 13 06/13/2021   ALT 21 06/13/2021   PROT 7.2 06/13/2021   ALBUMIN 4.5 06/13/2021   CALCIUM 10.0 06/13/2021   ANIONGAP 6 05/10/2021   EGFR 90 06/13/2021   No results found for: CHOL No results found for: HDL No results found for: LDLCALC No results found for: TRIG No results found for: CHOLHDL Lab Results  Component Value Date   HGBA1C 9.1 (H) 04/29/2021      Assessment & Plan:     1. Primary hypertension -Her blood pressure is still not controlled, she will continue on 50 mg hydralazine 3 times daily.  Her goal blood pressure should be less than 140/90.  She  was advised to check her blood pressure daily, record and bring the log to follow-up appointment.  She will continue on DASH diet and exercise as tolerated.  She was encouraged to [facility]  She was educated on CVA symptoms and advised to go to the emergency room. - hydrALAZINE (APRESOLINE) 50 MG tablet; Take 1 tablet (50 mg total) by mouth 3 (three) times daily.  Dispense: 30 tablet; Refill: 0  2. Type 2 diabetes mellitus without complication, with long-term current use of insulin (HCC) -Her hemoglobin A1c was 9.1% and her goal should be less than 7%.  She was started on metformin 500 mg daily, educated on medication side effects and advised to notify clinic.  She will continue on low-carb/no concentrated sweet diet and exercise as tolerated.  She was encouraged to check her blood glucose twice daily and have fasting goal should be between 80 and 130 mg per DL.  She - metFORMIN (GLUCOPHAGE-XR) 500 MG 24 hr tablet; Take 1 tablet (500 mg total) by mouth once daily with breakfast.  Dispense: 30 tablet; Refill: 0 - blood glucose meter kit and supplies KIT; USE AS DIRECTED TO CHECK BLOOD SUGAR UP TO 4 TIMES DAILY.  Dispense: 1 each; Refill: 0 - Lipid panel; Future - Lipid panel   Follow-up: Return in  about 3 weeks (around 07/10/2021), or if symptoms worsen or fail to improve.    Shalimar Mcclain Jerold Coombe, NP

## 2021-06-20 ENCOUNTER — Observation Stay
Admission: EM | Admit: 2021-06-20 | Discharge: 2021-06-22 | Disposition: A | Discharge status: Home / Self Care | Payer: Self-pay | Attending: Internal Medicine | Admitting: Internal Medicine

## 2021-06-20 ENCOUNTER — Other Ambulatory Visit: Payer: Self-pay

## 2021-06-20 ENCOUNTER — Observation Stay: Payer: Self-pay

## 2021-06-20 ENCOUNTER — Encounter: Payer: Self-pay | Admitting: Emergency Medicine

## 2021-06-20 DIAGNOSIS — Z79899 Other long term (current) drug therapy: Secondary | ICD-10-CM | POA: Insufficient documentation

## 2021-06-20 DIAGNOSIS — F4323 Adjustment disorder with mixed anxiety and depressed mood: Secondary | ICD-10-CM | POA: Diagnosis present

## 2021-06-20 DIAGNOSIS — I10 Essential (primary) hypertension: Secondary | ICD-10-CM | POA: Insufficient documentation

## 2021-06-20 DIAGNOSIS — R55 Syncope and collapse: Principal | ICD-10-CM | POA: Insufficient documentation

## 2021-06-20 DIAGNOSIS — Z87898 Personal history of other specified conditions: Secondary | ICD-10-CM

## 2021-06-20 DIAGNOSIS — Z20822 Contact with and (suspected) exposure to covid-19: Secondary | ICD-10-CM | POA: Insufficient documentation

## 2021-06-20 DIAGNOSIS — F32A Depression, unspecified: Secondary | ICD-10-CM

## 2021-06-20 DIAGNOSIS — Z7984 Long term (current) use of oral hypoglycemic drugs: Secondary | ICD-10-CM | POA: Insufficient documentation

## 2021-06-20 DIAGNOSIS — E119 Type 2 diabetes mellitus without complications: Secondary | ICD-10-CM | POA: Insufficient documentation

## 2021-06-20 DIAGNOSIS — E785 Hyperlipidemia, unspecified: Secondary | ICD-10-CM

## 2021-06-20 DIAGNOSIS — F419 Anxiety disorder, unspecified: Secondary | ICD-10-CM

## 2021-06-20 DIAGNOSIS — Z794 Long term (current) use of insulin: Secondary | ICD-10-CM | POA: Insufficient documentation

## 2021-06-20 LAB — BASIC METABOLIC PANEL
Anion gap: 7 (ref 5–15)
BUN: 13 mg/dL (ref 6–20)
CO2: 24 mmol/L (ref 22–32)
Calcium: 8.8 mg/dL — ABNORMAL LOW (ref 8.9–10.3)
Chloride: 106 mmol/L (ref 98–111)
Creatinine, Ser: 0.66 mg/dL (ref 0.44–1.00)
GFR, Estimated: >60 mL/min (ref >60)
Glucose, Bld: 349 mg/dL — ABNORMAL HIGH (ref 70–99)
Potassium: 3.8 mmol/L (ref 3.5–5.1)
Sodium: 137 mmol/L (ref 135–145)

## 2021-06-20 LAB — CBC
HCT: 44 % (ref 36.0–46.0)
Hemoglobin: 14.6 g/dL (ref 12.0–15.0)
MCH: 29.4 pg (ref 26.0–34.0)
MCHC: 33.2 g/dL (ref 30.0–36.0)
MCV: 88.7 fL (ref 80.0–100.0)
Platelets: 288 10*3/uL (ref 150–400)
RBC: 4.96 MIL/uL (ref 3.87–5.11)
RDW: 13.2 % (ref 11.5–15.5)
WBC: 7 10*3/uL (ref 4.0–10.5)
nRBC: 0 % (ref 0.0–0.2)

## 2021-06-20 LAB — TROPONIN I (HIGH SENSITIVITY): Troponin I (High Sensitivity): 7 ng/L (ref <18)

## 2021-06-20 LAB — RESP PANEL BY RT-PCR (FLU A&B, COVID) ARPGX2
Influenza A by PCR: NEGATIVE
Influenza B by PCR: NEGATIVE
SARS Coronavirus 2 by RT PCR: NEGATIVE

## 2021-06-20 LAB — LIPID PANEL
Chol/HDL Ratio: 7.5 ratio — ABNORMAL HIGH (ref 0.0–4.4)
Cholesterol, Total: 256 mg/dL — ABNORMAL HIGH (ref 100–199)
HDL: 34 mg/dL — ABNORMAL LOW (ref >39)
LDL Chol Calc (NIH): 160 mg/dL — ABNORMAL HIGH (ref 0–99)
Triglycerides: 325 mg/dL — ABNORMAL HIGH (ref 0–149)
VLDL Cholesterol Cal: 62 mg/dL — ABNORMAL HIGH (ref 5–40)

## 2021-06-20 LAB — CBG MONITORING, ED
Glucose-Capillary: 320 mg/dL — ABNORMAL HIGH (ref 70–99)
Glucose-Capillary: 141 mg/dL — ABNORMAL HIGH (ref 70–99)

## 2021-06-20 LAB — GLUCOSE, CAPILLARY: Glucose-Capillary: 170 mg/dL — ABNORMAL HIGH (ref 70–99)

## 2021-06-20 MED ORDER — LORAZEPAM 1 MG PO TABS: 1.0000 mg | ORAL_TABLET | Freq: Every day | ORAL | 0 refills | Status: DC | PRN

## 2021-06-20 MED ORDER — ATORVASTATIN CALCIUM 20 MG PO TABS
40.0000 mg | ORAL_TABLET | Freq: Every day | ORAL | Status: DC
  Administered 2021-06-21 – 2021-06-22 (×2): 40 mg via ORAL
  Filled 2021-06-20 (×2): qty 2

## 2021-06-20 MED ORDER — ONDANSETRON HCL 4 MG/2ML IJ SOLN: 4.0000 mg | Freq: Three times a day (TID) | INTRAMUSCULAR | Status: DC | PRN

## 2021-06-20 MED ORDER — INSULIN ASPART 100 UNIT/ML IJ SOLN
0.0000 [IU] | Freq: Three times a day (TID) | INTRAMUSCULAR | Status: DC
  Administered 2021-06-21: 2 [IU] via SUBCUTANEOUS
  Administered 2021-06-21: 1 [IU] via SUBCUTANEOUS
  Administered 2021-06-21 – 2021-06-22 (×2): 2 [IU] via SUBCUTANEOUS
  Administered 2021-06-22: 09:00:00 1 [IU] via SUBCUTANEOUS
  Filled 2021-06-20 (×5): qty 1

## 2021-06-20 MED ORDER — LOSARTAN POTASSIUM 50 MG PO TABS
100.0000 mg | ORAL_TABLET | Freq: Every day | ORAL | Status: DC
  Administered 2021-06-21 – 2021-06-22 (×2): 100 mg via ORAL
  Filled 2021-06-20 (×2): qty 2

## 2021-06-20 MED ORDER — LORAZEPAM 2 MG/ML IJ SOLN: 1.0000 mg | INTRAMUSCULAR | Status: DC | PRN

## 2021-06-20 MED ORDER — METOPROLOL TARTRATE 50 MG PO TABS
75.0000 mg | ORAL_TABLET | Freq: Every day | ORAL | Status: DC
  Administered 2021-06-21 – 2021-06-22 (×2): 75 mg via ORAL
  Filled 2021-06-20 (×2): qty 1

## 2021-06-20 MED ORDER — LEVETIRACETAM 500 MG PO TABS
500.0000 mg | ORAL_TABLET | Freq: Two times a day (BID) | ORAL | Status: DC
  Administered 2021-06-20 – 2021-06-22 (×4): 500 mg via ORAL
  Filled 2021-06-20 (×6): qty 1

## 2021-06-20 MED ORDER — SERTRALINE HCL 50 MG PO TABS
100.0000 mg | ORAL_TABLET | Freq: Every day | ORAL | Status: DC
  Administered 2021-06-21 – 2021-06-22 (×2): 100 mg via ORAL
  Filled 2021-06-20 (×3): qty 2

## 2021-06-20 MED ORDER — LORAZEPAM 2 MG/ML IJ SOLN
0.5000 mg | Freq: Once | INTRAMUSCULAR | Status: AC
  Administered 2021-06-20: 0.5 mg via INTRAVENOUS
  Filled 2021-06-20: qty 1

## 2021-06-20 MED ORDER — INSULIN GLARGINE-YFGN 100 UNIT/ML ~~LOC~~ SOLN
25.0000 [IU] | Freq: Every day | SUBCUTANEOUS | Status: DC
  Administered 2021-06-20 – 2021-06-21 (×2): 25 [IU] via SUBCUTANEOUS
  Filled 2021-06-20 (×3): qty 0.25

## 2021-06-20 MED ORDER — ACETAMINOPHEN 325 MG PO TABS: 650.0000 mg | ORAL_TABLET | Freq: Four times a day (QID) | ORAL | Status: DC | PRN

## 2021-06-20 MED ORDER — ENOXAPARIN SODIUM 40 MG/0.4ML IJ SOSY
40.0000 mg | PREFILLED_SYRINGE | INTRAMUSCULAR | Status: DC
  Administered 2021-06-20 – 2021-06-21 (×2): 40 mg via SUBCUTANEOUS
  Filled 2021-06-20 (×2): qty 0.4

## 2021-06-20 MED ORDER — HYDRALAZINE HCL 50 MG PO TABS
50.0000 mg | ORAL_TABLET | Freq: Three times a day (TID) | ORAL | Status: DC
  Administered 2021-06-20 – 2021-06-22 (×5): 50 mg via ORAL
  Filled 2021-06-20 (×5): qty 1

## 2021-06-20 MED ORDER — INSULIN ASPART 100 UNIT/ML IJ SOLN
0.0000 [IU] | Freq: Every day | INTRAMUSCULAR | Status: DC
  Administered 2021-06-21: 2 [IU] via SUBCUTANEOUS
  Filled 2021-06-20: qty 1

## 2021-06-20 MED ORDER — HYDRALAZINE HCL 20 MG/ML IJ SOLN: 5.0000 mg | INTRAMUSCULAR | Status: DC | PRN

## 2021-06-20 NOTE — ED Notes (Signed)
Pt returned from CT °

## 2021-06-20 NOTE — ED Provider Notes (Signed)
I sent care of this patient approximately 1500.  Please see ongoing evaluation note for full details regarding patient's initial evaluation assessment.  In brief patient presents for recurrent syncope after standing admitted for same with extensive work-up that was all unremarkable.  Plan is to observe after initial ED work-up largely unremarkable and patient was able to ambulate likely discharge.  On my reassessment patient was able to stand but then immediately appeared to syncopized collapsing into examiner's arms.  She did not seem postictal and the episode lasted less than 20 seconds and was not associated with any shaking or urinary incontinence quickly was back to her neurological baseline.  I will admit for recurrent syncope is at this time I do not believe patient is safe for discharge home.   Lucrezia Starch, MD 06/20/21 [phone]

## 2021-06-20 NOTE — ED Triage Notes (Addendum)
Pt from home via [facility] with c/o HTN, she called for feeling whoozy. When the fire department got there she was on the couch and went out several times on them. At one time she was looking off with a blank stare at other times her eyes will close and eyes will flutter. She goes completely limp and come back in her baseline except that she does not remember passing out. When EMS tried to stand her up she passed out several times for them. She went out twice while we were moving her from EMS stretcher to the hospital stretcher. Pt does take a seizure medication in Nov

## 2021-06-20 NOTE — Discharge Instructions (Addendum)
[facility] in [place], [place] Address: [address], [place], [place] [address] Hours:  Closes soon ? 5PM ? Caesar Bookman Phone: [phone]

## 2021-06-20 NOTE — ED Provider Notes (Signed)
[facility] Emergency Department Provider Note  Time seen: 10:57 AM  I have reviewed the triage vital signs and the nursing notes.   HISTORY  Chief Complaint Loss of Consciousness and Hypertension   HPI Kerry Webb is a 55 y.o. female with a past medical history of diabetes, gastric reflux, hypertension, possible seizures, presents to the emergency department for high blood pressure and syncopal events.  According to the patient she was seen at the walk-in clinic yesterday for her high blood pressure, patient remains on hypertension medications.  States she checked it at home this morning it was 342 systolic.  Patient states she became very concerned and called EMS.  Patient states she has been under a tremendous amount of stress lately, states she is not working she has no money and she has many bills that are due.  Patient states over the past 20+ years she has been experiencing episodes intermittently of syncope where she passes out they thought it was due to seizures but states that has been ruled out.  She states the events only happen when she is under tremendous amount of stress.  Patient was admitted to the hospital approximately 1 month ago for the same at that time she had a extensive work-up performed both by cardiology and neurology with no concerning findings including EEG, echocardiogram, telemetry.  Patient just turned in a 30-day Holter monitor as well.  Patient denies any chest pain now or at any point.   Past Medical History:  Diagnosis Date   Diabetes mellitus without complication ([place])    GERD (gastroesophageal reflux disease)    Heart murmur 1967   Hypertension 1993   Lump or mass in breast    Left   Seizures ([place]) [place]    Patient Active Problem List   Diagnosis Date Noted   History of seizure 06/13/2021   Encounter to establish care 06/13/2021   Dysphoric mood 06/13/2021   Spell of abnormal behavior    Syncope  05/07/2021   Syncope and collapse 04/30/2021   Primary hypertension    Acute cholecystitis 05/17/2019   Family history of breast cancer 01/06/2013   History of nipple discharge 01/06/2013    Past Surgical History:  Procedure Laterality Date   ABDOMINAL HYSTERECTOMY  2004   APPENDECTOMY  2014   BREAST BIOPSY Right 01-23-14   benign   BREAST SURGERY Left 01/14/12   BREAST SURGERY Right 05-16-14   benign   CHOLECYSTECTOMY N/A 05/18/2019   Procedure: LAPAROSCOPIC CHOLECYSTECTOMY;  Surgeon: Benjamine Sprague, DO;  Location: [facility] ORS;  Service: General;  Laterality: N/A;   COLONOSCOPY  2013   Dr. Candace Cruise    Prior to Admission medications   Medication Sig Start Date End Date Taking? Authorizing Provider  atorvastatin (LIPITOR) 40 MG tablet Take 1 tablet (40 mg total) by mouth once daily. 06/13/21 09/11/21  Iloabachie, Chioma E, NP  blood glucose meter kit and supplies KIT USE AS DIRECTED TO CHECK BLOOD SUGAR UP TO 4 TIMES DAILY. 06/19/21   Iloabachie, Chioma E, NP  dapagliflozin propanediol (FARXIGA) 10 MG TABS tablet Take 1 tablet (10 mg total) by mouth once every morning. 06/13/21 09/11/21  Iloabachie, Chioma E, NP  glucose blood (RIGHTEST GS550 BLOOD GLUCOSE) test strip USE AS DIRECTED TO CHECK BLOOD SUGAR UP TO 4 TIMES DAILY. 06/19/21   Iloabachie, Chioma E, NP  hydrALAZINE (APRESOLINE) 50 MG tablet Take 1 tablet (50 mg total) by mouth 3 (three) times daily. 06/19/21 09/17/21  Iloabachie, Chioma E, NP  Insulin Glargine (BASAGLAR KWIKPEN) 100 UNIT/ML Inject 30 Units into the skin once daily at bedtime. (Replaces Lantus) 06/13/21   Iloabachie, Chioma E, NP  Insulin Pen Needle 32G X 4 MM MISC Use as directed with insulin pen 06/13/21   Iloabachie, Chioma E, NP  levETIRAcetam (KEPPRA) 500 MG tablet Take 1 tablet (500 mg total) by mouth 2 (two) times daily. 06/13/21   Iloabachie, Chioma E, NP  losartan (COZAAR) 100 MG tablet Take 1 tablet (100 mg total) by mouth once daily. 06/13/21 09/11/21  Iloabachie, Chioma E,  NP  metFORMIN (GLUCOPHAGE-XR) 500 MG 24 hr tablet Take 1 tablet (500 mg total) by mouth once daily with breakfast. 06/19/21   Iloabachie, Chioma E, NP  metoprolol tartrate (LOPRESSOR) 50 MG tablet Take 1 and (1/2) tablet (75 mg total) by mouth 2 (two) times daily. 06/13/21 09/11/21  Iloabachie, Chioma E, NP  Rightest GL300 Lancets MISC USE AS DIRECTED TO CHECK BLOOD SUGAR UP TO 4 TIMES DAILY. 06/19/21   Iloabachie, Chioma E, NP  sertraline (ZOLOFT) 100 MG tablet Take 1 tablet (100 mg total) by mouth once daily. 06/13/21 09/11/21  Iloabachie, Chioma E, NP    Allergies  Allergen Reactions   Cashew Nut Oil Shortness Of Breath   Shellfish Allergy Shortness Of Breath   Asa [Aspirin]     Intestinal bleeding    Family History  Problem Relation Age of Onset   Liver cancer Mother    Cancer Mother        liver   Thyroid disease Mother    Kidney disease Father    Alcohol abuse Father    Other Brother        auto accident   Diabetes Maternal Aunt    Breast cancer Maternal Aunt    Cancer Maternal Grandmother        breast   Hypertension Paternal Grandmother    Kidney disease Paternal Grandfather    Hypertension Paternal Grandfather     Social History Social History   Tobacco Use   Smoking status: Never   Smokeless tobacco: Never  Vaping Use   Vaping Use: Never used  Substance Use Topics   Alcohol use: Not Currently    Alcohol/week: 1.0 standard drink    Types: 1 Glasses of wine per week    Comment: last use 03/2021   Drug use: Never    Review of Systems Constitutional: Negative for fever. Eyes: Negative for visual complaints ENT: Negative for recent illness/congestion Cardiovascular: Negative for chest pain. Respiratory: Negative for shortness of breath. Gastrointestinal: Negative for abdominal pain, vomiting and diarrhea. Genitourinary: Negative for urinary compaints Musculoskeletal: Negative for musculoskeletal complaints Skin: Negative for skin complaints  Neurological:  Negative for headache All other ROS negative  ____________________________________________   PHYSICAL EXAM:  VITAL SIGNS: ED Triage Vitals  Enc Vitals Group     BP 06/20/21 1034 (!) 149/92     Pulse Rate 06/20/21 1034 73     Resp 06/20/21 1034 17     Temp 06/20/21 1034 98.5 F (36.9 C)     Temp Source 06/20/21 1034 Oral     SpO2 06/20/21 1034 100 %     Weight 06/20/21 1035 190 lb (86.2 kg)     Height 06/20/21 1035 5' 5"  (1.651 m)     Head Circumference --      Peak Flow --      Pain Score 06/20/21 1035 4     Pain Loc --  Pain Edu? --      Excl. in [place]? --     Constitutional: Alert and oriented.  Patient is tearful, appears anxious. Eyes: Normal exam ENT      Head: Normocephalic and atraumatic.      Mouth/Throat: Mucous membranes are moist. Cardiovascular: Normal rate, regular rhythm.  Respiratory: Normal respiratory effort without tachypnea nor retractions. Breath sounds are clear Gastrointestinal: Soft and nontender. No distention.  Musculoskeletal: Nontender with normal range of motion in all extremities.  Neurologic:  Normal speech and language. No gross focal neurologic deficits  Skin:  Skin is warm, dry and intact.  Psychiatric: Anxious, tearful.  ____________________________________________    EKG  EKG viewed and interpreted by myself shows a normal sinus rhythm at 71 bpm with a narrow QRS, normal axis, normal intervals no concerning ST changes.  ____________________________________________   INITIAL IMPRESSION / ASSESSMENT AND PLAN / ED COURSE  Pertinent labs & imaging results that were available during my care of the patient were reviewed by me and considered in my medical decision making (see chart for details).   Patient presents emergency department for hypertension as well as syncopal event.  Currently blood pressure is controlled 149/92.  Patient is anxious and tearful.  States she has these syncopal events anytime she gets stressed out.  Patient  is taking Keppra as prescribed by her doctor although states she has had an extensive work-up for seizures that have been negative.  Patient most recently had a 24-hour EEG that was negative as well as an extensive neurology and cardiac work-up per record review.  Given the patient's symptoms we will check labs including cardiac enzyme, EKG and COVID swab.  We will dose a small dose of anxiety medication and continue to closely monitor.  Patient agreeable to plan of care.  Patient's work-up is overall reassuring.  Patient is feeling much better blood pressure currently 110/80 on my examination.  I do believe patient would benefit from a short course of an anxiolytic and discussed with patient she needs to follow-up with her doctor regarding this.  Patient agreeable to plan of care.  Kerry Webb was evaluated in Emergency Department on 06/20/2021 for the symptoms described in the history of present illness. She was evaluated in the context of the global COVID-19 pandemic, which necessitated consideration that the patient might be at risk for infection with the SARS-CoV-2 virus that causes COVID-19. Institutional protocols and algorithms that pertain to the evaluation of patients at risk for COVID-19 are in a state of rapid change based on information released by regulatory bodies including the CDC and federal and state organizations. These policies and algorithms were followed during the patient's care in the ED.  ____________________________________________   FINAL CLINICAL IMPRESSION(S) / ED DIAGNOSES  Hypertension Anxiety   Harvest Dark, MD 06/20/21 1431

## 2021-06-20 NOTE — H&P (Signed)
History and Physical    ARUNA NESTLER GLO:[id number] DOB: 1966/05/05 DOA: 06/20/2021  Referring MD/NP/PA:   PCP: Langston Reusing, NP   Patient coming from:  The patient is coming from home.  At baseline, pt is independent for most of ADL.        Chief Complaint: Loss of consciousness  HPI: Kerry Webb is a 55 y.o. female with medical history significant of hypertension, hyperlipidemia, diabetes mellitus, GERD, depression, possible seizure, who presents with loss of consciousness.  Pt states that ever since she was 55 years old, has been having intermittent syncope episode.  No clear diagnosis was made.  It was thought possibly due to seizure.  She is taking Keppra for possible seizure, but states that seizure diagnosis is questionable. Pt was recently hospitalized, and had extensive work-up by neurologist and cardiology.  All test were unremarkable, including MRI of brain, carotid artery ultrasound, EEG and overnight EEG, 2D echo. She also had cardiac monitor from 11/14 to 12/13 which was negative for critical or serious findings.   Pt states that she has passed out several times since this morning.  No unilateral numbness or tinglings in extremities.  No facial droop or slurred speech.  Patient moves all extremities normally.  Patient does not have chest pain, cough, shortness breath.  No fever or chills.  No nausea, vomiting, diarrhea or abdominal pain.  No symptoms of UTI.  Patient states she has been under a tremendous amount of stress lately. States she lost her job and is not working. She has no money. She has many bills that are due.  She also reports that her blood pressure is poorly controlled. States she checked it at home this morning it was 188 systolic. He blood pressure is 167/111 and 124/83 in ED.   Of note, per Dr. Tamala Julian of ED, pt passed out in his arm when he was ambulating pt in ED. No seizure activity was noted. No urinary incontinence. Pt did not seem  postictal.   ED Course: pt was found to have WBC 7.0, troponin level 7, pending COVID PCR, electrolytes renal function okay, temperature normal, blood pressure 167/111, 124/83, heart rate 65, RR 20, oxygen saturation 98% on room air.  Patient is placed on MedSurg bed for observation.  Dr. Weber Cooks of psychiatry is consulted.  Review of Systems:   General: no fevers, chills, no body weight gain, has fatigue HEENT: no blurry vision, hearing changes or sore throat Respiratory: no dyspnea, coughing, wheezing CV: no chest pain, no palpitations GI: no nausea, vomiting, abdominal pain, diarrhea, constipation GU: no dysuria, burning on urination, increased urinary frequency, hematuria  Ext: no leg edema Neuro: no unilateral weakness, numbness, or tingling, no vision change or hearing loss. Has syncope Skin: no rash, no skin tear. MSK: No muscle spasm, no deformity, no limitation of range of movement in spin Heme: No easy bruising.  Travel history: No recent long distant travel.  Allergy:  Allergies  Allergen Reactions   Cashew Nut Oil Shortness Of Breath   Shellfish Allergy Shortness Of Breath   Asa [Aspirin]     Intestinal bleeding    Past Medical History:  Diagnosis Date   Diabetes mellitus without complication ([place])    GERD (gastroesophageal reflux disease)    Heart murmur 1967   Hypertension 1993   Lump or mass in breast    Left   Seizures ([place]) 1983   Ulcer 1985    Past Surgical History:  Procedure Laterality Date  ABDOMINAL HYSTERECTOMY  2004   APPENDECTOMY  2014   BREAST BIOPSY Right 01-23-14   benign   BREAST SURGERY Left 01/14/12   BREAST SURGERY Right 05-16-14   benign   CHOLECYSTECTOMY N/A 05/18/2019   Procedure: LAPAROSCOPIC CHOLECYSTECTOMY;  Surgeon: Benjamine Sprague, DO;  Location: [facility] ORS;  Service: General;  Laterality: N/A;   COLONOSCOPY  2013   Dr. Candace Cruise    Social History:  reports that she has never smoked. She has never used smokeless tobacco. She reports  that she does not currently use alcohol after a past usage of about 1.0 standard drink per week. She reports that she does not use drugs.  Family History:  Family History  Problem Relation Age of Onset   Liver cancer Mother    Cancer Mother        liver   Thyroid disease Mother    Kidney disease Father    Alcohol abuse Father    Other Brother        auto accident   Diabetes Maternal Aunt    Breast cancer Maternal Aunt    Cancer Maternal Grandmother        breast   Hypertension Paternal Grandmother    Kidney disease Paternal Grandfather    Hypertension Paternal Grandfather      Prior to Admission medications   Medication Sig Start Date End Date Taking? Authorizing Provider  LORazepam (ATIVAN) 1 MG tablet Take 1 tablet (1 mg total) by mouth daily as needed for anxiety. 06/20/21 07/20/21 Yes Harvest Dark, MD  atorvastatin (LIPITOR) 40 MG tablet Take 1 tablet (40 mg total) by mouth once daily. 06/13/21 09/11/21  Iloabachie, Chioma E, NP  blood glucose meter kit and supplies KIT USE AS DIRECTED TO CHECK BLOOD SUGAR UP TO 4 TIMES DAILY. 06/19/21   Iloabachie, Chioma E, NP  dapagliflozin propanediol (FARXIGA) 10 MG TABS tablet Take 1 tablet (10 mg total) by mouth once every morning. 06/13/21 09/11/21  Iloabachie, Chioma E, NP  glucose blood (RIGHTEST GS550 BLOOD GLUCOSE) test strip USE AS DIRECTED TO CHECK BLOOD SUGAR UP TO 4 TIMES DAILY. 06/19/21   Iloabachie, Chioma E, NP  hydrALAZINE (APRESOLINE) 50 MG tablet Take 1 tablet (50 mg total) by mouth 3 (three) times daily. 06/19/21 09/17/21  Iloabachie, Chioma E, NP  Insulin Glargine (BASAGLAR KWIKPEN) 100 UNIT/ML Inject 30 Units into the skin once daily at bedtime. (Replaces Lantus) 06/13/21   Iloabachie, Chioma E, NP  Insulin Pen Needle 32G X 4 MM MISC Use as directed with insulin pen 06/13/21   Iloabachie, Chioma E, NP  levETIRAcetam (KEPPRA) 500 MG tablet Take 1 tablet (500 mg total) by mouth 2 (two) times daily. 06/13/21   Iloabachie, Chioma  E, NP  losartan (COZAAR) 100 MG tablet Take 1 tablet (100 mg total) by mouth once daily. 06/13/21 09/11/21  Iloabachie, Chioma E, NP  metFORMIN (GLUCOPHAGE-XR) 500 MG 24 hr tablet Take 1 tablet (500 mg total) by mouth once daily with breakfast. 06/19/21   Iloabachie, Chioma E, NP  metoprolol tartrate (LOPRESSOR) 50 MG tablet Take 1 and (1/2) tablet (75 mg total) by mouth 2 (two) times daily. 06/13/21 09/11/21  Iloabachie, Chioma E, NP  Rightest GL300 Lancets MISC USE AS DIRECTED TO CHECK BLOOD SUGAR UP TO 4 TIMES DAILY. 06/19/21   Iloabachie, Chioma E, NP  sertraline (ZOLOFT) 100 MG tablet Take 1 tablet (100 mg total) by mouth once daily. 06/13/21 09/11/21  Iloabachie, Lonny Prude, NP    Physical Exam: Vitals:  06/20/21 1230 06/20/21 1245 06/20/21 1330 06/20/21 1430  BP: 129/80  126/74 124/83  Pulse: 65 62 (!) 59 65  Resp: 20 17 18 15   Temp:      TempSrc:      SpO2: 100% 99% 98% 98%  Weight:      Height:       General: Not in acute distress HEENT:       Eyes: PERRL, EOMI, no scleral icterus.       ENT: No discharge from the ears and nose, no pharynx injection, no tonsillar enlargement.        Neck: No JVD, no bruit, no mass felt. Heme: No neck lymph node enlargement. Cardiac: S1/S2, RRR, No gallops or rubs. Respiratory: No rales, wheezing, rhonchi or rubs. GI: Soft, nondistended, nontender, no rebound pain, no organomegaly, BS present. GU: No hematuria Ext: No pitting leg edema bilaterally. 1+DP/PT pulse bilaterally. Musculoskeletal: No joint deformities, No joint redness or warmth, no limitation of ROM in spin. Skin: No rashes.  Neuro: Alert, oriented X3, cranial nerves II-XII grossly intact, moves all extremities normally.  Psych: Patient is not psychotic, no suicidal or hemocidal ideation.  Labs on Admission: I have personally reviewed following labs and imaging studies  CBC: Recent Labs  Lab 06/20/21 1046  WBC 7.0  HGB 14.6  HCT 44.0  MCV 88.7  PLT 147   Basic Metabolic  Panel: Recent Labs  Lab 06/20/21 1046  NA 137  K 3.8  CL 106  CO2 24  GLUCOSE 349*  BUN 13  CREATININE 0.66  CALCIUM 8.8*   GFR: Estimated Creatinine Clearance: 86.2 mL/min (by C-G formula based on SCr of 0.66 mg/dL). Liver Function Tests: No results for input(s): AST, ALT, ALKPHOS, BILITOT, PROT, ALBUMIN in the last 168 hours. No results for input(s): LIPASE, AMYLASE in the last 168 hours. No results for input(s): AMMONIA in the last 168 hours. Coagulation Profile: No results for input(s): INR, PROTIME in the last 168 hours. Cardiac Enzymes: No results for input(s): CKTOTAL, CKMB, CKMBINDEX, TROPONINI in the last 168 hours. BNP (last 3 results) No results for input(s): PROBNP in the last 8760 hours. HbA1C: No results for input(s): HGBA1C in the last 72 hours. CBG: Recent Labs  Lab 06/20/21 1036  GLUCAP 320*   Lipid Profile: Recent Labs    06/19/21 1431  CHOL 256*  HDL 34*  LDLCALC 160*  TRIG 325*  CHOLHDL 7.5*   Thyroid Function Tests: No results for input(s): TSH, T4TOTAL, FREET4, T3FREE, THYROIDAB in the last 72 hours. Anemia Panel: No results for input(s): VITAMINB12, FOLATE, FERRITIN, TIBC, IRON, RETICCTPCT in the last 72 hours. Urine analysis:    Component Value Date/Time   COLORURINE YELLOW (A) 05/02/2021 1030   APPEARANCEUR CLEAR (A) 05/02/2021 1030   APPEARANCEUR Hazy 09/21/2012 2055   LABSPEC 1.025 05/02/2021 1030   LABSPEC 1.013 09/21/2012 2055   PHURINE 7.0 05/02/2021 1030   GLUCOSEU >=500 (A) 05/02/2021 1030   GLUCOSEU Negative 09/21/2012 2055   HGBUR NEGATIVE 05/02/2021 1030   BILIRUBINUR NEGATIVE 05/02/2021 1030   BILIRUBINUR Negative 09/21/2012 2055   KETONESUR NEGATIVE 05/02/2021 1030   PROTEINUR NEGATIVE 05/02/2021 1030   NITRITE NEGATIVE 05/02/2021 1030   LEUKOCYTESUR NEGATIVE 05/02/2021 1030   LEUKOCYTESUR Negative 09/21/2012 2055   Sepsis Labs: @LABRCNTIP (procalcitonin:4,lacticidven:4) )No results found for this or any previous  visit (from the past 240 hour(s)).   Radiological Exams on Admission: No results found.   EKG: I have personally reviewed.  Sinus rhythm, QTC 411, mild  T wave inversion in lead I/aVL.  Assessment/Plan Principal Problem:   Syncope Active Problems:   History of seizure   Diabetes mellitus without complication ([facility])   Hypertension   HLD (hyperlipidemia)   Depression   Syncope: Patient has recurrent syncope episode for many years without clear diagnosis was made.  Patient was recently hospitalized and had extensive work-up, but all tests were unremarkable.  Etiology is not clear. She state that she has been under tremendous stress recently, indicating possible stress related.   -will place on MedSurg bed for observation -Frequent neuro check -Fall precaution -Orthostatic vital sign -Consulted psychiatry, Dr. Weber Cooks -will get CT-head to make sure patient does not have any head injury  History of seizure -Seizure precaution -When necessary Ativan for seizure -Continue Home medications: Keppra 500 mg twice daily  Diabetes mellitus without complication [facility]): Recent A1c 9.1, poorly controlled.  Patient is taking glargine insulin 30 unit daily and Farxiga. -Sliding scale insulin -Glargine insulin 25 unit daily  Hypertension -IV hydralazine as needed -Oral hydralazine, Cozaar, metoprolol  HLD (hyperlipidemia) -Lipitor  Depression -zoloft         DVT ppx: SQ Lovenox Code Status: Full code Family Communication: not done, no family member is at bed side.       Disposition Plan:  Anticipate discharge back to previous environment Consults called:  Dr. Weber Cooks of psychiatry Admission status and Level of care: Telemetry Medical:    for obs   Status is: Observation  The patient remains OBS appropriate and will d/c before 2 midnights.           Date of Service 06/20/2021    [facility]   If 7PM-7AM, please contact  night-coverage [email or web address] 06/20/2021, 4:39 PM

## 2021-06-20 NOTE — ED Notes (Signed)
Pt placed on purewick 

## 2021-06-21 ENCOUNTER — Ambulatory Visit: Payer: Self-pay | Admitting: Pharmacy Technician

## 2021-06-21 DIAGNOSIS — F4323 Adjustment disorder with mixed anxiety and depressed mood: Secondary | ICD-10-CM | POA: Diagnosis present

## 2021-06-21 DIAGNOSIS — Z79899 Other long term (current) drug therapy: Secondary | ICD-10-CM

## 2021-06-21 LAB — URINE DRUG SCREEN, QUALITATIVE (ARMC ONLY)
Amphetamines, Ur Screen: NOT DETECTED
Barbiturates, Ur Screen: NOT DETECTED
Benzodiazepine, Ur Scrn: NOT DETECTED
Cannabinoid 50 Ng, Ur ~~LOC~~: NOT DETECTED
Cocaine Metabolite,Ur ~~LOC~~: NOT DETECTED
MDMA (Ecstasy)Ur Screen: NOT DETECTED
Methadone Scn, Ur: NOT DETECTED
Opiate, Ur Screen: NOT DETECTED
Phencyclidine (PCP) Ur S: NOT DETECTED
Tricyclic, Ur Screen: NOT DETECTED

## 2021-06-21 LAB — URINALYSIS, ROUTINE W REFLEX MICROSCOPIC
Bacteria, UA: NONE SEEN
Bilirubin Urine: NEGATIVE
Glucose, UA: >=500 mg/dL — AB
Hgb urine dipstick: NEGATIVE
Ketones, ur: NEGATIVE mg/dL
Nitrite: NEGATIVE
Protein, ur: NEGATIVE mg/dL
Specific Gravity, Urine: 1.029 (ref 1.005–1.030)
pH: 7 (ref 5.0–8.0)

## 2021-06-21 LAB — GLUCOSE, CAPILLARY
Glucose-Capillary: 140 mg/dL — ABNORMAL HIGH (ref 70–99)
Glucose-Capillary: 182 mg/dL — ABNORMAL HIGH (ref 70–99)
Glucose-Capillary: 173 mg/dL — ABNORMAL HIGH (ref 70–99)
Glucose-Capillary: 205 mg/dL — ABNORMAL HIGH (ref 70–99)

## 2021-06-21 MED ORDER — ALPRAZOLAM 0.25 MG PO TABS
0.2500 mg | ORAL_TABLET | Freq: Two times a day (BID) | ORAL | Status: DC | PRN
  Administered 2021-06-21 (×2): 0.25 mg via ORAL
  Filled 2021-06-21 (×2): qty 1

## 2021-06-21 MED ORDER — GABAPENTIN 100 MG PO CAPS
100.0000 mg | ORAL_CAPSULE | Freq: Two times a day (BID) | ORAL | 1 refills | Status: DC | PRN
  Filled 2021-06-21: qty 60, 30d supply, fill #0

## 2021-06-21 MED ORDER — ALPRAZOLAM 0.25 MG PO TABS
0.2500 mg | ORAL_TABLET | Freq: Two times a day (BID) | ORAL | 0 refills | Status: DC | PRN
Start: 2021-06-21 — End: 2021-06-21

## 2021-06-21 MED ORDER — METOPROLOL TARTRATE 50 MG PO TABS
50.0000 mg | ORAL_TABLET | Freq: Every day | ORAL | 1 refills | Status: DC
  Filled 2021-06-21: qty 90, 90d supply, fill #0
  Filled 2021-07-18: qty 30, 30d supply, fill #0
  Filled 2021-08-19: qty 30, 30d supply, fill #1
  Filled 2021-09-20: qty 30, 30d supply, fill #2
  Filled 2021-10-21: qty 30, 30d supply, fill #3

## 2021-06-21 MED ORDER — METOPROLOL TARTRATE 50 MG PO TABS
75.0000 mg | ORAL_TABLET | Freq: Every day | ORAL | Status: DC
  Filled 2021-06-21: qty 4.5
  Filled 2021-06-21: qty 2

## 2021-06-21 MED ORDER — GABAPENTIN 100 MG PO CAPS: 100.0000 mg | ORAL_CAPSULE | Freq: Two times a day (BID) | ORAL | Status: DC | PRN

## 2021-06-21 NOTE — Progress Notes (Signed)
Around 1100 on 06/21/21 Pt. Had a witness/assisted fall.  I was in the patient's room after discharge had been discussed and attempt to stand patient up to pivot over to the [facility].  We sat on the side of the bed for a moment, confirmed patient was feeling alright and stood up.  Pt. Initially stood up and stated she felt okay, a moment later she passed out.  At the time the patient was going down I had hold of her hand and attempt to prevent the patient from going down.  The patient did go down landing on her butt first but I believe I slowed her and her upper body was guided down more slowly as I attempt to keep her up.  Patient was then assisted back into the bed with by 3 people seeming to pass out during our attempt momentarily again.  Dr. Reesa Chew was near and came to the bedside and made aware of what happened.  Xanax for her anxiety was ordered but not xray was deemed necessary/ordered.  Vitals obtained and revealed she had become hypertensive during the incident but it self resolved with her lying back in the bed.  I performed a physical and neuro assessment and did not find any deficits and patient was not complaining of pain.  Xanax was administered as recommended by the MD.  Patient was reassessed roughly 2 hours after the incident per the post fall protocol and no abnormalities or changes were noted.  Vitals obtained again and remained stable.  06/21/21 1116  What Happened  Was fall witnessed? Yes  Who witnessed fall? beside nurse, Orland Penman, RN  Patients activity before fall other (comment) (stood patient up to move to [facility] before possible discharge)  Point of contact buttocks  Was patient injured? No  Follow Up  MD notified Reesa Chew, MD  Time MD notified 40  Family notified No - patient refusal (pt. stated she would inform family no d/c but did not want to inform them she fell)  Additional tests No (MD did not want further tests, give PRN anxiety med per instructions of bedside MD)  Simple  treatment Other (comment) (none)  Adult Fall Risk Assessment  Risk Factor Category (scoring not indicated) Fall has occurred during this admission (document High fall risk)  Patient Fall Risk Level High fall risk  Adult Fall Risk Interventions  Required Bundle Interventions *See Row Information* High fall risk - low, moderate, and high requirements implemented  Additional Interventions Use of appropriate toileting equipment (bedpan, BSC, etc.)  Screening for Fall Injury Risk (To be completed on HIGH fall risk patients) - Assessing Need for Floor Mats  Risk For Fall Injury- Criteria for Floor Mats Previous fall this admission  Will Implement Floor Mats Yes  Vitals  Temp 98.1 F (36.7 C)  Temp Source Oral  BP (!) 163/94  MAP (mmHg) 116  BP Location Right Arm  BP Method Automatic  Patient Position (if appropriate) Lying  Pulse Rate 67  Pulse Rate Source Monitor  ECG Heart Rate 75  Resp (!) 22  Oxygen Therapy  SpO2 99 %  O2 Device Room Air  Pain Assessment  Pain Scale 0-10  Pain Score 0  Neurological  Neuro (WDL) WDL  Level of Consciousness Alert  Orientation Level Oriented X4  Cognition Follows commands  Speech Clear  Pupil Assessment  Yes  R Pupil Size (mm) 3  R Pupil Shape Round  R Pupil Reaction Brisk  L Pupil Size (mm) 3  L Pupil  Shape Round  L Pupil Reaction Brisk  Additional Pupil Assessments No  Neuro Symptoms Anxiety  Glasgow Coma Scale  Eye Opening 4  Best Verbal Response (NON-intubated) 5  Best Motor Response 6  Glasgow Coma Scale Score 15  Musculoskeletal  Musculoskeletal (WDL) WDL  Integumentary  Integumentary (WDL) WDL

## 2021-06-21 NOTE — Evaluation (Signed)
Physical Therapy Evaluation Patient Details Name: Kerry Webb MRN: [id number] DOB: [birth year] Today's Date: 06/21/2021  History of Present Illness  Pt is a 55 y.o. female with medical history significant of hypertension, hyperlipidemia, diabetes mellitus, GERD, depression, possible seizures, who presents with loss of consciousness. MD assessment includes: syncope.   Clinical Impression  Pt was pleasant and motivated to participate during the session and put forth good effort throughout. Pt reported feeling like her ears have needed to pop and that it is a new symptom in the past month. Pt demonstrated adequate strength with all bed exercises.Pt was able to complete bed mobility with mod independence with increased time and effort. Pt reported feeling slightly dizzy upon sitting but overall okay. Pt required CGA for STS due to history of syncopal episodes. Pt reported dizziness being more intense but able to maintain balance. BP was taken in supine, seated, and standing (listed below). Once BP in standing was taken, pt began to look dazed and drowsy and required a break. PT deferred any further ambulation due to orthostatic presentation. Pt will benefit from HHPT upon discharge to safely address deficits listed in patient problem list for decreased caregiver assistance and eventual return to PLOF.   Orthostatic VS for the past 24 hrs:  BP- Lying Pulse- Lying BP- Sitting Pulse- Sitting BP- Standing at 0 minutes Pulse- Standing at 0 minutes  06/21/21 1330 158/89 78 (!) 144/97 81 (!) 181/113 91         Recommendations for follow up therapy are one component of a multi-disciplinary discharge planning process, led by the attending physician.  Recommendations may be updated based on patient status, additional functional criteria and insurance authorization.  Follow Up Recommendations Home health PT    Assistance Recommended at Discharge Intermittent Supervision/Assistance  Functional Status  Assessment Patient has had a recent decline in their functional status and demonstrates the ability to make significant improvements in function in a reasonable and predictable amount of time.  Equipment Recommendations  Rolling walker (2 wheels) (for balance purposes due to syncopal episodes)    Recommendations for Other Services       Precautions / Restrictions Precautions Precautions: Fall Restrictions Weight Bearing Restrictions: No      Mobility  Bed Mobility Overal bed mobility: Modified Independent             General bed mobility comments: Increased time and effort, no assistance from PT, reported feeling slightly dizzy upon sitting    Transfers Overall transfer level: Needs assistance Equipment used: Rolling walker (2 wheels) Transfers: Sit to/from Stand Sit to Stand: Min guard           General transfer comment: Min guard for safety with RW, upon standing pt reporting feeling slightly more dizzy and following BP pt began to look glazed and drowsy    Ambulation/Gait               General Gait Details: NT, defer due to orthostatics  Stairs            Wheelchair Mobility    Modified Rankin (Stroke Patients Only)       Balance Overall balance assessment: Needs assistance Sitting-balance support: Bilateral upper extremity supported;Feet supported Sitting balance-Leahy Scale: Normal     Standing balance support: Bilateral upper extremity supported;During functional activity Standing balance-Leahy Scale: Good Standing balance comment: Standing balance is good until becoming symptomatic due to BP issues  Pertinent Vitals/Pain Pain Assessment: No/denies pain    Home Living Family/patient expects to be discharged to:: Private residence Living Arrangements: Alone Available Help at Discharge: Friend(s);Available PRN/intermittently Type of Home: House Home Access: Stairs to enter Entrance  Stairs-Rails: Right;Left (does not recall whether they are close or wide) Entrance Stairs-Number of Steps: 2   Home Layout: One level Home Equipment: None      Prior Function Prior Level of Function : Independent/Modified Independent             Mobility Comments: Pt reports being independent with mobility, no AD, does not report falls but has had multiple LOB bouts and dizzy spells ADLs Comments: Pt reports being independent with ADLs     Hand Dominance        Extremity/Trunk Assessment   Upper Extremity Assessment Upper Extremity Assessment: Generalized weakness    Lower Extremity Assessment Lower Extremity Assessment: Generalized weakness       Communication   Communication: No difficulties  Cognition Arousal/Alertness: Awake/alert Behavior During Therapy: WFL for tasks assessed/performed Overall Cognitive Status: Within Functional Limits for tasks assessed                                          General Comments      Exercises Total Joint Exercises Ankle Circles/Pumps: AROM;Both;10 reps;Supine Quad Sets: AROM;Both;10 reps;Supine Straight Leg Raises: AROM;Both;10 reps;Supine General Exercises - Lower Extremity Long Arc Quad: AROM;Both;10 reps;Seated Straight Leg Raises: AROM;Both;10 reps;Seated Other Exercises Other Exercises: Pt education on BP concerns   Assessment/Plan    PT Assessment Patient needs continued PT services  PT Problem List Decreased strength;Decreased activity tolerance;Decreased balance;Decreased mobility;Decreased knowledge of use of DME       PT Treatment Interventions DME instruction;Gait training;Stair training;Functional mobility training;Therapeutic activities;Therapeutic exercise;Balance training;Patient/family education    PT Goals (Current goals can be found in the Care Plan section)  Acute Rehab PT Goals Patient Stated Goal: to go back to working PT Goal Formulation: With patient Time For Goal  Achievement: 07/04/21 Potential to Achieve Goals: Good    Frequency Min 2X/week   Barriers to discharge        Co-evaluation               AM-PAC PT "6 Clicks" Mobility  Outcome Measure Help needed turning from your back to your side while in a flat bed without using bedrails?: None Help needed moving from lying on your back to sitting on the side of a flat bed without using bedrails?: A Little Help needed moving to and from a bed to a chair (including a wheelchair)?: A Little Help needed standing up from a chair using your arms (e.g., wheelchair or bedside chair)?: A Little Help needed to walk in hospital room?: A Little Help needed climbing 3-5 steps with a railing? : A Little 6 Click Score: 19    End of Session Equipment Utilized During Treatment: Gait belt Activity Tolerance: Treatment limited secondary to medical complications (Comment) Patient left: in bed;with call bell/phone within reach;with bed alarm set;with nursing/sitter in room (Nursing in room) Nurse Communication: Mobility status PT Visit Diagnosis: Unsteadiness on feet (R26.81);Difficulty in walking, not elsewhere classified (R26.2);Muscle weakness (generalized) (M62.81);History of falling (Z91.81)    Time: 9735-3299 PT Time Calculation (min) (ACUTE ONLY): 38 min   Charges:              Sheldon Silvan SPT  06/21/21, 1:59 PM

## 2021-06-21 NOTE — Consult Note (Signed)
[facility] Face-to-Face Psychiatry Consult   Reason for Consult:  depression  Referring Physician:  Dr. Blaine Hamper Patient Identification: Kerry Webb MRN:  [id number] Principal Diagnosis: Syncope Diagnosis:  Principal Problem:   Syncope Active Problems:   Adjustment disorder with mixed anxiety and depressed mood   History of seizure   Diabetes mellitus without complication ([place])   Hypertension   HLD (hyperlipidemia)   Depression   Total Time spent with patient: 1 hour  Subjective:   Kerry Webb is a 55 y.o. female patient admitted with syncope.  HPI:  55 yo female with depression related to financial issues, including not being able to pay rent, car payment, etc.  She is also struggling with multiple losses from the past of losing her mother and a marriage of 31 years.  Agreeable to go to therapy to assist in this process.  "Right now I am fine", just when a bill collector calls.  No suicidal ideations or past attempts or mental health hospitalizations.  She has a good support system of friends and two adult children.  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  Anxiety is high, no panic attacks.  No substance use, hallucinations, or paranoia.  Currently she is taking Zoloft. Discussed medications and started PRN anxiety medication, see below.  Past Psychiatric History: none  Risk to Self:  none Risk to Others:  none Prior Inpatient Therapy:  none Prior Outpatient Therapy:  none  Past Medical History:  Past Medical History: Diagnosis Date  Diabetes mellitus without complication ([place])  GERD (gastroesophageal reflux disease)  Heart murmur 1967  Hypertension 1993  Lump or mass in breast Left  Seizures ([place]) 1983  Ulcer 1985   Past Surgical History: Procedure Laterality Date  ABDOMINAL HYSTERECTOMY 2004  APPENDECTOMY 2014  BREAST BIOPSY Right 01-23-14 benign  BREAST SURGERY Left 01/14/12  BREAST  SURGERY Right 05-16-14 benign  CHOLECYSTECTOMY N/A 05/18/2019 Procedure: LAPAROSCOPIC CHOLECYSTECTOMY;  Surgeon: Benjamine Sprague, DO;  Location: [facility] ORS;  Service: General;  Laterality: N/A;  COLONOSCOPY 2013 Dr. Candace Cruise  Family History:  Family History Problem Relation Age of Onset  Liver cancer Mother  Cancer Mother     liver  Thyroid disease Mother  Kidney disease Father  Alcohol abuse Father  Other Brother     auto accident  Diabetes Maternal Aunt  Breast cancer Maternal Aunt  Cancer Maternal Grandmother     breast  Hypertension Paternal Grandmother  Kidney disease Paternal Grandfather  Hypertension Paternal Grandfather  Family Psychiatric  History: see above Social History:  Social History  Substance and Sexual Activity Alcohol Use Not Currently  Alcohol/week: 1.0 standard drink  Types: 1 Glasses of wine per week Comment: last use 03/2021    Social History  Substance and Sexual Activity Drug Use Never    Additional Social History:    Allergies:   Allergies Allergen Reactions  Cashew Nut Oil Shortness Of Breath  Shellfish Allergy Shortness Of Breath  Asa [Aspirin]  Intestinal bleeding   Labs:  Results for orders placed or performed during the hospital encounter of 06/20/21 (from the past 48 hour(s)) CBG monitoring, ED     Status: Abnormal  Collection Time: 06/20/21 10:36 AM Result Value Ref Range  Glucose-Capillary 320 (H) 70 - 99 mg/dL  Comment: Glucose reference range applies only to samples taken after fasting for at least 8 hours. Basic metabolic panel     Status: Abnormal  Collection Time: 06/20/21 10:46 AM Result  Value Ref Range  Sodium 137 135 - 145 mmol/L  Potassium 3.8 3.5 - 5.1 mmol/L  Chloride 106 98 - 111 mmol/L  CO2 24 22 - 32 mmol/L  Glucose, Bld 349 (H) 70 - 99 mg/dL  Comment: Glucose reference range applies only to samples taken after fasting for at least 8  hours.  BUN 13 6 - 20 mg/dL  Creatinine, Ser 0.66 0.44 - 1.00 mg/dL  Calcium 8.8 (L) 8.9 - 10.3 mg/dL  GFR, Estimated >60 >60 mL/min  Comment: (NOTE) Calculated using the CKD-EPI Creatinine Equation (2021)  Anion gap 7 5 - 15  Comment: Performed at [facility], [address] [place], [place] [address] CBC     Status: None  Collection Time: 06/20/21 10:46 AM Result Value Ref Range  WBC 7.0 4.0 - 10.5 K/uL  RBC 4.96 3.87 - 5.11 MIL/uL  Hemoglobin 14.6 12.0 - 15.0 g/dL  HCT 44.0 36.0 - 46.0 %  MCV 88.7 80.0 - 100.0 fL  MCH 29.4 26.0 - 34.0 pg  MCHC 33.2 30.0 - 36.0 g/dL  RDW 13.2 11.5 - 15.5 %  Platelets 288 150 - 400 K/uL  nRBC 0.0 0.0 - 0.2 %  Comment: Performed at [facility], [place], [place] [address] Troponin I (High Sensitivity)     Status: None  Collection Time: 06/20/21 10:46 AM Result Value Ref Range  Troponin I (High Sensitivity) 7 <18 ng/L  Comment: (NOTE) Elevated high sensitivity troponin I (hsTnI) values and significant  changes across serial measurements may suggest ACS but many other  chronic and acute conditions are known to elevate hsTnI results.  Refer to the "Links" section for chest pain algorithms and additional  guidance. Performed at [facility], [address] [place], [place] [address] CBG monitoring, ED     Status: Abnormal  Collection Time: 06/20/21  6:24 PM Result Value Ref Range  Glucose-Capillary 141 (H) 70 - 99 mg/dL  Comment: Glucose reference range applies only to samples taken after fasting for at least 8 hours. Resp Panel by RT-PCR (Flu A&B, Covid) Nasopharyngeal Swab     Status: None  Collection Time: 06/20/21  7:46 PM  Specimen: Nasopharyngeal Swab; Nasopharyngeal(NP) swabs in vial transport medium Result Value Ref Range  SARS Coronavirus 2 by RT PCR NEGATIVE NEGATIVE  Comment: (NOTE) SARS-CoV-2 target  nucleic acids are NOT DETECTED.  The SARS-CoV-2 RNA is generally detectable in upper respiratory specimens during the acute phase of infection. The lowest concentration of SARS-CoV-2 viral copies this assay can detect is 138 copies/mL. A negative result does not preclude SARS-Cov-2 infection and should not be used as the sole basis for treatment or other patient management decisions. A negative result may occur with  improper specimen collection/handling, submission of specimen other than nasopharyngeal swab, presence of viral mutation(s) within the areas targeted by this assay, and inadequate number of viral copies(<138 copies/mL). A negative result must be combined with clinical observations, patient history, and epidemiological information. The expected result is Negative.  Fact Sheet for Patients:  EntrepreneurPulse.com.au  Fact Sheet for Healthcare Providers:  IncredibleEmployment.be  This test is no t yet approved or cleared by the [place] FDA and  has been authorized for detection and/or diagnosis of SARS-CoV-2 by FDA under an Emergency Use Authorization (EUA). This EUA will remain  in effect (meaning this test can be used) for the duration of the COVID-19 declaration under Section 564(b)(1) of the Act, 21 U.S.C.section 360bbb-3(b)(1), unless the authorization is terminated  or revoked sooner.   Influenza A by PCR NEGATIVE NEGATIVE  Influenza B by PCR NEGATIVE NEGATIVE  Comment: (NOTE) The Xpert Xpress SARS-CoV-2/FLU/RSV plus assay is intended as an aid in the diagnosis of influenza from Nasopharyngeal swab specimens and should not be used as a sole basis for treatment. Nasal washings and aspirates are unacceptable for Xpert Xpress SARS-CoV-2/FLU/RSV testing.  Fact Sheet for Patients: EntrepreneurPulse.com.au  Fact Sheet for Healthcare Providers: IncredibleEmployment.be  This test is  not yet approved or cleared by the [place] FDA and has been authorized for detection and/or diagnosis of SARS-CoV-2 by FDA under an Emergency Use Authorization (EUA). This EUA will remain in effect (meaning this test can be used) for the duration of the COVID-19 declaration under Section 564(b)(1) of the Act, 21 U.S.C. section 360bbb-3(b)(1), unless the authorization is terminated or revoked.  Performed at [facility], [address] [place], [place] [address] Glucose, capillary     Status: Abnormal  Collection Time: 06/20/21  9:26 PM Result Value Ref Range  Glucose-Capillary 170 (H) 70 - 99 mg/dL  Comment: Glucose reference range applies only to samples taken after fasting for at least 8 hours. Glucose, capillary     Status: Abnormal  Collection Time: 06/21/21  7:23 AM Result Value Ref Range  Glucose-Capillary 140 (H) 70 - 99 mg/dL  Comment: Glucose reference range applies only to samples taken after fasting for at least 8 hours. Glucose, capillary     Status: Abnormal  Collection Time: 06/21/21 11:39 AM Result Value Ref Range  Glucose-Capillary 182 (H) 70 - 99 mg/dL  Comment: Glucose reference range applies only to samples taken after fasting for at least 8 hours.   Current Facility-Administered Medications Medication Dose Route Frequency Provider Last Rate Last Admin  acetaminophen (TYLENOL) tablet 650 mg  650 mg Oral Q6H PRN Ivor Costa, MD      ALPRAZolam Duanne Moron) tablet 0.25 mg  0.25 mg Oral BID PRN Lorella Nimrod, MD   0.25 mg at 06/21/21 1143  atorvastatin (LIPITOR) tablet 40 mg  40 mg Oral Daily Ivor Costa, MD   40 mg at 06/21/21 1001  enoxaparin (LOVENOX) injection 40 mg  40 mg Subcutaneous Q24H Ivor Costa, MD   40 mg at 06/20/21 2133  hydrALAZINE (APRESOLINE) injection 5 mg  5 mg Intravenous Q2H PRN Ivor Costa, MD      hydrALAZINE (APRESOLINE) tablet 50 mg  50 mg Oral TID Ivor Costa, MD    50 mg at 06/21/21 1002  insulin aspart (novoLOG) injection 0-5 Units  0-5 Units Subcutaneous QHS Ivor Costa, MD      insulin aspart (novoLOG) injection 0-9 Units  0-9 Units Subcutaneous TID WC Ivor Costa, MD   2 Units at 06/21/21 1146  insulin glargine-yfgn (SEMGLEE) injection 25 Units  25 Units Subcutaneous QHS Ivor Costa, MD   25 Units at 06/20/21 2133  levETIRAcetam (KEPPRA) tablet 500 mg  500 mg Oral BID Ivor Costa, MD   500 mg at 06/21/21 1005  losartan (COZAAR) tablet 100 mg  100 mg Oral Daily Ivor Costa, MD   100 mg at 06/21/21 1000  metoprolol tartrate (LOPRESSOR) tablet 75 mg  75 mg Oral Daily Ivor Costa, MD   75 mg at 06/21/21 1001  ondansetron (ZOFRAN) injection 4 mg  4 mg Intravenous Q8H PRN Ivor Costa, MD      sertraline (ZOLOFT) tablet 100 mg  100 mg Oral Daily Ivor Costa, MD   100 mg at 06/21/21 1000   Musculoskeletal: Strength &  Muscle Tone: decreased Gait & Station:  did not witness Patient leans: N/A  Psychiatric Specialty Exam: Physical Exam Vitals and nursing note reviewed.  Constitutional:      Appearance: Normal appearance.  HENT:     Head: Normocephalic.     Nose: Nose normal.  Pulmonary:     Effort: Pulmonary effort is normal.  Musculoskeletal:     Cervical back: Normal range of motion.  Neurological:     General: No focal deficit present.     Mental Status: She is alert and oriented to person, place, and time.  Psychiatric:        Attention and Perception: Attention and perception normal.        Mood and Affect: Mood is depressed.        Speech: Speech normal.        Behavior: Behavior normal. Behavior is cooperative.        Thought Content: Thought content normal.        Cognition and Memory: Cognition and memory normal.        Judgment: Judgment normal.  Review of Systems  Psychiatric/Behavioral:  Positive for depression.   All other systems reviewed and are negative. Blood pressure (!)  149/84, pulse 62, temperature 98.2 F (36.8 C), temperature source Oral, resp. rate 16, height 5\' 5"  (1.651 m), weight 86.2 kg, SpO2 100 %.Body mass index is 31.62 kg/m. General Appearance: Casual Eye Contact:  Good Speech:  Normal Rate Volume:  Normal Mood:  Depressed Affect:  Non-Congruent Thought Process:  Coherent and Descriptions of Associations: Intact Orientation:  Full (Time, Place, and Person) Thought Content:  WDL and Logical Suicidal Thoughts:  No Homicidal Thoughts:  No Memory:  Immediate;   Good Recent;   Good Remote;   Good Judgement:  Good Insight:  Good Psychomotor Activity:  Decreased Concentration:  Concentration: Good and Attention Span: Good Recall:  Good Fund of Knowledge:  Good Language:  Good Akathisia:  No Handed:  Right AIMS (if indicated):    Assets:  Housing Leisure Time Physical Health Resilience Social Support ADL's:  Intact Cognition:  WNL Sleep:   good  Physical Exam: Physical Exam Vitals and nursing note reviewed.  Constitutional:      Appearance: Normal appearance.  HENT:     Head: Normocephalic.     Nose: Nose normal.  Pulmonary:     Effort: Pulmonary effort is normal.  Musculoskeletal:     Cervical back: Normal range of motion.  Neurological:     General: No focal deficit present.     Mental Status: She is alert and oriented to person, place, and time.  Psychiatric:        Attention and Perception: Attention and perception normal.        Mood and Affect: Mood is depressed.        Speech: Speech normal.        Behavior: Behavior normal. Behavior is cooperative.        Thought Content: Thought content normal.        Cognition and Memory: Cognition and memory normal.        Judgment: Judgment normal.   Review of Systems  Psychiatric/Behavioral:  Positive for depression.   All other systems reviewed and are negative. Blood pressure (!) 149/84, pulse 62, temperature 98.2 F (36.8 C), temperature source Oral, resp. rate 16,  height 5\' 5"  (1.651 m), weight 86.2 kg, SpO2 100 %. Body mass index is 31.62 kg/m.  Treatment Plan Summary: Adjustment disorder with  mixed disturbance of depression and anxiety: -continue Zoloft 100 mg daily -Started gabapentin 100 mg BID PRN -Follow up with [facility]  Disposition: No evidence of imminent risk to self or others at present.   Patient does not meet criteria for psychiatric inpatient admission. Supportive therapy provided about ongoing stressors.  Waylan Boga, NP 06/21/2021 2:27 PM

## 2021-06-21 NOTE — Progress Notes (Signed)
OT Cancellation Note  Patient Details Name: Kerry Webb MRN: [id number] DOB: 11-30-1965   Cancelled Treatment:    Reason Eval/Treat Not Completed: Medical issues which prohibited therapy;Other (comment). OT order received. Chart reviewed. Spoke with physical therapist who just finished working with pt and reported pt became hypertensive/symptomatic with minimal functional activity. Unable to fully participate/benefit from OT evaluation/treatment at this time. Will hold and re-attempt at a later date/time as available and pt medically appropriate for OT services.   Shara Blazing, M.S., OTR/L [facility] Ascom: [phone] 06/21/21, 10:05 AM

## 2021-06-21 NOTE — Discharge Summary (Addendum)
Physician Discharge Summary  Kerry Webb ZDG:[id number] DOB: [birth year] DOA: 06/20/2021  PCP: Langston Reusing, NP  Admit date: 06/20/2021 Discharge date: 06/21/2021  Admitted From: Home Disposition: Home  Recommendations for Outpatient Follow-up:  Follow up with PCP in 1-2 weeks Follow-up with psychiatry Please obtain BMP/CBC in one week Please follow up on the following pending results: None  Home Health: no Equipment/Devices: None Discharge Condition: Stable CODE STATUS: Full Diet recommendation: Heart Healthy / Carb Modified   Brief/Interim Summary:  Kerry Webb is a 55 y.o. female with medical history significant of hypertension, hyperlipidemia, diabetes mellitus, GERD, depression, possible seizure, who presents with loss of consciousness.   Pt states that ever since she was 55 years old, has been having intermittent syncope episode.  No clear diagnosis was made.  It was thought possibly due to seizure.  She is taking Keppra for possible seizure, but states that seizure diagnosis is questionable. Pt was recently hospitalized, and had extensive work-up by neurologist and cardiology.  All test were unremarkable, including MRI of brain, carotid artery ultrasound, EEG and overnight EEG, 2D echo. She also had cardiac monitor from 11/14 to 12/13 which was negative for critical or serious findings.   Patient continues to have couple of very short and transient syncopal episodes, recovering completely pretty quickly.  Orthostatic vitals remain negative.  No other significant abnormality noted. Psychiatry was also consulted.  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  She recently lost her job and experiencing financial difficulties.  We had a long discussion with patient.  She had a outpatient psych appointment on 07/11/2021.  She will get benefit from psychotherapy.  She was given Zoloft from her PCP.  Our psychiatrist  started her on gabapentin for anxiety.  All of her current labs and imaging was unremarkable.  We decreased the dose of metoprolol to 50 mg daily, heart rate mostly in 50s and low 60s.  She will continue with rest of her home medications and follow-up with her providers.  Discharge Diagnoses:  Principal Problem:   Syncope Active Problems:   History of seizure   Diabetes mellitus without complication ([place])   Hypertension   HLD (hyperlipidemia)   Depression   Adjustment disorder with mixed anxiety and depressed mood   Discharge Instructions  Discharge Instructions     Diet - low sodium heart healthy   Complete by: As directed    Discharge instructions   Complete by: As directed    It was pleasure taking care of you. 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 We decreased the dose of metoprolol because of borderline heart rate and also added Xanax to be used only when you are feeling very anxious. Please keep yourself well-hydrated and follow-up with your psychiatrist as an outpatient for further recommendations. Follow-up with your primary care doctor.   Increase activity slowly   Complete by: As directed       Allergies as of 06/21/2021       Reactions   Cashew Nut Oil Shortness Of Breath   Shellfish Allergy Shortness Of Breath   Asa [aspirin]    Intestinal bleeding        Medication List     TAKE these medications    atorvastatin 40 MG tablet Commonly known as: LIPITOR Take 1 tablet (40 mg total) by mouth once daily.   Basaglar KwikPen 100 UNIT/ML Inject 30 Units into the skin once daily at  bedtime. (Replaces Lantus)   blood glucose meter kit and supplies Kit USE AS DIRECTED TO CHECK BLOOD SUGAR UP TO 4 TIMES DAILY.   Farxiga 10 MG Tabs tablet Generic drug: dapagliflozin propanediol Take 1 tablet (10 mg total) by mouth once every morning.   gabapentin 100 MG capsule Commonly known as: NEURONTIN Take 1 capsule (100 mg  total) by mouth 2 (two) times daily as needed (anxiety).   hydrALAZINE 50 MG tablet Commonly known as: APRESOLINE Take 1 tablet (50 mg total) by mouth 3 (three) times daily.   Insulin Pen Needle 32G X 4 MM Misc Use as directed with insulin pen   levETIRAcetam 500 MG tablet Commonly known as: KEPPRA Take 1 tablet (500 mg total) by mouth 2 (two) times daily.   losartan 100 MG tablet Commonly known as: COZAAR Take 1 tablet (100 mg total) by mouth once daily.   metFORMIN 500 MG 24 hr tablet Commonly known as: GLUCOPHAGE-XR Take 1 tablet (500 mg total) by mouth once daily with breakfast.   metoprolol tartrate 50 MG tablet Commonly known as: LOPRESSOR Take 1 tablet (50 mg total) by mouth daily. What changed:  how much to take when to take this   Rightest GL300 Lancets Misc USE AS DIRECTED TO CHECK BLOOD SUGAR UP TO 4 TIMES DAILY.   Rightest GS550 Blood Glucose test strip Generic drug: glucose blood USE AS DIRECTED TO CHECK BLOOD SUGAR UP TO 4 TIMES DAILY.   sertraline 100 MG tablet Commonly known as: ZOLOFT Take 1 tablet (100 mg total) by mouth once daily.        Follow-up Information     Iloabachie, Chioma E, NP. Schedule an appointment as soon as possible for a visit .   Specialty: Gerontology Contact information: [address] [place] [address] [phone]                Allergies  Allergen Reactions   Cashew Nut Oil Shortness Of Breath   Shellfish Allergy Shortness Of Breath   Asa [Aspirin]     Intestinal bleeding    Consultations: Psychiatry  Procedures/Studies: CT HEAD WO CONTRAST (5MM)  Result Date: 06/20/2021 CLINICAL DATA:  Syncope, diabetes, possible seizures, hypertension EXAM: CT HEAD WITHOUT CONTRAST TECHNIQUE: Contiguous axial images were obtained from the base of the skull through the vertex without intravenous contrast. COMPARISON:  05/04/2021, 04/30/2021 FINDINGS: Brain: No acute infarct or hemorrhage. Lateral  ventricles and midline structures are unremarkable. No acute extra-axial fluid collections. No mass effect. Vascular: No hyperdense vessel or unexpected calcification. Stable atherosclerosis. Skull: Normal. Negative for fracture or focal lesion. Sinuses/Orbits: No acute finding. Other: None. IMPRESSION: 1. Stable head CT, no acute intracranial process. Electronically Signed   By: Randa Ngo M.D.   On: 06/20/2021 17:33    Subjective: Patient was seen and examined today.  She was quite tearful stating that she is having a lot of life stresses.  Later she had another very transient syncopal episode when she was trying to get out of bed, negative orthostatic vitals.  Nursing staff was able to help but she did touches the floor, no injuries.  She regained her consciousness very quickly.  She was immediately alert and oriented and talking.  No seizure-like activity noted.  No postictal state.  Discharge Exam: Vitals:   06/21/21 1544 06/21/21 1554  BP:  139/84  Pulse: 65 (!) 57  Resp: 16 16  Temp: 98.3 F (36.8 C) 98.8 F (37.1 C)  SpO2: 99% 98%  Vitals:   06/21/21 1400 06/21/21 1500 06/21/21 1544 06/21/21 1554  BP: (!) 154/88 139/90  139/84  Pulse: 68 65 65 (!) 57  Resp: (!) 21 18 16 16   Temp:   98.3 F (36.8 C) 98.8 F (37.1 C)  TempSrc:   Oral Oral  SpO2: 100% 99% 99% 98%  Weight:      Height:        General: Pt is alert, awake, not in acute distress Cardiovascular: RRR, S1/S2 +, no rubs, no gallops Respiratory: CTA bilaterally, no wheezing, no rhonchi Abdominal: Soft, NT, ND, bowel sounds + Extremities: no edema, no cyanosis   The results of significant diagnostics from this hospitalization (including imaging, microbiology, ancillary and laboratory) are listed below for reference.    Microbiology: Recent Results (from the past 240 hour(s))  Resp Panel by RT-PCR (Flu A&B, Covid) Nasopharyngeal Swab     Status: None   Collection Time: 06/20/21  7:46 PM   Specimen:  Nasopharyngeal Swab; Nasopharyngeal(NP) swabs in vial transport medium  Result Value Ref Range Status   SARS Coronavirus 2 by RT PCR NEGATIVE NEGATIVE Final    Comment: (NOTE) SARS-CoV-2 target nucleic acids are NOT DETECTED.  The SARS-CoV-2 RNA is generally detectable in upper respiratory specimens during the acute phase of infection. The lowest concentration of SARS-CoV-2 viral copies this assay can detect is 138 copies/mL. A negative result does not preclude SARS-Cov-2 infection and should not be used as the sole basis for treatment or other patient management decisions. A negative result may occur with  improper specimen collection/handling, submission of specimen other than nasopharyngeal swab, presence of viral mutation(s) within the areas targeted by this assay, and inadequate number of viral copies(<138 copies/mL). A negative result must be combined with clinical observations, patient history, and epidemiological information. The expected result is Negative.  Fact Sheet for Patients:  EntrepreneurPulse.com.au  Fact Sheet for Healthcare Providers:  IncredibleEmployment.be  This test is no t yet approved or cleared by the [place] FDA and  has been authorized for detection and/or diagnosis of SARS-CoV-2 by FDA under an Emergency Use Authorization (EUA). This EUA will remain  in effect (meaning this test can be used) for the duration of the COVID-19 declaration under Section 564(b)(1) of the Act, 21 U.S.C.section 360bbb-3(b)(1), unless the authorization is terminated  or revoked sooner.       Influenza A by PCR NEGATIVE NEGATIVE Final   Influenza B by PCR NEGATIVE NEGATIVE Final    Comment: (NOTE) The Xpert Xpress SARS-CoV-2/FLU/RSV plus assay is intended as an aid in the diagnosis of influenza from Nasopharyngeal swab specimens and should not be used as a sole basis for treatment. Nasal washings and aspirates are unacceptable for  Xpert Xpress SARS-CoV-2/FLU/RSV testing.  Fact Sheet for Patients: EntrepreneurPulse.com.au  Fact Sheet for Healthcare Providers: IncredibleEmployment.be  This test is not yet approved or cleared by the [place] FDA and has been authorized for detection and/or diagnosis of SARS-CoV-2 by FDA under an Emergency Use Authorization (EUA). This EUA will remain in effect (meaning this test can be used) for the duration of the COVID-19 declaration under Section 564(b)(1) of the Act, 21 U.S.C. section 360bbb-3(b)(1), unless the authorization is terminated or revoked.  Performed at [facility], [address] [place], [place] [address]      Labs: BNP (last 3 results) No results for input(s): BNP in the last 8760 hours. Basic Metabolic Panel: Recent Labs  Lab 06/20/21 1046  NA 137  K 3.8  CL 106  CO2 24  GLUCOSE 349*  BUN 13  CREATININE 0.66  CALCIUM 8.8*   Liver Function Tests: No results for input(s): AST, ALT, ALKPHOS, BILITOT, PROT, ALBUMIN in the last 168 hours. No results for input(s): LIPASE, AMYLASE in the last 168 hours. No results for input(s): AMMONIA in the last 168 hours. CBC: Recent Labs  Lab 06/20/21 1046  WBC 7.0  HGB 14.6  HCT 44.0  MCV 88.7  PLT 288   Cardiac Enzymes: No results for input(s): CKTOTAL, CKMB, CKMBINDEX, TROPONINI in the last 168 hours. BNP: Invalid input(s): POCBNP CBG: Recent Labs  Lab 06/20/21 1824 06/20/21 2126 06/21/21 0723 06/21/21 1139 06/21/21 1601  GLUCAP 141* 170* 140* 182* 173*   D-Dimer No results for input(s): DDIMER in the last 72 hours. Hgb A1c No results for input(s): HGBA1C in the last 72 hours. Lipid Profile Recent Labs    06/19/21 1431  CHOL 256*  HDL 34*  LDLCALC 160*  TRIG 325*  CHOLHDL 7.5*   Thyroid function studies No results for input(s): TSH, T4TOTAL, T3FREE, THYROIDAB in the last 72 hours.  Invalid input(s): FREET3 Anemia work up No  results for input(s): VITAMINB12, FOLATE, FERRITIN, TIBC, IRON, RETICCTPCT in the last 72 hours. Urinalysis    Component Value Date/Time   COLORURINE YELLOW (A) 05/02/2021 1030   APPEARANCEUR CLEAR (A) 05/02/2021 1030   APPEARANCEUR Hazy 09/21/2012 2055   LABSPEC 1.025 05/02/2021 1030   LABSPEC 1.013 09/21/2012 2055   PHURINE 7.0 05/02/2021 1030   GLUCOSEU >=500 (A) 05/02/2021 1030   GLUCOSEU Negative 09/21/2012 2055   HGBUR NEGATIVE 05/02/2021 1030   BILIRUBINUR NEGATIVE 05/02/2021 1030   BILIRUBINUR Negative 09/21/2012 2055   KETONESUR NEGATIVE 05/02/2021 1030   PROTEINUR NEGATIVE 05/02/2021 1030   NITRITE NEGATIVE 05/02/2021 1030   LEUKOCYTESUR NEGATIVE 05/02/2021 1030   LEUKOCYTESUR Negative 09/21/2012 2055   Sepsis Labs Invalid input(s): PROCALCITONIN,  WBC,  LACTICIDVEN Microbiology Recent Results (from the past 240 hour(s))  Resp Panel by RT-PCR (Flu A&B, Covid) Nasopharyngeal Swab     Status: None   Collection Time: 06/20/21  7:46 PM   Specimen: Nasopharyngeal Swab; Nasopharyngeal(NP) swabs in vial transport medium  Result Value Ref Range Status   SARS Coronavirus 2 by RT PCR NEGATIVE NEGATIVE Final    Comment: (NOTE) SARS-CoV-2 target nucleic acids are NOT DETECTED.  The SARS-CoV-2 RNA is generally detectable in upper respiratory specimens during the acute phase of infection. The lowest concentration of SARS-CoV-2 viral copies this assay can detect is 138 copies/mL. A negative result does not preclude SARS-Cov-2 infection and should not be used as the sole basis for treatment or other patient management decisions. A negative result may occur with  improper specimen collection/handling, submission of specimen other than nasopharyngeal swab, presence of viral mutation(s) within the areas targeted by this assay, and inadequate number of viral copies(<138 copies/mL). A negative result must be combined with clinical observations, patient history, and  epidemiological information. The expected result is Negative.  Fact Sheet for Patients:  EntrepreneurPulse.com.au  Fact Sheet for Healthcare Providers:  IncredibleEmployment.be  This test is no t yet approved or cleared by the [place] FDA and  has been authorized for detection and/or diagnosis of SARS-CoV-2 by FDA under an Emergency Use Authorization (EUA). This EUA will remain  in effect (meaning this test can be used) for the duration of the COVID-19 declaration under Section 564(b)(1) of the Act, 21 U.S.C.section 360bbb-3(b)(1), unless the authorization is terminated  or revoked sooner.  Influenza A by PCR NEGATIVE NEGATIVE Final   Influenza B by PCR NEGATIVE NEGATIVE Final    Comment: (NOTE) The Xpert Xpress SARS-CoV-2/FLU/RSV plus assay is intended as an aid in the diagnosis of influenza from Nasopharyngeal swab specimens and should not be used as a sole basis for treatment. Nasal washings and aspirates are unacceptable for Xpert Xpress SARS-CoV-2/FLU/RSV testing.  Fact Sheet for Patients: EntrepreneurPulse.com.au  Fact Sheet for Healthcare Providers: IncredibleEmployment.be  This test is not yet approved or cleared by the [place] FDA and has been authorized for detection and/or diagnosis of SARS-CoV-2 by FDA under an Emergency Use Authorization (EUA). This EUA will remain in effect (meaning this test can be used) for the duration of the COVID-19 declaration under Section 564(b)(1) of the Act, 21 U.S.C. section 360bbb-3(b)(1), unless the authorization is terminated or revoked.  Performed at [facility], [address] [place], [place] [address]     Time coordinating discharge: Over 30 minutes  SIGNED:  Lorella Nimrod, MD  [facility] 06/21/2021, 5:12 PM  If 7PM-7AM, please contact night-coverage [email or web address]  This record has been created using Actor. Errors have been sought and corrected,but may not always be located. Such creation errors do not reflect on the standard of care.

## 2021-06-21 NOTE — Significant Event (Signed)
Rapid Response Event Note   Reason for Call : called for rapid response on pt who keeps having syncopal events.    Initial Focused Assessment: Laying in bed, alert and oriented x 4, Hypertensive on monitor. Bedside RN reports got pt up to bedside commode, and pt immediately passed out.       Interventions: Dr Reesa Chew to bedside... will look over meds, and consult cardio. PRN xanax ordered.   Plan of Care: as above, RN Cody to call for further assistance.    Event Summary: as above  MD Notified: Dr Reesa Chew [address] [place], RN

## 2021-06-21 NOTE — Progress Notes (Signed)
RN reached out for additional input on discharge plans for patient. Concerns addressed with MD regarding needed equipment and medication. Patient would be discharged home alone until Monday. Awaiting MD response regarding most appropriate and safe plan. Orma Flaming, RN

## 2021-06-22 LAB — GLUCOSE, CAPILLARY
Glucose-Capillary: 144 mg/dL — ABNORMAL HIGH (ref 70–99)
Glucose-Capillary: 175 mg/dL — ABNORMAL HIGH (ref 70–99)

## 2021-06-22 NOTE — Evaluation (Signed)
Occupational Therapy Evaluation Patient Details Name: Kerry Webb MRN: [id number] DOB: 30-Mar-1966 Today's Date: 06/22/2021   History of Present Illness Pt is a 55 y.o. female with medical history significant of hypertension, hyperlipidemia, diabetes mellitus, GERD, depression, possible seizures, who presents with loss of consciousness. MD assessment includes: syncope.   Clinical Impression   Pt seen for OT evaluation this date in setting of acute hospitalization d/t syncopal episode. She reports being INDEP at baseline. She requires CGA initially to stand, but progresses to SUPV with good control on all subsequent transfers completed this session including to/from commode. Pt is able to complete fxl mobility in the room with no AD with SUPV only, more for confidence than actual cueing or assistance as pt is fearful of falling. Ed re: monitoring for symptoms in sitting and standing before ambulating to prevent fall. BP checked throughout and pt with elevated BP which is listed below and reported to RN. Pt returned to bed with all needs met and in reach. Fall mat removed and BSC placed next to patient. Because her balance is G with standing and fxl mobility, pt is safe to pivot to commode, pt is educated to not walk distance until her BP is better controlled and pt is in agreement. More dynamic balance testing needs to occur, but was deferred d/t pt's elevated pressures. Anticipate she will require HHOT f/u to ensure safety with more dynamic tasks.     Recommendations for follow up therapy are one component of a multi-disciplinary discharge planning process, led by the attending physician.  Recommendations may be updated based on patient status, additional functional criteria and insurance authorization.   Follow Up Recommendations  Home health OT    Assistance Recommended at Discharge Set up Supervision/Assistance  Functional Status Assessment  Patient has had a recent decline in their  functional status and demonstrates the ability to make significant improvements in function in a reasonable and predictable amount of time.  Equipment Recommendations  Tub/shower seat    Recommendations for Other Services       Precautions / Restrictions Precautions Precautions: Fall Restrictions Weight Bearing Restrictions: No      Mobility Bed Mobility Overal bed mobility: Modified Independent                  Transfers Overall transfer level: Needs assistance Equipment used: None Transfers: Sit to/from Stand Sit to Stand: Supervision           General transfer comment: no physical assist, good control, no LOB      Balance Overall balance assessment: Needs assistance Sitting-balance support: Bilateral upper extremity supported;Feet supported Sitting balance-Leahy Scale: Normal     Standing balance support: Bilateral upper extremity supported;During functional activity Standing balance-Leahy Scale: Good Standing balance comment: Pt with no c/o dizziness, but BP continues to be elevated, taken twice 168/111, then 187/113. RN notified.                           ADL either performed or assessed with clinical judgement   ADL                                         General ADL Comments: Pt able to perform seated UB/LB ADLs with INDEP, and SUPV for standing ADLs with no AD     Vision Patient Visual Report: No change from baseline  Perception     Praxis      Pertinent Vitals/Pain Pain Assessment: No/denies pain     Hand Dominance     Extremity/Trunk Assessment Upper Extremity Assessment Upper Extremity Assessment: Overall WFL for tasks assessed   Lower Extremity Assessment Lower Extremity Assessment: Overall WFL for tasks assessed       Communication Communication Communication: No difficulties   Cognition Arousal/Alertness: Awake/alert Behavior During Therapy: WFL for tasks assessed/performed Overall  Cognitive Status: Within Functional Limits for tasks assessed                                       General Comments       Exercises Other Exercises Other Exercises: Ed re: dynamic balance safety including into/out of bathtub since pt reports she prefers to take baths.   Shoulder Instructions      Home Living Family/patient expects to be discharged to:: Private residence Living Arrangements: Alone Available Help at Discharge: Friend(s);Available PRN/intermittently   Home Access: Stairs to enter Entrance Stairs-Number of Steps: 2 Entrance Stairs-Rails: Right;Left Home Layout: One level     Bathroom Shower/Tub: Teacher, early years/pre: Standard     Home Equipment: None          Prior Functioning/Environment Prior Level of Function : Independent/Modified Independent;Driving             Mobility Comments: Pt reports being independent with mobility, no AD, does not report falls but has had multiple LOB bouts and dizzy spells ADLs Comments: Pt reports being independent with ADLs. Of note, pt does report that she is not technically cleared to start driving yet (since TIA), but has limited social support and does have to at times.        OT Problem List: Decreased activity tolerance      OT Treatment/Interventions: Self-care/ADL training;Therapeutic exercise;DME and/or AE instruction    OT Goals(Current goals can be found in the care plan section) Acute Rehab OT Goals Patient Stated Goal: to go home and get back to work OT Goal Formulation: With patient Time For Goal Achievement: 07/06/21 Potential to Achieve Goals: Good ADL Goals Pt Will Perform Lower Body Dressing: with modified independence;sit to/from stand Pt Will Transfer to Toilet: with modified independence;ambulating Pt Will Perform Toileting - Clothing Manipulation and hygiene: with modified independence;sit to/from stand  OT Frequency: Min 2X/week   Barriers to D/C:             Co-evaluation              AM-PAC OT "6 Clicks" Daily Activity     Outcome Measure Help from another person eating meals?: None Help from another person taking care of personal grooming?: A Little Help from another person toileting, which includes using toliet, bedpan, or urinal?: A Lot Help from another person bathing (including washing, rinsing, drying)?: A Lot Help from another person to put on and taking off regular upper body clothing?: A Little Help from another person to put on and taking off regular lower body clothing?: A Lot 6 Click Score: 16   End of Session Equipment Utilized During Treatment: Gait belt Nurse Communication: Mobility status  Activity Tolerance: Patient tolerated treatment well Patient left: in bed;with call bell/phone within reach  OT Visit Diagnosis: History of falling (Z91.81);Muscle weakness (generalized) (M62.81)                Time: 1517-6160  OT Time Calculation (min): 43 min Charges:  OT General Charges $OT Visit: 1 Visit OT Evaluation $OT Eval Moderate Complexity: 1 Mod OT Treatments $Self Care/Home Management : 8-22 mins $Therapeutic Activity: 8-22 mins  Gerrianne Scale, MS, OTR/L ascom [phone] 06/22/21, 1:04 PM

## 2021-06-22 NOTE — Progress Notes (Signed)
Patient was discharged yesterday.  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 We obtained psych evaluation and she was started on gabapentin for anxiety. Nursing staff does not think that she is a safe discharge home even though I explained multiple times. They did not let her go yesterday. Talked with our administration and hopefully will be leaving today.  No medical need to keep her in the hospital.

## 2021-06-22 NOTE — TOC Progression Note (Signed)
Transition of Care [facility]) - Progression Note    Patient Details  Name: Kerry Webb MRN: [id number] Date of Birth: Dec 19, 1965  Transition of Care [facility]) CM/SW [place], RN Phone Number: 06/22/2021, 8:46 AM  Clinical Narrative:   Met with the patient in the room to discuss DC planning and needs She stated that she lives alone, [facility] and gets [facility] medication at [facility], She stated that she doe snot need any DME equipment, I offered to set up [facility] PT thru charity and she declined, she stated that she has no needs and she will be fine, I encouraged her to let me know if she has a need prior to DC         Expected Discharge Plan and Services           Expected Discharge Date: 06/21/21                                     Social Determinants of Health (SDOH) Interventions    Readmission Risk Interventions No flowsheet data found.

## 2021-06-24 ENCOUNTER — Other Ambulatory Visit: Payer: Self-pay

## 2021-06-24 ENCOUNTER — Ambulatory Visit: Payer: Self-pay

## 2021-06-27 NOTE — Progress Notes (Signed)
Completed [facility] application and contract.  Patient agreed to all terms of the [facility] contract.    Patient approved to receive medication assistance at [facility] until time for re-certification in 2023, and as long as eligibility criteria continues to be met.    Provided patient with community resource material based on her particular needs.    Norvel Wenker J. Bladen Umar Care Manager [facility]  

## 2021-07-09 ENCOUNTER — Other Ambulatory Visit: Payer: Self-pay

## 2021-07-09 ENCOUNTER — Encounter: Payer: Self-pay | Admitting: Gerontology

## 2021-07-09 ENCOUNTER — Ambulatory Visit: Payer: Self-pay | Admitting: Gerontology

## 2021-07-09 VITALS — BP 147/98 | HR 74 | Temp 97.4°F | Resp 16 | Ht 65.0 in | Wt 186.5 lb

## 2021-07-09 DIAGNOSIS — I10 Essential (primary) hypertension: Secondary | ICD-10-CM

## 2021-07-09 DIAGNOSIS — Z09 Encounter for follow-up examination after completed treatment for conditions other than malignant neoplasm: Secondary | ICD-10-CM

## 2021-07-09 DIAGNOSIS — Z87898 Personal history of other specified conditions: Secondary | ICD-10-CM

## 2021-07-09 DIAGNOSIS — E119 Type 2 diabetes mellitus without complications: Secondary | ICD-10-CM

## 2021-07-09 DIAGNOSIS — E785 Hyperlipidemia, unspecified: Secondary | ICD-10-CM

## 2021-07-09 MED ORDER — LEVETIRACETAM 500 MG PO TABS
500.0000 mg | ORAL_TABLET | Freq: Two times a day (BID) | ORAL | 0 refills | Status: DC
  Filled 2021-07-09 – 2021-07-18 (×2): qty 60, 30d supply, fill #0

## 2021-07-09 NOTE — Patient Instructions (Signed)
DASH Eating Plan °DASH stands for Dietary Approaches to Stop Hypertension. The DASH eating plan is a healthy eating plan that has been shown to: °Reduce high blood pressure (hypertension). °Reduce your risk for type 2 diabetes, heart disease, and stroke. °Help with weight loss. °What are tips for following this plan? °Reading food labels °Check food labels for the amount of salt (sodium) per serving. Choose foods with less than 5 percent of the Daily Value of sodium. Generally, foods with less than 300 milligrams (mg) of sodium per serving fit into this eating plan. °To find whole grains, look for the word "whole" as the first word in the ingredient list. °Shopping °Buy products labeled as "low-sodium" or "no salt added." °Buy fresh foods. Avoid canned foods and pre-made or frozen meals. °Cooking °Avoid adding salt when cooking. Use salt-free seasonings or herbs instead of table salt or sea salt. Check with your health care provider or pharmacist before using salt substitutes. °Do not fry foods. Cook foods using healthy methods such as baking, boiling, grilling, roasting, and broiling instead. °Cook with heart-healthy oils, such as olive, canola, avocado, soybean, or sunflower oil. °Meal planning ° °Eat a balanced diet that includes: °4 or more servings of fruits and 4 or more servings of vegetables each day. Try to fill one-half of your plate with fruits and vegetables. °6-8 servings of whole grains each day. °Less than 6 oz (170 g) of lean meat, poultry, or fish each day. A 3-oz (85-g) serving of meat is about the same size as a deck of cards. One egg equals 1 oz (28 g). °2-3 servings of low-fat dairy each day. One serving is 1 cup (237 mL). °1 serving of nuts, seeds, or beans 5 times each week. °2-3 servings of heart-healthy fats. Healthy fats called omega-3 fatty acids are found in foods such as walnuts, flaxseeds, fortified milks, and eggs. These fats are also found in cold-water fish, such as sardines, salmon,  and mackerel. °Limit how much you eat of: °Canned or prepackaged foods. °Food that is high in trans fat, such as some fried foods. °Food that is high in saturated fat, such as fatty meat. °Desserts and other sweets, sugary drinks, and other foods with added sugar. °Full-fat dairy products. °Do not salt foods before eating. °Do not eat more than 4 egg yolks a week. °Try to eat at least 2 vegetarian meals a week. °Eat more home-cooked food and less restaurant, buffet, and fast food. °Lifestyle °When eating at a restaurant, ask that your food be prepared with less salt or no salt, if possible. °If you drink alcohol: °Limit how much you use to: °0-1 drink a day for women who are not pregnant. °0-2 drinks a day for men. °Be aware of how much alcohol is in your drink. In the [place] one drink equals one 12 oz bottle of beer (355 mL), one 5 oz glass of wine (148 mL), or one 1½ oz glass of hard liquor (44 mL). °General information °Avoid eating more than 2,300 mg of salt a day. If you have hypertension, you may need to reduce your sodium intake to 1,500 mg a day. °Work with your health care provider to maintain a healthy body weight or to lose weight. Ask what an ideal weight is for you. °Get at least 30 minutes of exercise that causes your heart to beat faster (aerobic exercise) most days of the week. Activities may include walking, swimming, or biking. °Work with your health care provider or dietitian to   adjust your eating plan to your individual calorie needs. °What foods should I eat? °Fruits °All fresh, dried, or frozen fruit. Canned fruit in natural juice (without added sugar). °Vegetables °Fresh or frozen vegetables (raw, steamed, roasted, or grilled). Low-sodium or reduced-sodium tomato and vegetable juice. Low-sodium or reduced-sodium tomato sauce and tomato paste. Low-sodium or reduced-sodium canned vegetables. °Grains °Whole-grain or whole-wheat bread. Whole-grain or whole-wheat pasta. Brown rice. Oatmeal. Quinoa.  Bulgur. Whole-grain and low-sodium cereals. Pita bread. Low-fat, low-sodium crackers. Whole-wheat flour tortillas. °Meats and other proteins °Skinless chicken or turkey. Ground chicken or turkey. Pork with fat trimmed off. Fish and seafood. Egg whites. Dried beans, peas, or lentils. Unsalted nuts, nut butters, and seeds. Unsalted canned beans. Lean cuts of beef with fat trimmed off. Low-sodium, lean precooked or cured meat, such as sausages or meat loaves. °Dairy °Low-fat (1%) or fat-free (skim) milk. Reduced-fat, low-fat, or fat-free cheeses. Nonfat, low-sodium ricotta or cottage cheese. Low-fat or nonfat yogurt. Low-fat, low-sodium cheese. °Fats and oils °Soft margarine without trans fats. Vegetable oil. Reduced-fat, low-fat, or light mayonnaise and salad dressings (reduced-sodium). Canola, safflower, olive, avocado, soybean, and sunflower oils. Avocado. °Seasonings and condiments °Herbs. Spices. Seasoning mixes without salt. °Other foods °Unsalted popcorn and pretzels. Fat-free sweets. °The items listed above may not be a complete list of foods and beverages you can eat. Contact a dietitian for more information. °What foods should I avoid? °Fruits °Canned fruit in a light or heavy syrup. Fried fruit. Fruit in cream or butter sauce. °Vegetables °Creamed or fried vegetables. Vegetables in a cheese sauce. Regular canned vegetables (not low-sodium or reduced-sodium). Regular canned tomato sauce and paste (not low-sodium or reduced-sodium). Regular tomato and vegetable juice (not low-sodium or reduced-sodium). Pickles. Olives. °Grains °Baked goods made with fat, such as croissants, muffins, or some breads. Dry pasta or rice meal packs. °Meats and other proteins °Fatty cuts of meat. Ribs. Fried meat. Bacon. Bologna, salami, and other precooked or cured meats, such as sausages or meat loaves. Fat from the back of a pig (fatback). Bratwurst. Salted nuts and seeds. Canned beans with added salt. Canned or smoked fish.  Whole eggs or egg yolks. Chicken or turkey with skin. °Dairy °Whole or 2% milk, cream, and half-and-half. Whole or full-fat cream cheese. Whole-fat or sweetened yogurt. Full-fat cheese. Nondairy creamers. Whipped toppings. Processed cheese and cheese spreads. °Fats and oils °Butter. Stick margarine. Lard. Shortening. Ghee. Bacon fat. Tropical oils, such as coconut, palm kernel, or palm oil. °Seasonings and condiments °Onion salt, garlic salt, seasoned salt, table salt, and sea salt. Worcestershire sauce. Tartar sauce. Barbecue sauce. Teriyaki sauce. Soy sauce, including reduced-sodium. Steak sauce. Canned and packaged gravies. Fish sauce. Oyster sauce. Cocktail sauce. Store-bought horseradish. Ketchup. Mustard. Meat flavorings and tenderizers. Bouillon cubes. Hot sauces. Pre-made or packaged marinades. Pre-made or packaged taco seasonings. Relishes. Regular salad dressings. °Other foods °Salted popcorn and pretzels. °The items listed above may not be a complete list of foods and beverages you should avoid. Contact a dietitian for more information. °Where to find more information °National Heart, Lung, and Blood Institute: www.nhlbi.nih.gov °American Heart Association: www.heart.org °Academy of Nutrition and Dietetics: www.eatright.org °National Kidney Foundation: www.kidney.org °Summary °The DASH eating plan is a healthy eating plan that has been shown to reduce high blood pressure (hypertension). It may also reduce your risk for type 2 diabetes, heart disease, and stroke. °When on the DASH eating plan, aim to eat more fresh fruits and vegetables, whole grains, lean proteins, low-fat dairy, and heart-healthy fats. °With the DASH   eating plan, you should limit salt (sodium) intake to 2,300 mg a day. If you have hypertension, you may need to reduce your sodium intake to 1,500 mg a day. Work with your health care provider or dietitian to adjust your eating plan to your individual calorie needs. This information is not  intended to replace advice given to you by your health care provider. Make sure you discuss any questions you have with your health care provider. Document Revised: 05/27/2019 Document Reviewed: 05/27/2019 Elsevier Patient Education  2022 [place] for Diabetes Mellitus, Adult Carbohydrate counting is a method of keeping track of how many carbohydrates you eat. Eating carbohydrates increases the amount of sugar (glucose) in the blood. Counting how many carbohydrates you eat improves how well you manage your blood glucose. This, in turn, helps you manage your diabetes. Carbohydrates are measured in grams (g) per serving. It is important to know how many carbohydrates (in grams or by serving size) you can have in each meal. This is different for every person. A dietitian can help you make a meal plan and calculate how many carbohydrates you should have at each meal and snack. What foods contain carbohydrates? Carbohydrates are found in the following foods: Grains, such as breads and cereals. Dried beans and soy products. Starchy vegetables, such as potatoes, peas, and corn. Fruit and fruit juices. Milk and yogurt. Sweets and snack foods, such as cake, cookies, candy, chips, and soft drinks. How do I count carbohydrates in foods? There are two ways to count carbohydrates in food. You can read food labels or learn standard serving sizes of foods. You can use either of these methods or a combination of both. Using the Nutrition Facts label The Nutrition Facts list is included on the labels of almost all packaged foods and beverages in the [place]. It includes: The serving size. Information about nutrients in each serving, including the grams of carbohydrate per serving. To use the Nutrition Facts, decide how many servings you will have. Then, multiply the number of servings by the number of carbohydrates per serving. The resulting number is the total grams of  carbohydrates that you will be having. Learning the standard serving sizes of foods When you eat carbohydrate foods that are not packaged or do not include Nutrition Facts on the label, you need to measure the servings in order to count the grams of carbohydrates. Measure the foods that you will eat with a food scale or measuring cup, if needed. Decide how many standard-size servings you will eat. Multiply the number of servings by 15. For foods that contain carbohydrates, one serving equals 15 g of carbohydrates. For example, if you eat 2 cups or 10 oz (300 g) of strawberries, you will have eaten 2 servings and 30 g of carbohydrates (2 servings x 15 g = 30 g). For foods that have more than one food mixed, such as soups and casseroles, you must count the carbohydrates in each food that is included. The following list contains standard serving sizes of common carbohydrate-rich foods. Each of these servings has about 15 g of carbohydrates: 1 slice of bread. 1 six-inch (15 cm) tortilla. ? cup or 2 oz (53 g) cooked rice or pasta.  cup or 3 oz (85 g) cooked or canned, drained and rinsed beans or lentils.  cup or 3 oz (85 g) starchy vegetable, such as peas, corn, or squash.  cup or 4 oz (120 g) hot cereal.  cup or 3 oz (85  g) boiled or mashed potatoes, or  or 3 oz (85 g) of a large baked potato.  cup or 4 fl oz (118 mL) fruit juice. 1 cup or 8 fl oz (237 mL) milk. 1 small or 4 oz (106 g) apple.  or 2 oz (63 g) of a medium banana. 1 cup or 5 oz (150 g) strawberries. 3 cups or 1 oz (28.3 g) popped popcorn. What is an example of carbohydrate counting? To calculate the grams of carbohydrates in this sample meal, follow the steps shown below. Sample meal 3 oz (85 g) chicken breast. ? cup or 4 oz (106 g) brown rice.  cup or 3 oz (85 g) corn. 1 cup or 8 fl oz (237 mL) milk. 1 cup or 5 oz (150 g) strawberries with sugar-free whipped topping. Carbohydrate calculation Identify the foods that  contain carbohydrates: Rice. Corn. Milk. Strawberries. Calculate how many servings you have of each food: 2 servings rice. 1 serving corn. 1 serving milk. 1 serving strawberries. Multiply each number of servings by 15 g: 2 servings rice x 15 g = 30 g. 1 serving corn x 15 g = 15 g. 1 serving milk x 15 g = 15 g. 1 serving strawberries x 15 g = 15 g. Add together all of the amounts to find the total grams of carbohydrates eaten: 30 g + 15 g + 15 g + 15 g = 75 g of carbohydrates total. What are tips for following this plan? Shopping Develop a meal plan and then make a shopping list. Buy fresh and frozen vegetables, fresh and frozen fruit, dairy, eggs, beans, lentils, and whole grains. Look at food labels. Choose foods that have more fiber and less sugar. Avoid processed foods and foods with added sugars. Meal planning Aim to have the same number of grams of carbohydrates at each meal and for each snack time. Plan to have regular, balanced meals and snacks. Where to find more information American Diabetes Association: diabetes.org Centers for Disease Control and Prevention: [employer].com.[place] Academy of Nutrition and Dietetics: eatright.org Association of Diabetes Care & Education Specialists: diabeteseducator.org Summary Carbohydrate counting is a method of keeping track of how many carbohydrates you eat. Eating carbohydrates increases the amount of sugar (glucose) in your blood. Counting how many carbohydrates you eat improves how well you manage your blood glucose. This helps you manage your diabetes. A dietitian can help you make a meal plan and calculate how many carbohydrates you should have at each meal and snack. This information is not intended to replace advice given to you by your health care provider. Make sure you discuss any questions you have with your health care provider. Document Revised: 01/25/2020 Document Reviewed: 01/25/2020 Elsevier Patient Education  [place].

## 2021-07-09 NOTE — Progress Notes (Signed)
Established Patient Office Visit  Subjective:  Patient ID: Kerry Webb, female    DOB: 1965/10/03  Age: 56 y.o. MRN: [id number]  CC:  Chief Complaint  Patient presents with   Follow-up    Labs drawn 06/19/21   Diabetes    HPI Kerry Webb is a 56 year old female who has history of type 2 diabetes, GERD, heart murmur, hypertension, seizure presents for follow up and lab review. She was discharged from the hospital on 06/21/21 for loss of consciousness. During her hospital course, she was started on gabapentin  100 mg bid for anxiety by the Psychiatrist which she reports taking as needed, her Metoprolol was decreased to 50 mg daily.  Her blood pressure during visit is improving, she checks it at home and reports that it was 177/ 115 this morning which is likely due to stress. She lost her job and has financial difficulties. She states that she's compliant with her medications, denies side effects and continues to make healthy lifestyle changes. She denies any syncopal episodes since hospital discharge. She will follow up with Cardiologist in February. Her Lipid panel done on 06/19/21, LDL was 160 mg/dl, HDL was 34 mg/dl, Triglycerides was 325 mg/dl and Total cholesterol was 256 mg/dl, and she continues on 40 mg of Atorvastatin. Her last HgbA1c done on 04/29/21 was 9.1%, she checks her fasting blood glucose daily and reports that it's less than 120 mg/dl.She denies hypo/hyperglycemic symptoms, peripheral neuropathy and performs daily foot checks. She states that her mood is good, denies suicidal nor homicidal ideation. Overall, she states that she's doing well and offers no further complaint.  Past Medical History:  Diagnosis Date   Diabetes mellitus without complication ([place])    GERD (gastroesophageal reflux disease)    Heart murmur 1967   Hypertension 1993   Lump or mass in breast    Left   Seizures ([place]) 1983   Ulcer 1985    Past Surgical History:  Procedure Laterality  Date   ABDOMINAL HYSTERECTOMY  2004   APPENDECTOMY  2014   BREAST BIOPSY Right 01-23-14   benign   BREAST SURGERY Left 01/14/12   BREAST SURGERY Right 05-16-14   benign   CHOLECYSTECTOMY N/A 05/18/2019   Procedure: LAPAROSCOPIC CHOLECYSTECTOMY;  Surgeon: Benjamine Sprague, DO;  Location: [facility] ORS;  Service: General;  Laterality: N/A;   COLONOSCOPY  2013   Dr. Candace Cruise    Family History  Problem Relation Age of Onset   Liver cancer Mother    Cancer Mother        liver   Thyroid disease Mother    Kidney disease Father    Alcohol abuse Father    Other Brother        auto accident   Diabetes Maternal Aunt    Breast cancer Maternal Aunt    Cancer Maternal Grandmother        breast   Hypertension Paternal Grandmother    Kidney disease Paternal Grandfather    Hypertension Paternal Grandfather     Social History   Socioeconomic History   Marital status: Married    Spouse name: Not on file   Number of children: Not on file   Years of education: Not on file   Highest education level: Not on file  Occupational History   Not on file  Tobacco Use   Smoking status: Never   Smokeless tobacco: Never  Vaping Use   Vaping Use: Never used  Substance and Sexual Activity   Alcohol use:  Not Currently    Alcohol/week: 1.0 standard drink    Types: 1 Glasses of wine per week    Comment: last use 03/2021   Drug use: Never   Sexual activity: Not on file  Other Topics Concern   Not on file  Social History Narrative   Not on file   Social Determinants of Health   Financial Resource Strain: Not on file  Food Insecurity: Food Insecurity Present   Worried About [place] in the Last Year: Sometimes true   Ran Out of Food in the Last Year: Sometimes true  Transportation Needs: No Transportation Needs   Lack of Transportation (Medical): No   Lack of Transportation (Non-Medical): No  Physical Activity: Not on file  Stress: Not on file  Social Connections: Not on file  Intimate  Partner Violence: Not on file    Outpatient Medications Prior to Visit  Medication Sig Dispense Refill   atorvastatin (LIPITOR) 40 MG tablet Take 1 tablet (40 mg total) by mouth once daily. 90 tablet 0   blood glucose meter kit and supplies KIT USE AS DIRECTED TO CHECK BLOOD SUGAR UP TO 4 TIMES DAILY. 1 each 0   dapagliflozin propanediol (FARXIGA) 10 MG TABS tablet Take 1 tablet (10 mg total) by mouth once every morning. 90 tablet 0   gabapentin (NEURONTIN) 100 MG capsule Take 1 capsule (100 mg total) by mouth 2 (two) times daily as needed for anxiety. 60 capsule 1   glucose blood (RIGHTEST GS550 BLOOD GLUCOSE) test strip USE AS DIRECTED TO CHECK BLOOD SUGAR UP TO 4 TIMES DAILY. 100 strip 0   hydrALAZINE (APRESOLINE) 50 MG tablet Take 1 tablet (50 mg total) by mouth 3 (three) times daily. 30 tablet 0   Insulin Glargine (BASAGLAR KWIKPEN) 100 UNIT/ML Inject 30 Units into the skin once daily at bedtime. (Replaces Lantus) 15 mL 5   Insulin Pen Needle 32G X 4 MM MISC Use as directed with insulin pen 100 each 0   losartan (COZAAR) 100 MG tablet Take 1 tablet (100 mg total) by mouth once daily. 90 tablet 0   metFORMIN (GLUCOPHAGE-XR) 500 MG 24 hr tablet Take 1 tablet (500 mg total) by mouth once daily with breakfast. 30 tablet 0   metoprolol tartrate (LOPRESSOR) 50 MG tablet Take 1 tablet (50 mg total) by mouth once daily. 90 tablet 1   Rightest GL300 Lancets MISC USE AS DIRECTED TO CHECK BLOOD SUGAR UP TO 4 TIMES DAILY. 100 each 0   sertraline (ZOLOFT) 100 MG tablet Take 1 tablet (100 mg total) by mouth once daily. 90 tablet 0   levETIRAcetam (KEPPRA) 500 MG tablet Take 1 tablet (500 mg total) by mouth 2 (two) times daily. 60 tablet 0   No facility-administered medications prior to visit.    Allergies  Allergen Reactions   Cashew Nut Oil Shortness Of Breath   Shellfish Allergy Shortness Of Breath   Asa [Aspirin]     Intestinal bleeding    ROS Review of Systems  Constitutional: Negative.    Eyes: Negative.   Respiratory: Negative.    Cardiovascular: Negative.   Endocrine: Negative.   Skin: Negative.   Neurological: Negative.   Psychiatric/Behavioral: Negative.       Objective:    Physical Exam HENT:     Head: Normocephalic and atraumatic.     Mouth/Throat:     Mouth: Mucous membranes are moist.  Eyes:     Extraocular Movements: Extraocular movements intact.  Conjunctiva/sclera: Conjunctivae normal.     Pupils: Pupils are equal, round, and reactive to light.  Cardiovascular:     Rate and Rhythm: Normal rate and regular rhythm.     Pulses: Normal pulses.     Heart sounds: Normal heart sounds.  Pulmonary:     Effort: Pulmonary effort is normal.     Breath sounds: Normal breath sounds.  Skin:    General: Skin is warm.  Neurological:     General: No focal deficit present.     Mental Status: She is alert and oriented to person, place, and time. Mental status is at baseline.  Psychiatric:        Mood and Affect: Mood normal.        Behavior: Behavior normal.        Thought Content: Thought content normal.        Judgment: Judgment normal.    BP (!) 147/98 (BP Location: Right Arm, Patient Position: Sitting, Cuff Size: Large)    Pulse 74    Temp (!) 97.4 F (36.3 C) (Oral)    Resp 16    Ht 5' 5"  (1.651 m)    Wt 186 lb 8 oz (84.6 kg)    SpO2 95%    BMI 31.04 kg/m  Wt Readings from Last 3 Encounters:  07/09/21 186 lb 8 oz (84.6 kg)  06/20/21 190 lb (86.2 kg)  06/19/21 190 lb 4.8 oz (86.3 kg)   Encouraged weight loss  Health Maintenance Due  Topic Date Due   Pneumococcal Vaccine 58-40 Years old (1 - PCV) Never done   FOOT EXAM  Never done   OPHTHALMOLOGY EXAM  Never done   Hepatitis C Screening  Never done   TETANUS/TDAP  Never done   Zoster Vaccines- Shingrix (1 of 2) Never done   PAP SMEAR-Modifier  Never done   COLONOSCOPY (Pts 45-64yr Insurance coverage will need to be confirmed)  Never done   MAMMOGRAM  01/30/2019   COVID-19 Vaccine (2 -  Janssen risk series) 12/06/2019    There are no preventive care reminders to display for this patient.  No results found for: TSH Lab Results  Component Value Date   WBC 7.0 06/20/2021   HGB 14.6 06/20/2021   HCT 44.0 06/20/2021   MCV 88.7 06/20/2021   PLT 288 06/20/2021   Lab Results  Component Value Date   NA 137 06/20/2021   K 3.8 06/20/2021   CO2 24 06/20/2021   GLUCOSE 349 (H) 06/20/2021   BUN 13 06/20/2021   CREATININE 0.66 06/20/2021   BILITOT 0.3 06/13/2021   ALKPHOS 146 (H) 06/13/2021   AST 13 06/13/2021   ALT 21 06/13/2021   PROT 7.2 06/13/2021   ALBUMIN 4.5 06/13/2021   CALCIUM 8.8 (L) 06/20/2021   ANIONGAP 7 06/20/2021   EGFR 90 06/13/2021   Lab Results  Component Value Date   CHOL 256 (H) 06/19/2021   Lab Results  Component Value Date   HDL 34 (L) 06/19/2021   Lab Results  Component Value Date   LDLCALC 160 (H) 06/19/2021   Lab Results  Component Value Date   TRIG 325 (H) 06/19/2021   Lab Results  Component Value Date   CHOLHDL 7.5 (H) 06/19/2021   Lab Results  Component Value Date   HGBA1C 9.1 (H) 04/29/2021      Assessment & Plan:    1. Primary hypertension - Her blood pressure is improving, will continue on current medication, DASH diet and exercise as tolerated.  She will follow up with [facility] on 07/11/21. She was educated on the signs and symptoms of Stroke, advised to go to the ED for worsening symptoms.  2. History of seizure -She has no seizure activities, will continue on current medication, will refer to Neurology when [facility] Financial assistance is approved. She was advised to go to the ED for seizure activities. - levETIRAcetam (KEPPRA) 500 MG tablet; Take 1 tablet (500 mg total) by mouth 2 (two) times daily.  Dispense: 60 tablet; Refill: 0  3. Diabetes mellitus without complication ([place]) - Her HgbA1c was 9.1%, she will continue current medication, low carb/non concentrated sweet diet and exercise as tolerated.  She was advised to check her blood glucose bid, record and bring log to follow up appointment. - POCT HgB A1C; Future  4. Hospital discharge follow-up - She was advised to continue her hospital discharge instructions.   5. Hyperlipidemia, unspecified hyperlipidemia type - The 10-year ASCVD risk score (Arnett DK, et al., 2019) is: 32.9%   Values used to calculate the score:     Age: 70 years     Sex: Female     Is Non-Hispanic African American: Yes     Diabetic: Yes     Tobacco smoker: No     Systolic Blood Pressure: 162 mmHg     Is BP treated: Yes     HDL Cholesterol: 34 mg/dL     Total Cholesterol: 256 mg/dL Her ASCVD risk is 32.9%, she will continue on 40 mg Atorvastatin, low fat/cholesterol diet and exercise as tolerated. Will recheck labs in 3 months.    Follow-up: Return in about 29 days (around 08/07/2021), or if symptoms worsen or fail to improve.    Darrnell Mangiaracina Jerold Coombe, NP

## 2021-07-11 ENCOUNTER — Ambulatory Visit: Payer: Self-pay | Admitting: Licensed Clinical Social Worker

## 2021-07-11 ENCOUNTER — Institutional Professional Consult (permissible substitution): Payer: Self-pay | Admitting: Licensed Clinical Social Worker

## 2021-07-11 DIAGNOSIS — F411 Generalized anxiety disorder: Secondary | ICD-10-CM

## 2021-07-11 DIAGNOSIS — F339 Major depressive disorder, recurrent, unspecified: Secondary | ICD-10-CM
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
## 2021-07-16 ENCOUNTER — Other Ambulatory Visit: Payer: Self-pay

## 2021-07-16 ENCOUNTER — Ambulatory Visit: Payer: Self-pay | Admitting: Licensed Clinical Social Worker

## 2021-07-16 DIAGNOSIS — F411 Generalized anxiety disorder: Secondary | ICD-10-CM

## 2021-07-16 DIAGNOSIS — F339 Major depressive disorder, recurrent, unspecified: Secondary | ICD-10-CM

## 2021-07-16 NOTE — BH Specialist Note (Unsigned)
Integrated Behavioral Health via Telemedicine Visit  07/16/2021 TONDA WIEDERHOLD [id number]   Total time: 23  Referring Provider: Carlyon Shadow, NP  Patient/Family location: [facility] Provider location: *** All persons participating in visit: [place] and [employer], LCSW-A Types of Service: {CHL AMB TYPE OF SERVICE:[phone]}  I connected with [name] and Telephone or Video Enabled Telemedicine Application  (Video is Caregility application) and verified that I am speaking with the correct person using two identifiers. Discussed confidentiality: Yes   I discussed the limitations of telemedicine and the availability of in person appointments.  Discussed there is a possibility of technology failure and discussed alternative modes of communication if that failure occurs.  Patient and/or legal guardian expressed understanding and consented to Telemedicine visit: Yes   Presenting Concerns: Patient and/or family reports the following symptoms/concerns: The patient reports  Duration of problem: years ; Severity of problem: moderate  Patient and/or Family's Strengths/Protective Factors: {CHL AMB BH PROTECTIVE FACTORS:[phone]}  Goals Addressed: Patient will:  Reduce symptoms of: {IBH Symptoms:21014056}   Increase knowledge and/or ability of: coping skills, healthy habits, self-management skills, and stress reduction   Demonstrate ability to: {IBH Goals:21014053}  Progress towards Goals: Ongoing  Interventions: Interventions utilized:  Supportive Counseling Standardized Assessments completed: GAD-7 and PHQ 9  Patient and/or Family Response: ***  Assessment: Patient currently experiencing see above.   Patient may benefit from see above.  Plan: Follow up with behavioral health clinician on : *** Behavioral recommendations:  Referral(s): [place] (In Clinic)  I discussed the assessment and treatment plan with the  patient and/or parent/guardian. They were provided an opportunity to ask questions and all were answered. They agreed with the plan and demonstrated an understanding of the instructions.   They were advised to call back or seek an in-person evaluation if the symptoms worsen or if the condition fails to improve as anticipated.  Lesli Albee, LCSWA

## 2021-07-18 ENCOUNTER — Other Ambulatory Visit: Payer: Self-pay | Admitting: Gerontology

## 2021-07-18 ENCOUNTER — Other Ambulatory Visit: Payer: Self-pay

## 2021-07-18 MED ORDER — RIGHTEST GS550 BLOOD GLUCOSE VI STRP
ORAL_STRIP | 0 refills | Status: AC
  Filled 2021-07-18: qty 100, 25d supply, fill #0

## 2021-07-24 ENCOUNTER — Other Ambulatory Visit: Payer: Self-pay

## 2021-07-24 ENCOUNTER — Ambulatory Visit: Payer: Self-pay | Admitting: Licensed Clinical Social Worker

## 2021-07-24 DIAGNOSIS — F411 Generalized anxiety disorder: Secondary | ICD-10-CM

## 2021-07-24 DIAGNOSIS — F339 Major depressive disorder, recurrent, unspecified: Secondary | ICD-10-CM

## 2021-07-24 NOTE — BH Specialist Note (Signed)
Integrated Behavioral Health Follow Up In-Person Visit  MRN: [id number] Name: Kerry Webb    Types of Service: Individual psychotherapy  Interpretor:No. Interpretor Name and Language: N/A  Subjective: Kerry Webb is a 56 y.o. female accompanied by  herself Patient was referred by Hurman Horn, NP for Mental Health. Patient reports the following symptoms/concerns: The patient reports that she has been doing okay since her last follow up appointment. She shared that she is being rehired with the school system and has her choice of two locations to chooses from. She discussed financial stressors impacting her life currently. She noted that her daughter has sent her some money and that has helped her to see the light at the end of the tunnel. The patient discussed her previous marriage and family dynamics. 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 She denied experiencing any suicidal thoughts since her last follow-up appointment. The patient noted that she has been staying busy and focused on completing her to do list each day and believes that has helped improve her mood. The patient denied any homicidal thoughts.  Duration of problem: Years; Severity of problem: moderate  Objective: Mood: Euthymic and Affect: Appropriate Risk of harm to self or others: No plan to harm self or others  Life Context: Family and Social: see above School/Work: see above Self-Care: see above Life Changes: see above  Patient and/or Family's Strengths/Protective Factors: Concrete supports in place (healthy food, safe environments, etc.) and Sense of purpose  Goals Addressed: Patient will:  Reduce symptoms of: agitation, anxiety, depression, insomnia, and stress   Increase knowledge and/or ability of: coping skills, healthy habits, self-management skills, and stress reduction   Demonstrate ability to: Increase healthy adjustment to current life  circumstances and Increase adequate support systems for patient/family  Progress towards Goals: Ongoing  Interventions: Interventions utilized:  CBT Cognitive Behavioral Therapy was utilized by the clinician during today's follow up session. Clinician met with patient to identify needs related to stressors and functioning, and assess and monitor for signs and symptoms of anxiety and depression, and assess safety. The clinician processed with the patient how they have been doing since the last follow-up session. Clinician intervened with positive regard and optimism to validate patient's emotions, and supported client in exploring ways to access community resources. Clinician discussed self care and encouraged the patient to incorporate it into her daily routine. Clinician encouraged the patient to continue to work on calming techniques (e.g.. paced breathing, deep muscle relaxation, and calming imagery) as a strategy for responding appropriately to anxiety and the urge to avoid situations or self-isolate when they occur and move towards increasing the patients self regulation. The session ended with scheduling.    Standardized Assessments completed: GAD-7 and PHQ 9 GAD-7 = 12 PHQ-9 = 13   Assessment: Patient currently experiencing see above.   Patient may benefit from see above.  Plan: Follow up with behavioral health clinician on : 02/08/223 Behavioral recommendations:  Referral(s): [facility] [facility] (In Clinic) "From scale of 1-10, how likely are you to follow plan?":   Judith Part, LCSWA

## 2021-07-31 ENCOUNTER — Ambulatory Visit: Payer: Self-pay | Admitting: Licensed Clinical Social Worker

## 2021-08-08 ENCOUNTER — Ambulatory Visit: Payer: Self-pay | Admitting: Licensed Clinical Social Worker

## 2021-08-12 ENCOUNTER — Ambulatory Visit: Payer: Self-pay | Admitting: Cardiology

## 2021-08-13 ENCOUNTER — Encounter: Payer: Self-pay | Admitting: Cardiology

## 2021-08-14 ENCOUNTER — Ambulatory Visit: Payer: Self-pay | Admitting: Licensed Clinical Social Worker

## 2021-08-14 ENCOUNTER — Telehealth: Payer: Self-pay | Admitting: Licensed Clinical Social Worker

## 2021-08-14 NOTE — Telephone Encounter (Signed)
Called the patient twice during today's scheduled appointment; no answer, unable to leave a voicemail; the mailbox was full.

## 2021-08-15 ENCOUNTER — Ambulatory Visit: Payer: Self-pay | Admitting: Gerontology

## 2021-08-19 ENCOUNTER — Other Ambulatory Visit: Payer: Self-pay

## 2021-08-20 ENCOUNTER — Other Ambulatory Visit: Payer: Self-pay

## 2021-09-02 ENCOUNTER — Other Ambulatory Visit: Payer: Self-pay

## 2021-09-02 ENCOUNTER — Other Ambulatory Visit: Payer: Self-pay | Admitting: Gerontology

## 2021-09-02 DIAGNOSIS — Z794 Long term (current) use of insulin: Secondary | ICD-10-CM

## 2021-09-02 DIAGNOSIS — E119 Type 2 diabetes mellitus without complications: Secondary | ICD-10-CM

## 2021-09-02 DIAGNOSIS — Z87898 Personal history of other specified conditions: Secondary | ICD-10-CM

## 2021-09-02 DIAGNOSIS — I10 Essential (primary) hypertension: Secondary | ICD-10-CM

## 2021-09-03 ENCOUNTER — Other Ambulatory Visit: Payer: Self-pay

## 2021-09-03 MED FILL — Levetiracetam Tab 500 MG: ORAL | 30 days supply | Qty: 60 | Fill #0 | Status: AC

## 2021-09-03 MED FILL — Hydralazine HCl Tab 50 MG: ORAL | 30 days supply | Qty: 90 | Fill #0 | Status: AC

## 2021-09-03 MED FILL — Metformin HCl Tab ER 24HR 500 MG: ORAL | 30 days supply | Qty: 30 | Fill #0 | Status: AC

## 2021-09-04 ENCOUNTER — Other Ambulatory Visit: Payer: Self-pay

## 2021-09-18 ENCOUNTER — Encounter: Payer: Self-pay | Admitting: Gerontology

## 2021-09-18 ENCOUNTER — Other Ambulatory Visit: Payer: Self-pay

## 2021-09-18 ENCOUNTER — Ambulatory Visit: Payer: Self-pay | Admitting: Gerontology

## 2021-09-18 VITALS — BP 146/93 | HR 75 | Temp 97.5°F | Ht 61.0 in | Wt 188.0 lb

## 2021-09-18 DIAGNOSIS — E1169 Type 2 diabetes mellitus with other specified complication: Secondary | ICD-10-CM

## 2021-09-18 DIAGNOSIS — I10 Essential (primary) hypertension: Secondary | ICD-10-CM

## 2021-09-18 DIAGNOSIS — E119 Type 2 diabetes mellitus without complications: Secondary | ICD-10-CM

## 2021-09-18 LAB — POCT GLYCOSYLATED HEMOGLOBIN (HGB A1C): Hemoglobin A1C: 8.9 % — AB (ref 4.0–5.6)

## 2021-09-18 LAB — GLUCOSE, POCT (MANUAL RESULT ENTRY): POC Glucose: 177 mg/dl — AB (ref 70–99)

## 2021-09-18 MED ORDER — ATORVASTATIN CALCIUM 40 MG PO TABS
40.0000 mg | ORAL_TABLET | Freq: Every day | ORAL | 0 refills | Status: DC
  Filled 2021-09-18: qty 90, 90d supply, fill #0

## 2021-09-18 MED ORDER — LOSARTAN POTASSIUM 100 MG PO TABS
100.0000 mg | ORAL_TABLET | Freq: Every day | ORAL | 0 refills | Status: DC
  Filled 2021-09-18: qty 90, 90d supply, fill #0

## 2021-09-18 MED ORDER — DAPAGLIFLOZIN PROPANEDIOL 10 MG PO TABS
10.0000 mg | ORAL_TABLET | Freq: Every morning | ORAL | 0 refills | Status: DC
  Filled 2021-09-18: qty 30, 30d supply, fill #0
  Filled 2021-10-21 (×2): qty 30, 30d supply, fill #1

## 2021-09-18 MED ORDER — METFORMIN HCL ER 500 MG PO TB24
1000.0000 mg | ORAL_TABLET | Freq: Every day | ORAL | 2 refills | Status: DC
  Filled 2021-09-18: qty 180, 90d supply, fill #0

## 2021-09-18 MED ORDER — HYDRALAZINE HCL 50 MG PO TABS
50.0000 mg | ORAL_TABLET | Freq: Two times a day (BID) | ORAL | 2 refills | Status: DC
  Filled 2021-09-18 – 2021-11-25 (×3): qty 60, 30d supply, fill #0

## 2021-09-18 NOTE — Progress Notes (Signed)
? ?Established Patient Office Visit ? ?Subjective:  ?Patient ID: Kerry Webb, female    DOB: [birth year]  Age: 56 y.o. MRN: [id number] ? ?CC:  ?Chief Complaint  ?Patient presents with  ? Follow-up  ? Diabetes  ?  Hasnt checked sugars in a while since she started working  ? ? ?HPI ?Kerry Webb  is a 56 year old female who has history of type 2 diabetes, GERD, heart murmur, hypertension, seizure presents for follow up. Her HgA1c done on 09/18/21 decreased from 9.1% to 8.9% . She stated that she has not been checking  her blood glucose and blood pressure since she started her new job in January. Her blood glucose was 177 mg/dl. She denies hypo/hyperglycemic symptoms, peripheral neuropathy and performs daily foot checks. Mono filament check today was normal. She said she is compliant with her medications but takes her 50 mg hydralazine 2 times a day instead 3 times .  She denies chest pain, palpitation, light headedness and vision changes. She states that her mood is good, denies suicidal nor homicidal ideation.  she states that she's doing well generally and denied any other complaint ? ?Past Medical History:  ?Diagnosis Date  ? Diabetes mellitus without complication ([place])   ? GERD (gastroesophageal reflux disease)   ? Heart murmur 1967  ? Hypertension 1993  ? Lump or mass in breast   ? Left  ? Seizures ([place]) 1983  ? Ulcer 1985  ? ? ?Past Surgical History:  ?Procedure Laterality Date  ? ABDOMINAL HYSTERECTOMY  2004  ? APPENDECTOMY  2014  ? BREAST BIOPSY Right 01-23-14  ? benign  ? BREAST SURGERY Left 01/14/12  ? BREAST SURGERY Right 05-16-14  ? benign  ? CHOLECYSTECTOMY N/A 05/18/2019  ? Procedure: LAPAROSCOPIC CHOLECYSTECTOMY;  Surgeon: Benjamine Sprague, DO;  Location: [facility] ORS;  Service: General;  Laterality: N/A;  ? COLONOSCOPY  2013  ? Dr. Candace Cruise  ? ? ?Family History  ?Problem Relation Age of Onset  ? Liver cancer Mother   ? Cancer Mother   ?     liver  ? Thyroid disease Mother   ? Kidney disease Father   ?  Alcohol abuse Father   ? Other Brother   ?     auto accident  ? Diabetes Maternal Aunt   ? Breast cancer Maternal Aunt   ? Cancer Maternal Grandmother   ?     breast  ? Hypertension Paternal Grandmother   ? Kidney disease Paternal Grandfather   ? Hypertension Paternal Grandfather   ? ? ?Social History  ? ?Socioeconomic History  ? Marital status: Married  ?  Spouse name: Not on file  ? Number of children: Not on file  ? Years of education: Not on file  ? Highest education level: Not on file  ?Occupational History  ? Not on file  ?Tobacco Use  ? Smoking status: Never  ? Smokeless tobacco: Never  ?Vaping Use  ? Vaping Use: Never used  ?Substance and Sexual Activity  ? Alcohol use: Not Currently  ?  Alcohol/week: 1.0 standard drink  ?  Types: 1 Glasses of wine per week  ?  Comment: last use 03/2021  ? Drug use: Never  ? Sexual activity: Not on file  ?Other Topics Concern  ? Not on file  ?Social History Narrative  ? Not on file  ? ?Social Determinants of Health  ? ?Financial Resource Strain: Not on file  ?Food Insecurity: Food Insecurity Present  ? Worried About [employer] of  Food in the Last Year: Sometimes true  ? Ran Out of Food in the Last Year: Sometimes true  ?Transportation Needs: No Transportation Needs  ? Lack of Transportation (Medical): No  ? Lack of Transportation (Non-Medical): No  ?Physical Activity: Not on file  ?Stress: Not on file  ?Social Connections: Not on file  ?Intimate Partner Violence: Not on file  ? ? ?Outpatient Medications Prior to Visit  ?Medication Sig Dispense Refill  ? gabapentin (NEURONTIN) 100 MG capsule Take 1 capsule (100 mg total) by mouth 2 (two) times daily as needed for anxiety. 60 capsule 1  ? Insulin Glargine (BASAGLAR KWIKPEN) 100 UNIT/ML Inject 30 Units into the skin once daily at bedtime. (Replaces Lantus) 15 mL 5  ? metoprolol tartrate (LOPRESSOR) 50 MG tablet Take 1 tablet (50 mg total) by mouth once daily. 90 tablet 1  ? sertraline (ZOLOFT) 100 MG tablet Take 1 tablet (100  mg total) by mouth once daily. 90 tablet 0  ? atorvastatin (LIPITOR) 40 MG tablet Take 1 tablet (40 mg total) by mouth once daily. 90 tablet 0  ? dapagliflozin propanediol (FARXIGA) 10 MG TABS tablet Take 1 tablet (10 mg total) by mouth once every morning. 90 tablet 0  ? hydrALAZINE (APRESOLINE) 50 MG tablet Take 1 tablet (50 mg total) by mouth 3 (three) times daily. 90 tablet 0  ? losartan (COZAAR) 100 MG tablet Take 1 tablet (100 mg total) by mouth once daily. 90 tablet 0  ? metFORMIN (GLUCOPHAGE-XR) 500 MG 24 hr tablet Take 1 tablet (500 mg total) by mouth once daily with breakfast. 30 tablet 0  ? blood glucose meter kit and supplies KIT USE AS DIRECTED TO CHECK BLOOD SUGAR UP TO 4 TIMES DAILY. 1 each 0  ? glucose blood (RIGHTEST GS550 BLOOD GLUCOSE) test strip USE AS DIRECTED TO CHECK BLOOD SUGAR UP TO 4 TIMES DAILY. 100 strip 0  ? Insulin Pen Needle 32G X 4 MM MISC Use as directed with insulin pen 100 each 0  ? levETIRAcetam (KEPPRA) 500 MG tablet Take 1 tablet (500 mg total) by mouth 2 (two) times daily. (Patient not taking: Reported on 09/18/2021) 60 tablet 0  ? Rightest GL300 Lancets MISC USE AS DIRECTED TO CHECK BLOOD SUGAR UP TO 4 TIMES DAILY. 100 each 0  ? ?No facility-administered medications prior to visit.  ? ? ?Allergies  ?Allergen Reactions  ? Cashew Nut Oil Shortness Of Breath  ? Shellfish Allergy Shortness Of Breath  ? Asa [Aspirin]   ?  Intestinal bleeding  ? ? ?ROS ?Review of Systems  ?Constitutional: Negative.   ?Eyes: Negative.   ?Cardiovascular: Negative.   ?Gastrointestinal: Negative.   ?Endocrine: Negative.   ?Genitourinary: Negative.   ?Musculoskeletal: Negative.   ?Skin: Negative.   ?Neurological: Negative.   ?Psychiatric/Behavioral: Negative.    ? ?  ?Objective:  ?  ?Physical Exam ?HENT:  ?   Mouth/Throat:  ?   Mouth: Mucous membranes are moist.  ?Eyes:  ?   Extraocular Movements: Extraocular movements intact.  ?   Conjunctiva/sclera: Conjunctivae normal.  ?   Pupils: Pupils are equal,  round, and reactive to light.  ?Cardiovascular:  ?   Rate and Rhythm: Normal rate and regular rhythm.  ?   Pulses: Normal pulses.  ?   Heart sounds: Normal heart sounds.  ?Pulmonary:  ?   Effort: Pulmonary effort is normal.  ?   Breath sounds: Normal breath sounds.  ?Skin: ?   General: Skin is warm and dry.  ?  Neurological:  ?   General: No focal deficit present.  ?   Mental Status: She is alert and oriented to person, place, and time. Mental status is at baseline.  ?Psychiatric:     ?   Mood and Affect: Mood normal.     ?   Behavior: Behavior normal.     ?   Thought Content: Thought content normal.     ?   Judgment: Judgment normal.  ? ? ?BP (!) 146/93 (BP Location: Right Arm, Patient Position: Sitting, Cuff Size: Large)   Pulse 75   Temp (!) 97.5 ?F (36.4 ?C)   Ht 5' 1"  (1.549 m)   Wt 188 lb (85.3 kg)   SpO2 96%   BMI 35.52 kg/m?  ?Wt Readings from Last 3 Encounters:  ?09/18/21 188 lb (85.3 kg)  ?07/09/21 186 lb 8 oz (84.6 kg)  ?06/20/21 190 lb (86.2 kg)  ? ?Encouraged weight loss ? ?Health Maintenance Due  ?Topic Date Due  ? FOOT EXAM  Never done  ? OPHTHALMOLOGY EXAM  Never done  ? Hepatitis C Screening  Never done  ? TETANUS/TDAP  Never done  ? Zoster Vaccines- Shingrix (1 of 2) Never done  ? PAP SMEAR-Modifier  Never done  ? COLONOSCOPY (Pts 45-14yr Insurance coverage will need to be confirmed)  Never done  ? MAMMOGRAM  01/30/2019  ? COVID-19 Vaccine (2 - [place] risk series) 12/06/2019  ? ? ?There are no preventive care reminders to display for this patient. ? ?No results found for: TSH ?Lab Results  ?Component Value Date  ? WBC 7.0 06/20/2021  ? HGB 14.6 06/20/2021  ? HCT 44.0 06/20/2021  ? MCV 88.7 06/20/2021  ? PLT 288 06/20/2021  ? ?Lab Results  ?Component Value Date  ? NA 137 06/20/2021  ? K 3.8 06/20/2021  ? CO2 24 06/20/2021  ? GLUCOSE 349 (H) 06/20/2021  ? BUN 13 06/20/2021  ? CREATININE 0.66 06/20/2021  ? BILITOT 0.3 06/13/2021  ? ALKPHOS 146 (H) 06/13/2021  ? AST 13 06/13/2021  ? ALT 21  06/13/2021  ? PROT 7.2 06/13/2021  ? ALBUMIN 4.5 06/13/2021  ? CALCIUM 8.8 (L) 06/20/2021  ? ANIONGAP 7 06/20/2021  ? EGFR 90 06/13/2021  ? ?Lab Results  ?Component Value Date  ? CHOL 256 (H) 06/19/2021  ? ?Lab Results  ?

## 2021-09-18 NOTE — Patient Instructions (Signed)
Carbohydrate Counting for Diabetes Mellitus, Adult Carbohydrate counting is a method of keeping track of how many carbohydrates you eat. Eating carbohydrates increases the amount of sugar (glucose) in the blood. Counting how many carbohydrates you eat improves how well you manage your blood glucose. This, in turn, helps you manage your diabetes. Carbohydrates are measured in grams (g) per serving. It is important to know how many carbohydrates (in grams or by serving size) you can have in each meal. This is different for every person. A dietitian can help you make a meal plan and calculate how many carbohydrates you should have at each meal and snack. What foods contain carbohydrates? Carbohydrates are found in the following foods: Grains, such as breads and cereals. Dried beans and soy products. Starchy vegetables, such as potatoes, peas, and corn. Fruit and fruit juices. Milk and yogurt. Sweets and snack foods, such as cake, cookies, candy, chips, and soft drinks. How do I count carbohydrates in foods? There are two ways to count carbohydrates in food. You can read food labels or learn standard serving sizes of foods. You can use either of these methods or a combination of both. Using the Nutrition Facts label The Nutrition Facts list is included on the labels of almost all packaged foods and beverages in the [place]. It includes: The serving size. Information about nutrients in each serving, including the grams of carbohydrate per serving. To use the Nutrition Facts, decide how many servings you will have. Then, multiply the number of servings by the number of carbohydrates per serving. The resulting number is the total grams of carbohydrates that you will be having. Learning the standard serving sizes of foods When you eat carbohydrate foods that are not packaged or do not include Nutrition Facts on the label, you need to measure the servings in order to count the grams of  carbohydrates. Measure the foods that you will eat with a food scale or measuring cup, if needed. Decide how many standard-size servings you will eat. Multiply the number of servings by 15. For foods that contain carbohydrates, one serving equals 15 g of carbohydrates. For example, if you eat 2 cups or 10 oz (300 g) of strawberries, you will have eaten 2 servings and 30 g of carbohydrates (2 servings x 15 g = 30 g). For foods that have more than one food mixed, such as soups and casseroles, you must count the carbohydrates in each food that is included. The following list contains standard serving sizes of common carbohydrate-rich foods. Each of these servings has about 15 g of carbohydrates: 1 slice of bread. 1 six-inch (15 cm) tortilla. ? cup or 2 oz (53 g) cooked rice or pasta.  cup or 3 oz (85 g) cooked or canned, drained and rinsed beans or lentils.  cup or 3 oz (85 g) starchy vegetable, such as peas, corn, or squash.  cup or 4 oz (120 g) hot cereal.  cup or 3 oz (85 g) boiled or mashed potatoes, or  or 3 oz (85 g) of a large baked potato.  cup or 4 fl oz (118 mL) fruit juice. 1 cup or 8 fl oz (237 mL) milk. 1 small or 4 oz (106 g) apple.  or 2 oz (63 g) of a medium banana. 1 cup or 5 oz (150 g) strawberries. 3 cups or 1 oz (28.3 g) popped popcorn. What is an example of carbohydrate counting? To calculate the grams of carbohydrates in this sample meal, follow the steps   shown below. ?Sample meal ?3 oz (85 g) chicken breast. ?? cup or 4 oz (106 g) brown rice. ?? cup or 3 oz (85 g) corn. ?1 cup or 8 fl oz (237 mL) milk. ?1 cup or 5 oz (150 g) strawberries with sugar-free whipped topping. ?Carbohydrate calculation ?Identify the foods that contain carbohydrates: ?Rice. ?Corn. ?Milk. ?Strawberries. ?Calculate how many servings you have of each food: ?2 servings rice. ?1 serving corn. ?1 serving milk. ?1 serving strawberries. ?Multiply each number of servings by 15 g: ?2 servings rice x 15  g = 30 g. ?1 serving corn x 15 g = 15 g. ?1 serving milk x 15 g = 15 g. ?1 serving strawberries x 15 g = 15 g. ?Add together all of the amounts to find the total grams of carbohydrates eaten: ?30 g + 15 g + 15 g + 15 g = 75 g of carbohydrates total. ?What are tips for following this plan? ?Shopping ?Develop a meal plan and then make a shopping list. ?Buy fresh and frozen vegetables, fresh and frozen fruit, dairy, eggs, beans, lentils, and whole grains. ?Look at food labels. Choose foods that have more fiber and less sugar. ?Avoid processed foods and foods with added sugars. ?Meal planning ?Aim to have the same number of grams of carbohydrates at each meal and for each snack time. ?Plan to have regular, balanced meals and snacks. ?Where to find more information ?American Diabetes Association: diabetes.org ?Centers for Disease Control and Prevention: StoreMirror.com.cy ?Academy of Nutrition and Dietetics: eatright.org ?Association of Diabetes Care & Education Specialists: diabeteseducator.org ?Summary ?Carbohydrate counting is a method of keeping track of how many carbohydrates you eat. ?Eating carbohydrates increases the amount of sugar (glucose) in your blood. ?Counting how many carbohydrates you eat improves how well you manage your blood glucose. This helps you manage your diabetes. ?A dietitian can help you make a meal plan and calculate how many carbohydrates you should have at each meal and snack. ?This information is not intended to replace advice given to you by your health care provider. Make sure you discuss any questions you have with your health care provider. ?Document Revised: 01/25/2020 Document Reviewed: 01/25/2020 ?Elsevier Patient Education ? [place] Eating Plan ?DASH stands for Dietary Approaches to Stop Hypertension. The DASH eating plan is a healthy eating plan that has been shown to: ?Reduce high blood pressure (hypertension). ?Reduce your risk for type 2 diabetes, heart disease, and  stroke. ?Help with weight loss. ?What are tips for following this plan? ?Reading food labels ?Check food labels for the amount of salt (sodium) per serving. Choose foods with less than 5 percent of the Daily Value of sodium. Generally, foods with less than 300 milligrams (mg) of sodium per serving fit into this eating plan. ?To find whole grains, look for the word "whole" as the first word in the ingredient list. ?Shopping ?Buy products labeled as "low-sodium" or "no salt added." ?Buy fresh foods. Avoid canned foods and pre-made or frozen meals. ?Cooking ?Avoid adding salt when cooking. Use salt-free seasonings or herbs instead of table salt or sea salt. Check with your health care provider or pharmacist before using salt substitutes. ?Do not fry foods. Cook foods using healthy methods such as baking, boiling, grilling, roasting, and broiling instead. ?Cook with heart-healthy oils, such as olive, canola, avocado, soybean, or sunflower oil. ?Meal planning ? ?Eat a balanced diet that includes: ?4 or more servings of fruits and 4 or more servings of vegetables each day.  Try to fill one-half of your plate with fruits and vegetables. ?6-8 servings of whole grains each day. ?Less than 6 oz (170 g) of lean meat, poultry, or fish each day. A 3-oz (85-g) serving of meat is about the same size as a deck of cards. One egg equals 1 oz (28 g). ?2-3 servings of low-fat dairy each day. One serving is 1 cup (237 mL). ?1 serving of nuts, seeds, or beans 5 times each week. ?2-3 servings of heart-healthy fats. Healthy fats called omega-3 fatty acids are found in foods such as walnuts, flaxseeds, fortified milks, and eggs. These fats are also found in cold-water fish, such as sardines, salmon, and mackerel. ?Limit how much you eat of: ?Canned or prepackaged foods. ?Food that is high in trans fat, such as some fried foods. ?Food that is high in saturated fat, such as fatty meat. ?Desserts and other sweets, sugary drinks, and other foods  with added sugar. ?Full-fat dairy products. ?Do not salt foods before eating. ?Do not eat more than 4 egg yolks a week. ?Try to eat at least 2 vegetarian meals a week. ?Eat more home-cooked food and less restaurant, bu

## 2021-09-20 ENCOUNTER — Telehealth: Payer: Self-pay | Admitting: Pharmacist

## 2021-09-20 ENCOUNTER — Other Ambulatory Visit: Payer: Self-pay | Admitting: Gerontology

## 2021-09-20 ENCOUNTER — Other Ambulatory Visit: Payer: Self-pay

## 2021-09-20 DIAGNOSIS — R4589 Other symptoms and signs involving emotional state: Secondary | ICD-10-CM

## 2021-09-20 NOTE — Telephone Encounter (Signed)
09/20/2021 10:13:06 AM - [facility] forms to dr & pt ?-- Arletha Pili - Friday, September 20, 2021 10:10 AM --  ?Received printout from pharmacy for [facility] '10mg'$ . Mailed pt forms to sign. Also included 5374 recertification packet requesting POI & taxes. Provider portion to sign in [facility] folder on my desk. ?

## 2021-09-23 ENCOUNTER — Other Ambulatory Visit: Payer: Self-pay

## 2021-09-26 ENCOUNTER — Telehealth: Payer: Self-pay | Admitting: Pharmacist

## 2021-09-26 NOTE — Telephone Encounter (Signed)
09/26/2021 10:36:29 AM - [facility] pending ?-- Arletha Pili - Thursday, September 26, 2021 10:34 AM -- ?Received providers signed portion for Farxiga '10mg'$ . Holding for pt signed portion, POI & taxes which was mailded on 09-20-21. ?

## 2021-09-27 ENCOUNTER — Other Ambulatory Visit: Payer: Self-pay

## 2021-10-01 ENCOUNTER — Telehealth: Payer: Self-pay | Admitting: Licensed Clinical Social Worker

## 2021-10-01 NOTE — Telephone Encounter (Signed)
Called patient; scheduled an IBH follow-up.  ?

## 2021-10-03 ENCOUNTER — Ambulatory Visit: Payer: Self-pay | Admitting: Licensed Clinical Social Worker

## 2021-10-03 DIAGNOSIS — F411 Generalized anxiety disorder: Secondary | ICD-10-CM

## 2021-10-03 NOTE — BH Specialist Note (Signed)
Integrated Behavioral Health Follow Up In-Person Visit ? ?MRN: [id number] ?Name: Kerry Webb ? ?Number of [place] Clinician visits: No data recorded ?Session Start time: No data recorded  ?Session End time: No data recorded ?Total time in minutes: No data recorded ? ?Types of Service: Individual psychotherapy ? ?Interpretor:No. Interpretor Name and Language: N/A ? ?Subjective: ?Kerry Webb is a 56 y.o. female accompanied by  herself ?Patient was referred by Carlyon Shadow, NP for mental health. ?Patient reports the following symptoms/concerns: The patient reports that she has been doing worse since her last follow-up appointment. She noted that she has been experiencing increased work and financial related stressors that are impacting her life. She explained that two weeks ago she confided in a coworker that she was not feeling well and was soon after contacted by the school nurse and told to go home. She shared that has impacted her relationship with her coworker and she is currently struggling with feeling anxious about going to work the night before and morning of her shift. 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 The patient described difficulty falling asleep that worsened two weeks ago after the work stressors increased. She shared she has been up for the last two nights. She explained that she has been texting her friend who lives [place] of the [place] most of the night. She shared that she doesn't feel tired at night, and feels like her mind is very active. However, the patient reports that her energy levels are low during the day. Kerry Webb stated that she typically takes between 2-6 benadryl per night to help her sleep, but that has not worked lately. She shared that she has previously tried melatonin (MG unknown) and did not find it helpful. She noted that she needs a refill of  Sertraline 100 MG and requested the clinician to message her primary care provider to see if she can have a refill. The patient explained that she has been struggling to attend her IBH follow-up appointments due to her work schedule. Kerry Webb denied any suicidal or homicidal thoughts.  ?Duration of problem: Years; Severity of problem: moderate ? ?Objective: ?Mood: Euthymic and Affect: Appropriate ?Risk of harm to self or others: No plan to harm self or others ? ?Life Context: ?Family and Social: see above ?School/Work: see above ?Self-Care: see above ?Life Changes: see above ? ?Patient and/or Family's Strengths/Protective Factors: ?Social connections, Concrete supports in place (healthy food, safe environments, etc.), and Sense of purpose ? ?Goals Addressed: ?Patient will: ? Reduce symptoms of: agitation, anxiety, depression, insomnia, and stress  ? Increase knowledge and/or ability of: coping skills, healthy habits, self-management skills, and stress reduction  ? Demonstrate ability to: Increase healthy adjustment to current life circumstances and Increase motivation to adhere to plan of care ? ?Progress towards Goals: ?Ongoing ? ?Interventions: ?Interventions utilized:  CBT Cognitive Behavioral Therapy was utilized by the clinician during today's follow up session. Clinician met with patient to identify needs related to stressors and functioning, and assess and monitor for signs and symptoms of anxiety and depression, and assess safety. The clinician processed with the patient how they have been doing since the last follow-up session. Clinician measured the patient's anxiety and depression on a numerical scale. Clinician assessed the patients pattern of sleep, bedtime routines, activities associated with the bed, activity and energy level while awake, night time snacking, stimulant use, daytime napping, total sleep  amounts. Clinician explored the patients thoughts and associated emotions regarding sleep. Clinician  reviewed sleep hygiene with the patient and encouraged her to practice going to bed and waking at the same times daily even on the weekends, to use her bed for sleeping and sexual activity only, and to limit her use of electronic devices and stimulants such as sugar and caffeine. Clinician intervened with positive regard and optimism to validate patient's emotions, and supported the patient in exploring ways for the patient to regularly attend her scheduled IBH appointments. Clinician messaged the patient's provider with her medication request. The session ended with scheduling.,  ?Standardized Assessments completed: GAD-7 and PHQ 9 ?GAD-7= 6 ?PHQ-9= 9 ? ?Assessment: ?Patient currently experiencing see above.  ? ?Patient may benefit from see above. ? ?Plan: ?Follow up with behavioral health clinician on : 10/16/2021 at 5:00 PM  ?Behavioral recommendations:  ?Referral(s): [place] (In Clinic) ?"From scale of 1-10, how likely are you to follow plan?":  ? ?Lesli Albee, LCSWA ? ? ?

## 2021-10-04 ENCOUNTER — Other Ambulatory Visit: Payer: Self-pay

## 2021-10-04 ENCOUNTER — Other Ambulatory Visit: Payer: Self-pay | Admitting: Gerontology

## 2021-10-04 DIAGNOSIS — Z87898 Personal history of other specified conditions: Secondary | ICD-10-CM

## 2021-10-04 MED FILL — Levetiracetam Tab 500 MG: ORAL | 30 days supply | Qty: 60 | Fill #0 | Status: CN

## 2021-10-07 ENCOUNTER — Other Ambulatory Visit: Payer: Self-pay

## 2021-10-08 ENCOUNTER — Other Ambulatory Visit: Payer: Self-pay

## 2021-10-08 ENCOUNTER — Other Ambulatory Visit: Payer: Self-pay | Admitting: Gerontology

## 2021-10-08 DIAGNOSIS — F411 Generalized anxiety disorder: Secondary | ICD-10-CM

## 2021-10-08 MED ORDER — SERTRALINE HCL 100 MG PO TABS
100.0000 mg | ORAL_TABLET | Freq: Every day | ORAL | 0 refills | Status: DC
  Filled 2021-10-08 – 2021-10-21 (×2): qty 30, 30d supply, fill #0

## 2021-10-09 ENCOUNTER — Other Ambulatory Visit: Payer: Self-pay

## 2021-10-15 ENCOUNTER — Other Ambulatory Visit: Payer: Self-pay

## 2021-10-16 ENCOUNTER — Ambulatory Visit: Payer: Self-pay | Admitting: Licensed Clinical Social Worker

## 2021-10-16 DIAGNOSIS — F411 Generalized anxiety disorder: Secondary | ICD-10-CM

## 2021-10-16 DIAGNOSIS — F339 Major depressive disorder, recurrent, unspecified: Secondary | ICD-10-CM

## 2021-10-16 NOTE — BH Specialist Note (Signed)
Integrated Behavioral Health via Telemedicine Visit ? ?10/16/2021 ?Kerry Webb ?[id number] ? ? ?Referring Provider: Carlyon Shadow, NP ?Patient/Family location: The patient's home  ?[facility] Provider location: [facility] [place] ?All persons participating in visit: Tramya L. Fogarty and [employer], LCSW-A ?Types of Service: Telephone visit ? ?I connected with Lizza L Sikkema via  Telephone or Video Enabled Telemedicine Application  (Video is Caregility application) and verified that I am speaking with the correct person using two identifiers. Discussed confidentiality: Yes  ? ?I discussed the limitations of telemedicine and the availability of in person appointments.  Discussed there is a possibility of technology failure and discussed alternative modes of communication if that failure occurs. ? ?Patient and/or legal guardian expressed understanding and consented to Telemedicine visit: Yes  ? ?Presenting Concerns: ?Patient and/or family reports the following symptoms/concerns: The patient reports that she has been doing better since her last follow-up appointment. She noted that she continues to struggle with feeling down several days a week and has difficulty falling asleep every night. She shared that she is still able to function on her job even though she is not sleeping well. The patient discussed situational stressors impacting her life currently. She shared more details regarding her relationship with her ex husband and children. She noted that she is looking for a new apartment that will better fit her budget and because she feels her landlords are watching her every move. The patient noted that she is looking forward to spending some time focused on her self care soon. Ms. Oka denied any suicidal or homicidal thoughts.   ?Duration of problem: Years ; Severity of problem: moderate ? ?Patient and/or Family's Strengths/Protective Factors: ?Concrete supports in place (healthy  food, safe environments, etc.) ? ?Goals Addressed: ?Patient will: ? Reduce symptoms of: agitation, anxiety, depression, insomnia, and stress  ? Increase knowledge and/or ability of: coping skills, healthy habits, self-management skills, and stress reduction  ? Demonstrate ability to: Increase healthy adjustment to current life circumstances ? ?Progress towards Goals: ?Ongoing ? ?Interventions: ?Interventions utilized:  CBT Cognitive Behavioral Therapywas utilized by the clinician during today's follow up session. Clinician met with patient to identify needs related to stressors and functioning, and assess and monitor for signs and symptoms of anxiety and depression, and assess safety. Clinician processed with the patient how they have been doing since the last follow-up session. Clinician provided a safe judgment fee space for the patient to ventilate their frustrations regarding their current life circumstances.Clinician measured the patient anxiety and depression on a numerical scale. Clinician encourage the patient to take things one day at a time and focus on the positives in her life verses the negatives. Clinician encouraged the patient to attend her follow-up appointments on time going forward. The session ended with scheduling.  ?Standardized Assessments completed: GAD-7 and PHQ 9 ?GAD-7=  7 ?PHQ-9=  7 ? ?Patient and/or Family Response: The patient agreed to attend her follow-up appointments on time.  ? ?Assessment: ?Patient currently experiencing see above.  ? ?Patient may benefit from see above. ? ?Plan: ?Follow up with behavioral health clinician on : 10/24/2021 at 6:00 PM ?Behavioral recommendations:  ?Referral(s): [place] (In Clinic) ? ?I discussed the assessment and treatment plan with the patient and/or parent/guardian. They were provided an opportunity to ask questions and all were answered. They agreed with the plan and demonstrated an understanding of the  instructions. ?  ?They were advised to call back or seek an in-person evaluation if  the symptoms worsen or if the condition fails to improve as anticipated. ? ?Lesli Albee, LCSWA ?

## 2021-10-17 ENCOUNTER — Other Ambulatory Visit: Payer: Self-pay | Admitting: Gerontology

## 2021-10-17 DIAGNOSIS — F411 Generalized anxiety disorder: Secondary | ICD-10-CM

## 2021-10-17 MED ORDER — GABAPENTIN 300 MG PO CAPS
300.0000 mg | ORAL_CAPSULE | Freq: Every day | ORAL | 0 refills | Status: DC
  Filled 2021-10-17: qty 30, 30d supply, fill #0

## 2021-10-18 ENCOUNTER — Other Ambulatory Visit: Payer: Self-pay

## 2021-10-21 ENCOUNTER — Other Ambulatory Visit: Payer: Self-pay

## 2021-10-21 MED FILL — Levetiracetam Tab 500 MG: ORAL | 20 days supply | Qty: 39 | Fill #0 | Status: AC

## 2021-10-22 ENCOUNTER — Other Ambulatory Visit: Payer: Self-pay

## 2021-10-24 ENCOUNTER — Ambulatory Visit: Payer: Self-pay | Admitting: Licensed Clinical Social Worker

## 2021-10-24 DIAGNOSIS — F411 Generalized anxiety disorder: Secondary | ICD-10-CM

## 2021-10-24 DIAGNOSIS — F331 Major depressive disorder, recurrent, moderate: Secondary | ICD-10-CM

## 2021-10-24 NOTE — BH Specialist Note (Signed)
Integrated Behavioral Health via Telemedicine Visit ? ?10/24/2021 ?Geralyn L Spaeth ?[id number] ? ? ?Referring Provider: Carlyon Shadow, NP ?Patient/Family location: The patient's home address ?[facility] Provider location: [facility] [place] ?All persons participating in visit: Diasha L. Addis and [employer], LCSW-A ?Types of Service: Telephone visit ? ?I connected with Sarit L Escue via Telephone or Video Enabled Telemedicine Application  (Video is Caregility application) and verified that I am speaking with the correct person using two identifiers. Discussed confidentiality: Yes  ? ?I discussed the limitations of telemedicine and the availability of in person appointments. Discussed there is a possibility of technology failure and discussed alternative modes of communication if that failure occurs. ? ?Patient and/or legal guardian expressed understanding and consented to Telemedicine visit: Yes  ? ?Presenting Concerns: ?Patient and/or family reports the following symptoms/concerns: The patient reports that she has been doing okay since her last follow-up appointment. She shared she is sleeping about 6 hours a night. She noted that she had been sick with a sore throat and did not go to work today. She stated that when she messaged the Clinician on Saturday, she was having a moment of feeling very overwhelmed by bill collectors calling her wanting money. She shared that she did have thoughts on Saturday that ending her life was the only way out, but that she did not have a plan of how she would end her life or access to means to carry out a plan. She shared that she had a friend come over who supported her, and she soon felt better. The patient stated she had not had any suicidal thoughts since Saturday. She denied any homicidal thoughts. She shared that she would establish care with [facility] next week and use the mental health crisis resources provided to her should she need them. The patient  denied any homicidal thoughts.  ?Duration of problem: Years; Severity of problem: moderate ? ?Patient and/or Family's Strengths/Protective Factors: ?Concrete supports in place (healthy food, safe environments, etc.) and Sense of purpose ? ?Goals Addressed: ?Patient will: ? Reduce symptoms of: agitation, anxiety, depression, insomnia, and stress  ? Increase knowledge and/or ability of: coping skills, healthy habits, self-management skills, and stress reduction  ? Demonstrate ability to: Increase healthy adjustment to current life circumstances ? ?Progress towards Goals: ?Other ? ?Interventions: ?Interventions utilized: Supportive Counseling was utilized by the clinician during today's follow up session. The clinician met with the patient to identify needs related to stressors and functioning, assess and monitor for signs and symptoms of anxiety and depression, and assess safety. The clinician processed with the patient how they had been doing since the last follow-up session. The clinician measured the patient's anxiety and depression on a numerical scale. Clinician intervened with positive regard and optimism to validate the client's emotions, supported the client by encouraging her to use the mental health crisis resources provided when needed, and reminded the patient that the clinician is not available 24/7 and is a support for non-emergency questions or concerns only. The clinician explained that she spoke to the psychiatric consultant, Dr. Octavia Heir, MD, and the patient's primary care provider Carlyon Shadow, NP, on Tuesday, 10/22/2021, and that referring the patient to [place] for a higher level of care was recommended. The clinician again provided the patient with [facility]'s contact information and explained that she did not need an appointment to be seen at [facility]; she could walk in on Monday, Wednesday, or Friday, 8 AM-3 PM, to establish care. Finally, the clinician answered the  patient's questions and provided a space  to share her thoughts and feelings regarding transitioning care. ?Standardized Assessments completed: GAD-7 and PHQ 9 ?GAD-7= 06 ?PHQ-9= 18 ? ?Patient and/or Family Response: The patient agreed to be referred to [facility] and stated she would establish care with them next week.  ? ?Assessment: ?Patient currently experiencing see above.  ? ?Patient may benefit from see above. ? ?Plan: ?Follow up with behavioral health clinician on :  ?Behavioral recommendations:  ?Referral(s): [place] (LME/Outside Clinic) Referred to [facility] ? ?I discussed the assessment and treatment plan with the patient and/or parent/guardian. They were provided an opportunity to ask questions and all were answered. They agreed with the plan and demonstrated an understanding of the instructions. ?  ?They were advised to call back or seek an in-person evaluation if the symptoms worsen or if the condition fails to improve as anticipated. ? ?Lesli Albee, LCSWA ?

## 2021-10-29 ENCOUNTER — Ambulatory Visit: Payer: Self-pay | Admitting: Licensed Clinical Social Worker

## 2021-11-08 ENCOUNTER — Other Ambulatory Visit: Payer: Self-pay

## 2021-11-25 ENCOUNTER — Other Ambulatory Visit: Payer: Self-pay | Admitting: Gerontology

## 2021-11-25 ENCOUNTER — Other Ambulatory Visit: Payer: Self-pay

## 2021-11-25 DIAGNOSIS — F411 Generalized anxiety disorder: Secondary | ICD-10-CM

## 2021-11-26 ENCOUNTER — Other Ambulatory Visit: Payer: Self-pay | Admitting: Gerontology

## 2021-11-26 ENCOUNTER — Telehealth: Payer: Self-pay | Admitting: Licensed Clinical Social Worker

## 2021-11-26 ENCOUNTER — Other Ambulatory Visit: Payer: Self-pay

## 2021-11-26 DIAGNOSIS — F411 Generalized anxiety disorder: Secondary | ICD-10-CM

## 2021-11-26 MED ORDER — SERTRALINE HCL 100 MG PO TABS
100.0000 mg | ORAL_TABLET | Freq: Every day | ORAL | 0 refills | Status: DC
  Filled 2021-11-26 (×2): qty 30, 30d supply, fill #0

## 2021-11-26 MED ORDER — GABAPENTIN 300 MG PO CAPS
300.0000 mg | ORAL_CAPSULE | Freq: Every day | ORAL | 0 refills | Status: DC
  Filled 2021-11-26 (×2): qty 30, 30d supply, fill #0

## 2021-11-26 NOTE — Telephone Encounter (Addendum)
I called the patient and strongly encouraged her to follow up with the referral she received to [facility] on 10/24/2021 because the [facility] is not able meet her mental healthcare needs in this limited setting. I informed her that her provider Carlyon Shadow, NP called in a refill for Zoloft 100 MG for a 30 day supply and this would be the last time she would receive this medication refill through the [facility] and reiterated that she needed to follow-up with [facility] for her psychotropic medication and mental health care needs. Clinician informed the patient again of the process for establishing care with [facility] and gave her information regarding mental health crisis resources should she need them. She requested that the clinician message her provider and request a refill of gabapentin 300 MG to help her sleep. I told her that I would message her provider but it was no guarantee they could call in gabapentin at this time. Provider informed the patient that she would mail her a letter restating this information for her benefit.

## 2021-11-26 NOTE — Telephone Encounter (Signed)
  Update: The patient reported she was able to pick up her prescription for Zoloft and Gabapentin from the [facility] today and has a an appointment with a new provider at the end of the month.

## 2021-11-29 ENCOUNTER — Other Ambulatory Visit: Payer: Self-pay

## 2021-11-29 ENCOUNTER — Telehealth: Payer: Self-pay | Admitting: Pharmacy Technician

## 2021-11-29 NOTE — Telephone Encounter (Signed)
Patient has prescription drug coverage with [employer].  Jacquelynn Cree Patient Advocate Specialist [place] at [facility]

## 2021-11-30 ENCOUNTER — Other Ambulatory Visit: Payer: Self-pay

## 2021-12-13 ENCOUNTER — Other Ambulatory Visit: Payer: Self-pay | Admitting: Family Medicine

## 2021-12-13 DIAGNOSIS — Z1231 Encounter for screening mammogram for malignant neoplasm of breast: Secondary | ICD-10-CM

## 2021-12-18 ENCOUNTER — Ambulatory Visit: Payer: Self-pay | Admitting: Gerontology

## 2021-12-24 ENCOUNTER — Encounter: Payer: Self-pay | Admitting: Psychiatry

## 2021-12-24 ENCOUNTER — Other Ambulatory Visit: Payer: Self-pay

## 2021-12-24 ENCOUNTER — Emergency Department (EMERGENCY_DEPARTMENT_HOSPITAL)
Admission: EM | Admit: 2021-12-24 | Discharge: 2021-12-24 | Disposition: A | Discharge status: Other Acute Inpatient Hospital | Payer: BC Managed Care – PPO | Source: Home / Self Care | Attending: Emergency Medicine | Admitting: Emergency Medicine

## 2021-12-24 ENCOUNTER — Inpatient Hospital Stay
Admission: AD | Admit: 2021-12-24 | Discharge: 2021-12-27 | DRG: 885 | Disposition: A | Discharge status: Home / Self Care | Payer: BC Managed Care – PPO | Source: Intra-hospital | Attending: Psychiatry | Admitting: Psychiatry

## 2021-12-24 DIAGNOSIS — Z91013 Allergy to seafood: Secondary | ICD-10-CM

## 2021-12-24 DIAGNOSIS — Z886 Allergy status to analgesic agent status: Secondary | ICD-10-CM | POA: Diagnosis not present

## 2021-12-24 DIAGNOSIS — K219 Gastro-esophageal reflux disease without esophagitis: Secondary | ICD-10-CM | POA: Diagnosis present

## 2021-12-24 DIAGNOSIS — Z91018 Allergy to other foods: Secondary | ICD-10-CM

## 2021-12-24 DIAGNOSIS — R45851 Suicidal ideations: Secondary | ICD-10-CM | POA: Diagnosis present

## 2021-12-24 DIAGNOSIS — F39 Unspecified mood [affective] disorder: Secondary | ICD-10-CM | POA: Insufficient documentation

## 2021-12-24 DIAGNOSIS — Z9151 Personal history of suicidal behavior: Secondary | ICD-10-CM

## 2021-12-24 DIAGNOSIS — Z833 Family history of diabetes mellitus: Secondary | ICD-10-CM | POA: Diagnosis not present

## 2021-12-24 DIAGNOSIS — F332 Major depressive disorder, recurrent severe without psychotic features: Principal | ICD-10-CM | POA: Diagnosis present

## 2021-12-24 DIAGNOSIS — Z7984 Long term (current) use of oral hypoglycemic drugs: Secondary | ICD-10-CM

## 2021-12-24 DIAGNOSIS — F322 Major depressive disorder, single episode, severe without psychotic features: Secondary | ICD-10-CM | POA: Insufficient documentation

## 2021-12-24 DIAGNOSIS — Z20822 Contact with and (suspected) exposure to covid-19: Secondary | ICD-10-CM | POA: Diagnosis present

## 2021-12-24 DIAGNOSIS — Z87898 Personal history of other specified conditions: Secondary | ICD-10-CM

## 2021-12-24 DIAGNOSIS — F411 Generalized anxiety disorder: Secondary | ICD-10-CM

## 2021-12-24 DIAGNOSIS — Z79899 Other long term (current) drug therapy: Secondary | ICD-10-CM | POA: Diagnosis not present

## 2021-12-24 DIAGNOSIS — Z794 Long term (current) use of insulin: Secondary | ICD-10-CM

## 2021-12-24 DIAGNOSIS — I10 Essential (primary) hypertension: Secondary | ICD-10-CM

## 2021-12-24 DIAGNOSIS — E119 Type 2 diabetes mellitus without complications: Secondary | ICD-10-CM | POA: Diagnosis present

## 2021-12-24 DIAGNOSIS — F329 Major depressive disorder, single episode, unspecified: Secondary | ICD-10-CM | POA: Diagnosis present

## 2021-12-24 DIAGNOSIS — E1169 Type 2 diabetes mellitus with other specified complication: Secondary | ICD-10-CM

## 2021-12-24 DIAGNOSIS — F4323 Adjustment disorder with mixed anxiety and depressed mood: Secondary | ICD-10-CM | POA: Insufficient documentation

## 2021-12-24 DIAGNOSIS — R4589 Other symptoms and signs involving emotional state: Secondary | ICD-10-CM | POA: Diagnosis present

## 2021-12-24 DIAGNOSIS — Z8249 Family history of ischemic heart disease and other diseases of the circulatory system: Secondary | ICD-10-CM

## 2021-12-24 DIAGNOSIS — R7309 Other abnormal glucose: Secondary | ICD-10-CM | POA: Insufficient documentation

## 2021-12-24 LAB — BASIC METABOLIC PANEL
Anion gap: 7 (ref 5–15)
BUN: 14 mg/dL (ref 6–20)
CO2: 28 mmol/L (ref 22–32)
Calcium: 9.3 mg/dL (ref 8.9–10.3)
Chloride: 105 mmol/L (ref 98–111)
Creatinine, Ser: 0.63 mg/dL (ref 0.44–1.00)
GFR, Estimated: >60 mL/min (ref >60)
Glucose, Bld: 205 mg/dL — ABNORMAL HIGH (ref 70–99)
Potassium: 3.4 mmol/L — ABNORMAL LOW (ref 3.5–5.1)
Sodium: 140 mmol/L (ref 135–145)

## 2021-12-24 LAB — URINE DRUG SCREEN, QUALITATIVE (ARMC ONLY)
Amphetamines, Ur Screen: NOT DETECTED
Barbiturates, Ur Screen: NOT DETECTED
Benzodiazepine, Ur Scrn: NOT DETECTED
Cannabinoid 50 Ng, Ur ~~LOC~~: NOT DETECTED
Cocaine Metabolite,Ur ~~LOC~~: NOT DETECTED
MDMA (Ecstasy)Ur Screen: NOT DETECTED
Methadone Scn, Ur: NOT DETECTED
Opiate, Ur Screen: NOT DETECTED
Phencyclidine (PCP) Ur S: NOT DETECTED
Tricyclic, Ur Screen: NOT DETECTED

## 2021-12-24 LAB — CBC
HCT: 46.4 % — ABNORMAL HIGH (ref 36.0–46.0)
Hemoglobin: 15 g/dL (ref 12.0–15.0)
MCH: 28.2 pg (ref 26.0–34.0)
MCHC: 32.3 g/dL (ref 30.0–36.0)
MCV: 87.4 fL (ref 80.0–100.0)
Platelets: 332 10*3/uL (ref 150–400)
RBC: 5.31 MIL/uL — ABNORMAL HIGH (ref 3.87–5.11)
RDW: 13.3 % (ref 11.5–15.5)
WBC: 10.3 10*3/uL (ref 4.0–10.5)
nRBC: 0 % (ref 0.0–0.2)

## 2021-12-24 LAB — RESP PANEL BY RT-PCR (FLU A&B, COVID) ARPGX2
Influenza A by PCR: NEGATIVE
Influenza B by PCR: NEGATIVE
SARS Coronavirus 2 by RT PCR: NEGATIVE

## 2021-12-24 LAB — GLUCOSE, CAPILLARY: Glucose-Capillary: 211 mg/dL — ABNORMAL HIGH (ref 70–99)

## 2021-12-24 LAB — ACETAMINOPHEN LEVEL: Acetaminophen (Tylenol), Serum: <10 ug/mL — ABNORMAL LOW (ref 10–30)

## 2021-12-24 LAB — CBG MONITORING, ED: Glucose-Capillary: 151 mg/dL — ABNORMAL HIGH (ref 70–99)

## 2021-12-24 MED ORDER — INSULIN ASPART 100 UNIT/ML IJ SOLN
0.0000 [IU] | Freq: Every day | INTRAMUSCULAR | Status: DC
  Administered 2021-12-24 – 2021-12-26 (×2): 2 [IU] via SUBCUTANEOUS
  Filled 2021-12-24 (×2): qty 1

## 2021-12-24 MED ORDER — METFORMIN HCL ER 500 MG PO TB24
1000.0000 mg | ORAL_TABLET | Freq: Every day | ORAL | Status: DC
  Administered 2021-12-25 – 2021-12-27 (×3): 1000 mg via ORAL
  Filled 2021-12-24 (×3): qty 2

## 2021-12-24 MED ORDER — INSULIN GLARGINE-YFGN 100 UNIT/ML ~~LOC~~ SOLN
30.0000 [IU] | Freq: Every day | SUBCUTANEOUS | Status: DC
Start: 2021-12-24 — End: 2021-12-24
  Filled 2021-12-24: qty 0.3

## 2021-12-24 MED ORDER — METOPROLOL TARTRATE 25 MG PO TABS
50.0000 mg | ORAL_TABLET | Freq: Every day | ORAL | Status: DC
  Administered 2021-12-25 – 2021-12-27 (×3): 50 mg via ORAL
  Filled 2021-12-24 (×3): qty 2

## 2021-12-24 MED ORDER — HYDROXYZINE HCL 25 MG PO TABS
25.0000 mg | ORAL_TABLET | Freq: Three times a day (TID) | ORAL | Status: DC | PRN
Start: 2021-12-24 — End: 2021-12-27
  Administered 2021-12-25 – 2021-12-26 (×3): 25 mg via ORAL
  Filled 2021-12-24 (×3): qty 1

## 2021-12-24 MED ORDER — LOSARTAN POTASSIUM 50 MG PO TABS
100.0000 mg | ORAL_TABLET | Freq: Every day | ORAL | Status: DC
  Administered 2021-12-25 – 2021-12-27 (×3): 100 mg via ORAL
  Filled 2021-12-24 (×4): qty 2

## 2021-12-24 MED ORDER — HYDRALAZINE HCL 50 MG PO TABS
50.0000 mg | ORAL_TABLET | Freq: Two times a day (BID) | ORAL | Status: DC
  Administered 2021-12-24 – 2021-12-27 (×6): 50 mg via ORAL
  Filled 2021-12-24 (×8): qty 1

## 2021-12-24 MED ORDER — HYDROXYZINE HCL 25 MG PO TABS
25.0000 mg | ORAL_TABLET | Freq: Three times a day (TID) | ORAL | Status: DC | PRN
  Administered 2021-12-24: 25 mg via ORAL
  Filled 2021-12-24: qty 1

## 2021-12-24 MED ORDER — INSULIN ASPART 100 UNIT/ML IJ SOLN
0.0000 [IU] | Freq: Three times a day (TID) | INTRAMUSCULAR | Status: DC
  Administered 2021-12-24: 3 [IU] via SUBCUTANEOUS
  Filled 2021-12-24: qty 1

## 2021-12-24 MED ORDER — LOSARTAN POTASSIUM 50 MG PO TABS: 100.0000 mg | ORAL_TABLET | Freq: Every day | ORAL | Status: DC

## 2021-12-24 MED ORDER — DAPAGLIFLOZIN PROPANEDIOL 10 MG PO TABS
10.0000 mg | ORAL_TABLET | Freq: Every morning | ORAL | Status: DC
  Administered 2021-12-25 – 2021-12-27 (×3): 10 mg via ORAL
  Filled 2021-12-24 (×3): qty 1

## 2021-12-24 MED ORDER — GABAPENTIN 300 MG PO CAPS: 300.0000 mg | ORAL_CAPSULE | Freq: Every day | ORAL | Status: DC

## 2021-12-24 MED ORDER — SERTRALINE HCL 100 MG PO TABS
100.0000 mg | ORAL_TABLET | Freq: Every day | ORAL | Status: DC
Start: 2021-12-24 — End: 2021-12-24
  Administered 2021-12-24: 100 mg via ORAL
  Filled 2021-12-24: qty 1

## 2021-12-24 MED ORDER — METOPROLOL TARTRATE 25 MG PO TABS
50.0000 mg | ORAL_TABLET | Freq: Every day | ORAL | Status: DC
  Administered 2021-12-24: 50 mg via ORAL
  Filled 2021-12-24: qty 2

## 2021-12-24 MED ORDER — GABAPENTIN 300 MG PO CAPS
300.0000 mg | ORAL_CAPSULE | Freq: Every day | ORAL | Status: DC
  Administered 2021-12-24 – 2021-12-26 (×3): 300 mg via ORAL
  Filled 2021-12-24 (×3): qty 1

## 2021-12-24 MED ORDER — SERTRALINE HCL 100 MG PO TABS
100.0000 mg | ORAL_TABLET | Freq: Every day | ORAL | Status: DC
  Administered 2021-12-25 – 2021-12-27 (×3): 100 mg via ORAL
  Filled 2021-12-24 (×3): qty 1

## 2021-12-24 MED ORDER — DAPAGLIFLOZIN PROPANEDIOL 10 MG PO TABS: 10.0000 mg | ORAL_TABLET | Freq: Every morning | ORAL | Status: DC

## 2021-12-24 MED ORDER — MAGNESIUM HYDROXIDE 400 MG/5ML PO SUSP: 30.0000 mL | Freq: Every day | ORAL | Status: DC | PRN

## 2021-12-24 MED ORDER — LEVETIRACETAM 500 MG PO TABS: 500.0000 mg | ORAL_TABLET | Freq: Two times a day (BID) | ORAL | Status: DC

## 2021-12-24 MED ORDER — ALUM & MAG HYDROXIDE-SIMETH 200-200-20 MG/5ML PO SUSP: 30.0000 mL | ORAL | Status: DC | PRN

## 2021-12-24 MED ORDER — ATORVASTATIN CALCIUM 20 MG PO TABS
40.0000 mg | ORAL_TABLET | Freq: Every day | ORAL | Status: DC
  Administered 2021-12-25 – 2021-12-27 (×3): 40 mg via ORAL
  Filled 2021-12-24 (×3): qty 2

## 2021-12-24 MED ORDER — HYDRALAZINE HCL 50 MG PO TABS
50.0000 mg | ORAL_TABLET | Freq: Two times a day (BID) | ORAL | Status: DC
Start: 2021-12-24 — End: 2021-12-24
  Filled 2021-12-24: qty 1

## 2021-12-24 MED ORDER — ACETAMINOPHEN 325 MG PO TABS: 650.0000 mg | ORAL_TABLET | Freq: Four times a day (QID) | ORAL | Status: DC | PRN

## 2021-12-24 MED ORDER — ATORVASTATIN CALCIUM 20 MG PO TABS: 40.0000 mg | ORAL_TABLET | Freq: Every day | ORAL | Status: DC

## 2021-12-24 MED ORDER — INSULIN ASPART 100 UNIT/ML IJ SOLN: 0.0000 [IU] | Freq: Three times a day (TID) | INTRAMUSCULAR | Status: DC

## 2021-12-24 MED ORDER — LEVETIRACETAM 500 MG PO TABS
500.0000 mg | ORAL_TABLET | Freq: Two times a day (BID) | ORAL | Status: DC
  Administered 2021-12-24 – 2021-12-27 (×6): 500 mg via ORAL
  Filled 2021-12-24 (×7): qty 1

## 2021-12-24 MED ORDER — INSULIN ASPART 100 UNIT/ML IJ SOLN: 0.0000 [IU] | Freq: Every day | INTRAMUSCULAR | Status: DC

## 2021-12-24 MED ORDER — INSULIN GLARGINE-YFGN 100 UNIT/ML ~~LOC~~ SOLN
30.0000 [IU] | Freq: Every day | SUBCUTANEOUS | Status: DC
Start: 2021-12-24 — End: 2021-12-27
  Administered 2021-12-24 – 2021-12-26 (×3): 30 [IU] via SUBCUTANEOUS
  Filled 2021-12-24 (×4): qty 0.3

## 2021-12-24 MED ORDER — METFORMIN HCL ER 500 MG PO TB24: 1000.0000 mg | ORAL_TABLET | Freq: Every day | ORAL | Status: DC

## 2021-12-24 NOTE — ED Notes (Addendum)
Pt belongings:  1 grey dress 1 pair of white sneakers 1 pair of gold hoop earrings 1 gold watch 2 silver bracelets  1 silver necklace 1 cell phone in purple case 1 black bra 1 pair of gray underwear Wallet and keys

## 2021-12-24 NOTE — BH Assessment (Addendum)
Comprehensive Clinical Assessment (CCA) Note  12/24/2021 Kerry Webb [id number]  Kerry Webb, 56 year old female who presents to [facility] ED voluntarily for treatment. 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 Pt has a plan to overdose on her prescription insulin and other medications. Pt was recently scammed out of $15,000 and has been struggling with other financial issues. Pt called her friends and family this morning to give her last goodbyes. Pt stated "If I go home today I will probably be gone before the day is out". Pt tearful in triage.   During TTS assessment pt presents alert and oriented x 4, restless but cooperative, and mood-congruent with affect. The pt does not appear to be responding to internal or external stimuli. Neither is the pt presenting with any delusional thinking. Pt verified the information provided to triage RN.   Pt identifies her main complaint to be that she is overwhelmed and has a plan to kill herself. Patient reports going through an unforeseen divorce a few years ago and just recently she was scammed out her life savings. Patient was tearful. 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 "I am just so tired." Patient lives alone. Patient states she had a plan to overdose on her insulin. Patient reports she was seeing a therapist while going through her divorce but stopped going since she was feeling better. Patient has 2 adult children that are not local. Patient reports having poor sleep habits where she is taking medication to get a good night's rest. Patient denies using any illicit substances and alcohol. Pt reports a medical hx of diabetes. Pt denies current HI/AH/VH. Pt is unable to contract for safety.   Per Barbaraann Share, NP pt is recommended for inpatient psychiatric admission.    Chief Complaint:  Chief Complaint  Patient  presents with   Suicidal   Visit Diagnosis: Adjustment disorder with mixed anxiety and depressed mood    CCA Screening, Triage and Referral (STR)  Patient Reported Information How did you hear about [place]? Self  Referral name: No data recorded Referral phone number: No data recorded  Whom do you see for routine medical problems? No data recorded Practice/Facility Name: No data recorded Practice/Facility Phone Number: No data recorded Name of Contact: No data recorded Contact Number: No data recorded Contact Fax Number: No data recorded Prescriber Name: No data recorded Prescriber Address (if known): No data recorded  What Is the Reason for Your Visit/Call Today? Patient was brought to the ED for SI.  How Long Has This Been Causing You Problems? 1-6 months  What Do You Feel Would Help You the Most Today? Medication(s); Stress Management; Treatment for Depression or other mood problem; Financial Resources   Have You Recently Been in Any Inpatient Treatment (Hospital/Detox/Crisis Center/28-Day Program)? No data recorded Name/Location of Program/Hospital:No data recorded How Long Were You There? No data recorded When Were You Discharged? No data recorded  Have You Ever Received Services From [facility] Before? No data recorded Who Do You See at [facility]? No data recorded  Have You Recently Had Any Thoughts About Hurting Yourself? Yes  Are You Planning to Commit Suicide/Harm Yourself At This time? No   Have you Recently Had Thoughts About [place]? No  Explanation: No data recorded  Have You Used Any Alcohol or Drugs in the Past 24  Hours? No  How Long Ago Did You Use Drugs or Alcohol? No data recorded What Did You Use and How Much? No data recorded  Do You Currently Have a Therapist/Psychiatrist? No  Name of Therapist/Psychiatrist: No data recorded  Have You Been Recently Discharged From Any Office Practice or Programs? No  Explanation of Discharge  From Practice/Program: No data recorded    CCA Screening Triage Referral Assessment Type of Contact: Face-to-Face  Is this Initial or Reassessment? No data recorded Date Telepsych consult ordered in [facility]:  No data recorded Time Telepsych consult ordered in [facility]:  No data recorded  Patient Reported Information Reviewed? No data recorded Patient Left Without Being Seen? No data recorded Reason for Not Completing Assessment: No data recorded  Collateral Involvement: None provided   Does Patient Have a [place]? No data recorded Name and Contact of Legal Guardian: No data recorded If Minor and Not Living with Parent(s), Who has Custody? n/a  Is CPS involved or ever been involved? Never  Is APS involved or ever been involved? Never   Patient Determined To Be At Risk for Harm To Self or Others Based on Review of Patient Reported Information or Presenting Complaint? Yes, for Self-Harm  Method: No data recorded Availability of Means: No data recorded Intent: No data recorded Notification Required: No data recorded Additional Information for Danger to Others Potential: No data recorded Additional Comments for Danger to Others Potential: No data recorded Are There Guns or Other Weapons in Your Home? No data recorded Types of Guns/Weapons: No data recorded Are These Weapons Safely Secured?                            No data recorded Who Could Verify You Are Able To Have These Secured: No data recorded Do You Have any Outstanding Charges, Pending Court Dates, Parole/Probation? No data recorded Contacted To Inform of Risk of Harm To Self or Others: No data recorded  Location of Assessment: [facility] ED   Does Patient Present under Involuntary Commitment? No  IVC Papers Initial File Date: No data recorded  [place] of Residence: [place]   Patient Currently Receiving the Following Services: Not Receiving Services   Determination of Need: Emergent (2  hours)   Options For Referral: ED Visit; Inpatient Hospitalization; Medication Management     CCA Biopsychosocial Intake/Chief Complaint:  No data recorded Current Symptoms/Problems: No data recorded  Patient Reported Schizophrenia/Schizoaffective Diagnosis in Past: No   Strengths: Patient is able to verbalize and communicate her wants and needs.  Preferences: No data recorded Abilities: No data recorded  Type of Services Patient Feels are Needed: No data recorded  Initial Clinical Notes/Concerns: No data recorded  Mental Health Symptoms Depression:   Change in energy/activity; Sleep (too much or little); Difficulty Concentrating; Tearfulness; Hopelessness; Worthlessness; Fatigue   Duration of Depressive symptoms:  Greater than two weeks   Mania:   None   Anxiety:    Difficulty concentrating; Sleep; Tension; Worrying; Fatigue   Psychosis:   None   Duration of Psychotic symptoms: No data recorded  Trauma:   None   Obsessions:   None   Compulsions:   None   Inattention:   None   Hyperactivity/Impulsivity:   None   Oppositional/Defiant Behaviors:   None   Emotional Irregularity:   Chronic feelings of emptiness; Potentially harmful impulsivity   Other Mood/Personality Symptoms:  No data recorded   Mental Status Exam Appearance and  self-care  Stature:   Average   Weight:   Average weight   Clothing:   Neat/clean   Grooming:   Normal   Cosmetic use:   None   Posture/gait:   Tense   Motor activity:   Not Remarkable   Sensorium  Attention:   Normal   Concentration:   Anxiety interferes   Orientation:   X5   Recall/memory:   Normal   Affect and Mood  Affect:   Anxious; Depressed   Mood:   Anxious; Depressed; Hopeless   Relating  Eye contact:   Normal   Facial expression:   Depressed; Sad; Tense; Anxious   Attitude toward examiner:   Cooperative   Thought and Language  Speech flow:  Clear and Coherent    Thought content:   Appropriate to Mood and Circumstances   Preoccupation:   Suicide   Hallucinations:   None   Organization:  No data recorded  [employer] of Knowledge:   Average   Intelligence:   Average   Abstraction:   Normal   Judgement:   Fair   Reality Testing:   Adequate   Insight:   Good   Decision Making:   Normal   Social Functioning  Social Maturity:   Isolates   Social Judgement:   Victimized   Stress  Stressors:   Museum/gallery curator; Grief/losses; Family conflict   Coping Ability:   Programme researcher, broadcasting/film/video Deficits:   None   Supports:   Family     Religion:    Leisure/Recreation:    Exercise/Diet: Exercise/Diet Do You Have Any Trouble Sleeping?: Yes Explanation of Sleeping Difficulties: Patient reports she is not able to stay sleep.   CCA Employment/Education Employment/Work Situation: Employment / Work Situation Employment Situation: Employed  Education:     CCA Family/Childhood History Family and Relationship History: Family history Marital status: Divorced Divorced, when?: 2016 Does patient have children?: Yes How many children?: 2 How is patient's relationship with their children?: Good  Childhood History:     Child/Adolescent Assessment:     CCA Substance Use Alcohol/Drug Use: Alcohol / Drug Use Pain Medications: See MAR Prescriptions: See MAR Over the Counter: See MAR History of alcohol / drug use?: No history of alcohol / drug abuse                         ASAM's:  Six Dimensions of Multidimensional Assessment  Dimension 1:  Acute Intoxication and/or Withdrawal Potential:      Dimension 2:  Biomedical Conditions and Complications:      Dimension 3:  Emotional, Behavioral, or Cognitive Conditions and Complications:     Dimension 4:  Readiness to Change:     Dimension 5:  Relapse, Continued use, or Continued Problem Potential:     Dimension 6:  Recovery/Living Environment:      ASAM Severity Score:    ASAM Recommended Level of Treatment:     Substance use Disorder (SUD)    Recommendations for Services/Supports/Treatments:    DSM5 Diagnoses: Patient Active Problem List   Diagnosis Date Noted   Adjustment disorder with mixed anxiety and depressed mood 06/21/2021   Diabetes mellitus without complication ([place])    History of seizure 06/13/2021   Encounter to establish care 06/13/2021   Dysphoric mood 06/13/2021   Spell of abnormal behavior    Syncope 05/07/2021   Syncope and collapse 04/30/2021   Primary hypertension    Acute cholecystitis 05/17/2019  Family history of breast cancer 01/06/2013   History of nipple discharge 01/06/2013   Hypertension 1993   Seizures ([place]) 1983   HLD (hyperlipidemia) [place]    Patient Centered Plan: Patient is on the following Treatment Plan(s):  Anxiety and Depression   Referrals to Alternative Service(s): Referred to Alternative Service(s):   Place:   Date:   Time:    Referred to Alternative Service(s):   Place:   Date:   Time:    Referred to Alternative Service(s):   Place:   Date:   Time:    Referred to Alternative Service(s):   Place:   Date:   Time:      '@BHCOLLABOFCARE'$ @  [place], Counselor, LCAS-A

## 2021-12-24 NOTE — Tx Team (Signed)
Initial Treatment Plan 12/24/2021 6:11 PM [name] KJZ:[id number]    PATIENT STRESSORS: Financial difficulties   Health problems   Other: Traumatic event     PATIENT STRENGTHS: Ability for insight  Active sense of humor  Average or above average intelligence  Capable of independent living  Communication skills  General fund of knowledge  Motivation for treatment/growth  Physical Health  Supportive family/friends  Work skills    PATIENT IDENTIFIED PROBLEMS: Depression 12/24/21  Suicidal Ideations 12/24/21                   DISCHARGE CRITERIA:  Improved stabilization in mood, thinking, and/or behavior Motivation to continue treatment in a less acute level of care Need for constant or close observation no longer present Reduction of life-threatening or endangering symptoms to within safe limits Safe-care adequate arrangements made Verbal commitment to aftercare and medication compliance  PRELIMINARY DISCHARGE PLAN: Outpatient therapy Return to previous living arrangement Return to previous work or school arrangements  PATIENT/FAMILY INVOLVEMENT: This treatment plan has been presented to and reviewed with the patient, Kerry Webb.  The patient has been given the opportunity to ask questions and make suggestions.  Reyes Ivan, RN 12/24/2021, 6:11 PM
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
## 2021-12-24 NOTE — ED Notes (Signed)
Pt. Transferred to [facility] , room# 4 from triage .Patient was screened by security before entering the unit. Report to include Situation, Background, Assessment and Recommendations from Renato Gails, RN . Pt. Oriented to unit including Q15 minute rounds as well as the security cameras for their protection. Patient is alert and oriented, warm and dry in no acute distress.

## 2021-12-24 NOTE — Progress Notes (Signed)
Received patient from [facility] Emergency Department. Patient skin assessment completed with [place], RN, skin is intact, no contraband found with all unit prohibited items locked and stored away for discharge. Pt. Was admitted under the services of, Dr, Weber Cooks.  Patient oriented to unit/room/call light. Pt. Given extensive admissions education. Patient was encourage to participate in unit activities and continue with plan of care being put into place. Q x 15 minute observation checks were initiated for safety.   Patient is receptive to treatment  being put into place and safety to be maintained on unit per MD orders. Pt. Endorsed she has been having suicidal thoughts and thoughts of death and dying, but denies current active thoughts of harming herself on the unit and or harming others.
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
## 2021-12-24 NOTE — BH Assessment (Addendum)
Patient is to be admitted to [facility] BMU today 12/24/21 by Dr. Weber Cooks.  Attending Physician will be Dr.  Weber Cooks .   Patient has been assigned to room 306, by [facility] Charge Nurse, Coralyn Mark.     ER staff is aware of the admission: Lattie Haw, ER Secretary   Dr. Hulan Saas, ER MD  Joellen Jersey, Patient's Nurse  Seth Bake, Patient Access.

## 2021-12-25 DIAGNOSIS — F332 Major depressive disorder, recurrent severe without psychotic features: Secondary | ICD-10-CM

## 2021-12-25 LAB — GLUCOSE, CAPILLARY
Glucose-Capillary: 180 mg/dL — ABNORMAL HIGH (ref 70–99)
Glucose-Capillary: 165 mg/dL — ABNORMAL HIGH (ref 70–99)
Glucose-Capillary: 152 mg/dL — ABNORMAL HIGH (ref 70–99)
Glucose-Capillary: 114 mg/dL — ABNORMAL HIGH (ref 70–99)

## 2021-12-25 NOTE — Plan of Care (Signed)
Patient pleasant and cooperative on approach. Patient states that she is worried and anxious about her financial issues and she feels so lonely at home. Patient states that she is not going to get any paycheck for July and August. Patient states that she is going to ask her landlord for permission to get a dog that will help her to relax. Denies SI,HI and AVH. Appetite and energy level good. Support and encouragement given.

## 2021-12-25 NOTE — Plan of Care (Signed)
  Problem: Education: Goal: Knowledge of General Education information will improve Description: Including pain rating scale, medication(s)/side effects and non-pharmacologic comfort measures Outcome: Not Progressing   Problem: Health Behavior/Discharge Planning: Goal: Ability to manage health-related needs will improve Outcome: Not Progressing   Problem: Clinical Measurements: Goal: Ability to maintain clinical measurements within normal limits will improve Outcome: Not Progressing Goal: Will remain free from infection Outcome: Not Progressing Goal: Diagnostic test results will improve Outcome: Not Progressing Goal: Respiratory complications will improve Outcome: Not Progressing Goal: Cardiovascular complication will be avoided Outcome: Not Progressing   Problem: Skin Integrity: Goal: Risk for impaired skin integrity will decrease Outcome: Not Progressing   Problem: Safety: Goal: Ability to remain free from injury will improve Outcome: Not Progressing   Problem: Pain Managment: Goal: General experience of comfort will improve Outcome: Not Progressing   Problem: Elimination: Goal: Will not experience complications related to bowel motility Outcome: Not Progressing Goal: Will not experience complications related to urinary retention Outcome: Not Progressing
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
## 2021-12-25 NOTE — Group Note (Signed)
[facility] LCSW Group Therapy Note   Group Date: 12/25/2021 Start Time: 1300 End Time: 1400   Type of Therapy/Topic:  Group Therapy:  Emotion Regulation  Participation Level:  Did Not Attend    Description of Group:    The purpose of this group is to assist patients in learning to regulate negative emotions and experience positive emotions. Patients will be guided to discuss ways in which they have been vulnerable to their negative emotions. These vulnerabilities will be juxtaposed with experiences of positive emotions or situations, and patients challenged to use positive emotions to combat negative ones. Special emphasis will be placed on coping with negative emotions in conflict situations, and patients will process healthy conflict resolution skills.  Therapeutic Goals: Patient will identify two positive emotions or experiences to reflect on in order to balance out negative emotions:  Patient will label two or more emotions that they find the most difficult to experience:  Patient will be able to demonstrate positive conflict resolution skills through discussion or role plays:   Summary of Patient Progress: X   Therapeutic Modalities:   Cognitive Behavioral Therapy Feelings Identification Dialectical Behavioral Therapy   Shirl Harris, LCSW
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
## 2021-12-25 NOTE — Progress Notes (Signed)
Patient up for meds and snack.  Reports feeling better today. Has complaint that the doctor is trying to starve her with his low carb diet restrictions.  She was pleased with her blood sugar however, which was 114 so no short acting needed tonight. She was med compliant and denies si  hi avh at this encounter.    C Butler-Nicholson, LPN

## 2021-12-25 NOTE — BH IP Treatment Plan (Signed)
Interdisciplinary Treatment and Diagnostic Plan Update  12/25/2021 Time of Session: 9:30 AM Kerry Webb MRN: [id number]  Principal Diagnosis: MDD (major depressive disorder)  Secondary Diagnoses: Principal Problem:   MDD (major depressive disorder)   Current Medications:  Current Facility-Administered Medications  Medication Dose Route Frequency Provider Last Rate Last Admin   acetaminophen (TYLENOL) tablet 650 mg  650 mg Oral Q6H PRN Sherlon Handing, NP       alum & mag hydroxide-simeth (MAALOX/MYLANTA) 200-200-20 MG/5ML suspension 30 mL  30 mL Oral Q4H PRN Waldon Merl F, NP       atorvastatin (LIPITOR) tablet 40 mg  40 mg Oral Daily Waldon Merl F, NP   40 mg at 12/25/21 0932   dapagliflozin propanediol (FARXIGA) tablet 10 mg  10 mg Oral q morning Waldon Merl F, NP       gabapentin (NEURONTIN) capsule 300 mg  300 mg Oral QHS Waldon Merl F, NP   300 mg at 12/24/21 2114   hydrALAZINE (APRESOLINE) tablet 50 mg  50 mg Oral BID Waldon Merl F, NP   50 mg at 12/25/21 6712   hydrOXYzine (ATARAX) tablet 25 mg  25 mg Oral TID PRN Sherlon Handing, NP   25 mg at 12/25/21 0809   insulin aspart (novoLOG) injection 0-5 Units  0-5 Units Subcutaneous QHS Waldon Merl F, NP   2 Units at 12/24/21 2116   insulin glargine-yfgn (SEMGLEE) injection 30 Units  30 Units Subcutaneous QHS Waldon Merl F, NP   30 Units at 12/24/21 2115   levETIRAcetam (KEPPRA) tablet 500 mg  500 mg Oral BID Waldon Merl F, NP   500 mg at 12/25/21 4580   losartan (COZAAR) tablet 100 mg  100 mg Oral Daily Waldon Merl F, NP   100 mg at 12/25/21 9983   magnesium hydroxide (MILK OF MAGNESIA) suspension 30 mL  30 mL Oral Daily PRN Waldon Merl F, NP       metFORMIN (GLUCOPHAGE-XR) 24 hr tablet 1,000 mg  1,000 mg Oral Q breakfast Waldon Merl F, NP   1,000 mg at 12/25/21 0806   metoprolol tartrate (LOPRESSOR) tablet 50 mg  50 mg Oral Daily Waldon Merl F, NP   50 mg at  12/25/21 3825   sertraline (ZOLOFT) tablet 100 mg  100 mg Oral Daily Waldon Merl F, NP   100 mg at 12/25/21 0807   PTA Medications: Medications Prior to Admission  Medication Sig Dispense Refill Last Dose   atorvastatin (LIPITOR) 40 MG tablet Take 1 tablet (40 mg total) by mouth once daily. 90 tablet 0    blood glucose meter kit and supplies KIT USE AS DIRECTED TO CHECK BLOOD SUGAR UP TO 4 TIMES DAILY. 1 each 0    dapagliflozin propanediol (FARXIGA) 10 MG TABS tablet Take 1 tablet (10 mg total) by mouth once every morning. 90 tablet 0    gabapentin (NEURONTIN) 300 MG capsule Take 1 capsule (300 mg total) by mouth once nightly at bedtime. 30 capsule 0    glucose blood (RIGHTEST GS550 BLOOD GLUCOSE) test strip USE AS DIRECTED TO CHECK BLOOD SUGAR UP TO 4 TIMES DAILY. 100 strip 0    hydrALAZINE (APRESOLINE) 50 MG tablet Take 1 tablet (50 mg total) by mouth 2 (two) times daily. 60 tablet 2    Insulin Glargine (BASAGLAR KWIKPEN) 100 UNIT/ML Inject 30 Units into the skin once daily at bedtime. (Replaces Lantus) 15 mL 5    Insulin Pen Needle 32G X 4 MM MISC Use  as directed with insulin pen 100 each 0    levETIRAcetam (KEPPRA) 500 MG tablet Take 1 tablet (500 mg total) by mouth 2 (two) times daily. 60 tablet 2    losartan (COZAAR) 100 MG tablet Take 1 tablet (100 mg total) by mouth once daily. 90 tablet 0    metFORMIN (GLUCOPHAGE-XR) 500 MG 24 hr tablet Take 2 tablets (1,000 mg total) by mouth once daily with breakfast. 60 tablet 2    metoprolol tartrate (LOPRESSOR) 50 MG tablet Take 1 tablet (50 mg total) by mouth once daily. 90 tablet 1    Rightest GL300 Lancets MISC USE AS DIRECTED TO CHECK BLOOD SUGAR UP TO 4 TIMES DAILY. 100 each 0    sertraline (ZOLOFT) 100 MG tablet Take 1 tablet (100 mg total) by mouth once daily. 30 tablet 0     Patient Stressors: Financial difficulties   Health problems   Other: Traumatic event    Patient Strengths: Ability for insight  Active sense of humor   Average or above average intelligence  Capable of independent living  Communication skills  General fund of knowledge  Motivation for treatment/growth  Physical Health  Supportive family/friends  Work skills   Treatment Modalities: Medication Management, Group therapy, Case management,  1 to 1 session with clinician, Psychoeducation, Recreational therapy.   Physician Treatment Plan for Primary Diagnosis: MDD (major depressive disorder) Long Term Goal(s):     Short Term Goals:    Medication Management: Evaluate patient's response, side effects, and tolerance of medication regimen.  Therapeutic Interventions: 1 to 1 sessions, Unit Group sessions and Medication administration.  Evaluation of Outcomes: Not Met  Physician Treatment Plan for Secondary Diagnosis: Principal Problem:   MDD (major depressive disorder)  Long Term Goal(s):     Short Term Goals:       Medication Management: Evaluate patient's response, side effects, and tolerance of medication regimen.  Therapeutic Interventions: 1 to 1 sessions, Unit Group sessions and Medication administration.  Evaluation of Outcomes: Not Met   RN Treatment Plan for Primary Diagnosis: MDD (major depressive disorder) Long Term Goal(s): Knowledge of disease and therapeutic regimen to maintain health will improve  Short Term Goals: Ability to remain free from injury will improve, Ability to verbalize frustration and anger appropriately will improve, Ability to demonstrate self-control, Ability to participate in decision making will improve, Ability to verbalize feelings will improve, Ability to disclose and discuss suicidal ideas, Ability to identify and develop effective coping behaviors will improve, and Compliance with prescribed medications will improve  Medication Management: RN will administer medications as ordered by provider, will assess and evaluate patient's response and provide education to patient for prescribed  medication. RN will report any adverse and/or side effects to prescribing provider.  Therapeutic Interventions: 1 on 1 counseling sessions, Psychoeducation, Medication administration, Evaluate responses to treatment, Monitor vital signs and CBGs as ordered, Perform/monitor CIWA, COWS, AIMS and Fall Risk screenings as ordered, Perform wound care treatments as ordered.  Evaluation of Outcomes: Not Met   LCSW Treatment Plan for Primary Diagnosis: MDD (major depressive disorder) Long Term Goal(s): Safe transition to appropriate next level of care at discharge, Engage patient in therapeutic group addressing interpersonal concerns.  Short Term Goals: Engage patient in aftercare planning with referrals and resources, Increase social support, Increase ability to appropriately verbalize feelings, Increase emotional regulation, Facilitate acceptance of mental health diagnosis and concerns, and Increase skills for wellness and recovery  Therapeutic Interventions: Assess for all discharge needs, 1 to 1 time with  Social worker, Explore available resources and support systems, Assess for adequacy in community support network, Educate family and significant other(s) on suicide prevention, Complete Psychosocial Assessment, Interpersonal group therapy.  Evaluation of Outcomes: Not Met   Progress in Treatment: Attending groups: No. Participating in groups: No. Taking medication as prescribed: Yes. Toleration medication: Yes. Family/Significant other contact made: No, will contact:  when given permission. Patient understands diagnosis: Yes. Discussing patient identified problems/goals with staff: Yes. Medical problems stabilized or resolved: Yes. Denies suicidal/homicidal ideation: Yes. Issues/concerns per patient self-inventory: No. Other: none.  New problem(s) identified: No, Describe:  none identified.  New Short Term/Long Term Goal(s): medication management for mood stabilization; elimination of SI  thoughts; development of comprehensive mental wellness/sobriety plan.  Patient Goals: "Just get over my anxiety and the feeling that I just can't go on."    Discharge Plan or Barriers: CSW to assist with development of an appropriate aftercare/discharge plan.    Reason for Continuation of Hospitalization: Depression Medication stabilization Suicidal ideation  Estimated Length of Stay: 1-7 days  Last 3 [place] Suicide Severity Risk Score: Flowsheet Row Admission (Current) from 12/24/2021 in [place] Most recent reading at 12/24/2021  6:06 PM ED from 12/24/2021 in [facility] Most recent reading at 12/24/2021  1:54 PM ED to Hosp-Admission (Discharged) from 06/20/2021 in [place] Most recent reading at 06/20/2021 10:37 AM  C-SSRS RISK CATEGORY High Risk High Risk No Risk       Last PHQ 2/9 Scores:    10/24/2021    6:10 PM 10/16/2021    5:08 PM 10/03/2021    9:07 AM  Depression screen PHQ 2/9  Decreased Interest _0 Down, Depressed, Hopeless _1 PHQ - 2 Score _2 Altered sleeping _3 Tired, decreased energy _4 Change in appetite 2 0 0  Feeling bad or failure about yourself  2 0 1  Trouble concentrating 2 0 0  Moving slowly or fidgety/restless 0 0 0  Suicidal thoughts 1 0 0  PHQ-9 Score _5 Difficult doing work/chores Very difficult  Very difficult    Scribe for Treatment Team: Shirl Harris, LCSW 12/25/2021 9:34 AM

## 2021-12-25 NOTE — Progress Notes (Signed)
Patient isolative to her room this evening.  She is quiet and reserved. She is med compliant and received her medication without incident. Her blood sugar level was 211 this evening for which she received 2 units of short acting along with her standard 30 units of long acting insulin. She endorses depression and is tearful while conversing with staff. Support and encouragement was offered.  Will continue e to monitor with q 15 minute safety checks.     C Butler-Nicholson, LPN

## 2021-12-25 NOTE — Plan of Care (Signed)
  Problem: Clinical Measurements: Goal: Ability to maintain clinical measurements within normal limits will improve Outcome: Progressing Goal: Will remain free from infection Outcome: Progressing   Problem: Clinical Measurements: Goal: Will remain free from infection Outcome: Progressing   Problem: Nutrition: Goal: Adequate nutrition will be maintained Outcome: Progressing   Problem: Coping: Goal: Level of anxiety will decrease Outcome: Progressing
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
## 2021-12-25 NOTE — Progress Notes (Signed)
Recreation Therapy Notes  Date: 12/25/2021   Time: 10:45 am     Location: Craft room     Behavioral response: N/A   Intervention Topic: Decision Making    Discussion/Intervention: Patient refused to attend group.    Clinical Observations/Feedback:  Patient refused to attend group.    Bernardette Waldron LRT/CTRS          Kerry Webb 12/25/2021 10:54 AM

## 2021-12-26 LAB — GLUCOSE, CAPILLARY
Glucose-Capillary: 131 mg/dL — ABNORMAL HIGH (ref 70–99)
Glucose-Capillary: 117 mg/dL — ABNORMAL HIGH (ref 70–99)
Glucose-Capillary: 123 mg/dL — ABNORMAL HIGH (ref 70–99)
Glucose-Capillary: 227 mg/dL — ABNORMAL HIGH (ref 70–99)

## 2021-12-26 MED ORDER — BUPROPION HCL ER (XL) 150 MG PO TB24
150.0000 mg | ORAL_TABLET | Freq: Every day | ORAL | Status: DC
  Administered 2021-12-26 – 2021-12-27 (×2): 150 mg via ORAL
  Filled 2021-12-26 (×2): qty 1

## 2021-12-26 NOTE — Progress Notes (Signed)
[facility] MD Progress Note  12/26/2021 3:44 PM Kerry Webb  MRN:  [id number] Subjective: Follow-up for 56 year old woman with depression.  Patient seen and chart reviewed.  Patient reports she is feeling a little bit better.  Still depressed but not as hopeless.  She denies having any suicidal thoughts now and articulates a positive plan for the future.  She is not having any side effects or complaints regarding medication.  Patient has been up out of bed part of the day at least today.  Affect is still dysphoric and tired.  Patient feels that she would be better off however being at home where she could start to work on her problems. Principal Problem: Severe recurrent major depression without psychotic features ([place]) Diagnosis: Principal Problem:   Severe recurrent major depression without psychotic features ([place]) Active Problems:   Primary hypertension   Diabetes mellitus without complication ([place])  Total Time spent with patient: 30 minutes  Past Psychiatric History: Past history of depression  Past Medical History:  Past Medical History:  Diagnosis Date   Diabetes mellitus without complication ([place])    GERD (gastroesophageal reflux disease)    Heart murmur 1967   Hypertension 1993   Lump or mass in breast    Left   Seizures ([place]) 1983   Ulcer 1985    Past Surgical History:  Procedure Laterality Date   ABDOMINAL HYSTERECTOMY  2004   APPENDECTOMY  2014   BREAST BIOPSY Right 01-23-14   benign   BREAST SURGERY Left 01/14/12   BREAST SURGERY Right 05-16-14   benign   CHOLECYSTECTOMY N/A 05/18/2019   Procedure: LAPAROSCOPIC CHOLECYSTECTOMY;  Surgeon: Benjamine Sprague, DO;  Location: [facility] ORS;  Service: General;  Laterality: N/A;   COLONOSCOPY  2013   Dr. Candace Cruise   Family History:  Family History  Problem Relation Age of Onset   Liver cancer Mother    Cancer Mother        liver   Thyroid disease Mother    Kidney disease Father    Alcohol abuse Father    Other Brother         auto accident   Diabetes Maternal Aunt    Breast cancer Maternal Aunt    Cancer Maternal Grandmother        breast   Hypertension Paternal Grandmother    Kidney disease Paternal Grandfather    Hypertension Paternal Grandfather    Family Psychiatric  History: See previous Social History:  Social History   Substance and Sexual Activity  Alcohol Use Not Currently   Alcohol/week: 1.0 standard drink of alcohol   Types: 1 Glasses of wine per week   Comment: last use 03/2021     Social History   Substance and Sexual Activity  Drug Use Never    Social History   Socioeconomic History   Marital status: Married    Spouse name: Not on file   Number of children: Not on file   Years of education: Not on file   Highest education level: Not on file  Occupational History   Not on file  Tobacco Use   Smoking status: Never   Smokeless tobacco: Never  Vaping Use   Vaping Use: Never used  Substance and Sexual Activity   Alcohol use: Not Currently    Alcohol/week: 1.0 standard drink of alcohol    Types: 1 Glasses of wine per week    Comment: last use 03/2021   Drug use: Never   Sexual activity: Not on file  Other Topics Concern   Not on file  Social History Narrative   Not on file   Social Determinants of Health   Financial Resource Strain: Not on file  Food Insecurity: Food Insecurity Present (06/13/2021)   Hunger Vital Sign    Worried About Running Out of Food in the Last Year: Sometimes true    Ran Out of Food in the Last Year: Sometimes true  Transportation Needs: No Transportation Needs (06/13/2021)   PRAPARE - Hydrologist (Medical): No    Lack of Transportation (Non-Medical): No  Physical Activity: Not on file  Stress: Not on file  Social Connections: Not on file   Additional Social History:                         Sleep: Fair  Appetite:  Fair  Current Medications: Current Facility-Administered Medications  Medication Dose  Route Frequency Provider Last Rate Last Admin   acetaminophen (TYLENOL) tablet 650 mg  650 mg Oral Q6H PRN Sherlon Handing, NP       alum & mag hydroxide-simeth (MAALOX/MYLANTA) 200-200-20 MG/5ML suspension 30 mL  30 mL Oral Q4H PRN Waldon Merl F, NP       atorvastatin (LIPITOR) tablet 40 mg  40 mg Oral Daily Waldon Merl F, NP   40 mg at 12/26/21 1914   dapagliflozin propanediol (FARXIGA) tablet 10 mg  10 mg Oral q morning Waldon Merl F, NP   10 mg at 12/26/21 7829   gabapentin (NEURONTIN) capsule 300 mg  300 mg Oral QHS Waldon Merl F, NP   300 mg at 12/25/21 2119   hydrALAZINE (APRESOLINE) tablet 50 mg  50 mg Oral BID Waldon Merl F, NP   50 mg at 12/26/21 0825   hydrOXYzine (ATARAX) tablet 25 mg  25 mg Oral TID PRN Sherlon Handing, NP   25 mg at 12/26/21 0824   insulin aspart (novoLOG) injection 0-5 Units  0-5 Units Subcutaneous QHS Waldon Merl F, NP   2 Units at 12/24/21 2116   insulin glargine-yfgn (SEMGLEE) injection 30 Units  30 Units Subcutaneous QHS Waldon Merl F, NP   30 Units at 12/25/21 2138   levETIRAcetam (KEPPRA) tablet 500 mg  500 mg Oral BID Waldon Merl F, NP   500 mg at 12/26/21 0825   losartan (COZAAR) tablet 100 mg  100 mg Oral Daily Waldon Merl F, NP   100 mg at 12/26/21 5621   magnesium hydroxide (MILK OF MAGNESIA) suspension 30 mL  30 mL Oral Daily PRN Waldon Merl F, NP       metFORMIN (GLUCOPHAGE-XR) 24 hr tablet 1,000 mg  1,000 mg Oral Q breakfast Waldon Merl F, NP   1,000 mg at 12/26/21 0824   metoprolol tartrate (LOPRESSOR) tablet 50 mg  50 mg Oral Daily Waldon Merl F, NP   50 mg at 12/26/21 3086   sertraline (ZOLOFT) tablet 100 mg  100 mg Oral Daily Waldon Merl F, NP   100 mg at 12/26/21 5784    Lab Results:  Results for orders placed or performed during the hospital encounter of 12/24/21 (from the past 48 hour(s))  Glucose, capillary     Status: Abnormal   Collection Time: 12/24/21  9:10 PM  Result  Value Ref Range   Glucose-Capillary 211 (H) 70 - 99 mg/dL    Comment: Glucose reference range applies only to samples taken after fasting for at least 8 hours.  Glucose,  capillary     Status: Abnormal   Collection Time: 12/25/21  6:59 AM  Result Value Ref Range   Glucose-Capillary 180 (H) 70 - 99 mg/dL    Comment: Glucose reference range applies only to samples taken after fasting for at least 8 hours.  Glucose, capillary     Status: Abnormal   Collection Time: 12/25/21 11:29 AM  Result Value Ref Range   Glucose-Capillary 165 (H) 70 - 99 mg/dL    Comment: Glucose reference range applies only to samples taken after fasting for at least 8 hours.  Glucose, capillary     Status: Abnormal   Collection Time: 12/25/21  4:24 PM  Result Value Ref Range   Glucose-Capillary 152 (H) 70 - 99 mg/dL    Comment: Glucose reference range applies only to samples taken after fasting for at least 8 hours.  Glucose, capillary     Status: Abnormal   Collection Time: 12/25/21  9:17 PM  Result Value Ref Range   Glucose-Capillary 114 (H) 70 - 99 mg/dL    Comment: Glucose reference range applies only to samples taken after fasting for at least 8 hours.  Glucose, capillary     Status: Abnormal   Collection Time: 12/26/21  6:43 AM  Result Value Ref Range   Glucose-Capillary 131 (H) 70 - 99 mg/dL    Comment: Glucose reference range applies only to samples taken after fasting for at least 8 hours.  Glucose, capillary     Status: Abnormal   Collection Time: 12/26/21 11:51 AM  Result Value Ref Range   Glucose-Capillary 117 (H) 70 - 99 mg/dL    Comment: Glucose reference range applies only to samples taken after fasting for at least 8 hours.    Blood Alcohol level:  No results found for: "ETH"  Metabolic Disorder Labs: Lab Results  Component Value Date   HGBA1C 8.9 (A) 09/18/2021   MPG 214.47 04/29/2021   MPG 171.42 05/18/2019   No results found for: "PROLACTIN" Lab Results  Component Value Date   CHOL  256 (H) 06/19/2021   TRIG 325 (H) 06/19/2021   HDL 34 (L) 06/19/2021   CHOLHDL 7.5 (H) 06/19/2021   LDLCALC 160 (H) 06/19/2021    Physical Findings: AIMS:  , ,  ,  ,    CIWA:    COWS:     Musculoskeletal: Strength & Muscle Tone: within normal limits Gait & Station: normal Patient leans: N/A  Psychiatric Specialty Exam:  Presentation  General Appearance: Appropriate for Environment  Eye Contact:Good  Speech:Clear and Coherent  Speech Volume:Normal  Handedness:No data recorded  Mood and Affect  Mood:Depressed  Affect:Congruent; Tearful   Thought Process  Thought Processes:Coherent  Descriptions of Associations:Intact  Orientation:Full (Time, Place and Person)  Thought Content:Logical  History of Schizophrenia/Schizoaffective disorder:No  Duration of Psychotic Symptoms:No data recorded Hallucinations:No data recorded Ideas of Reference:None  Suicidal Thoughts:No data recorded Homicidal Thoughts:No data recorded  Sensorium  Memory:Immediate Good  Judgment:Good  Insight:Good   Executive Functions  Concentration:Good  Attention Span:Good  [place] of Knowledge:Good  Language:Good   Psychomotor Activity  Psychomotor Activity:No data recorded  Assets  Assets:Communication Skills; Desire for Improvement; Financial Resources/Insurance; Housing; Social Support; Resilience; Physical Health   Sleep  Sleep:No data recorded   Physical Exam: Physical Exam Vitals and nursing note reviewed.  Constitutional:      Appearance: Normal appearance.  HENT:     Head: Normocephalic and atraumatic.     Mouth/Throat:     Pharynx: Oropharynx  is clear.  Eyes:     Pupils: Pupils are equal, round, and reactive to light.  Cardiovascular:     Rate and Rhythm: Normal rate and regular rhythm.  Pulmonary:     Effort: Pulmonary effort is normal.     Breath sounds: Normal breath sounds.  Abdominal:     General: Abdomen is flat.     Palpations:  Abdomen is soft.  Musculoskeletal:        General: Normal range of motion.  Skin:    General: Skin is warm and dry.  Neurological:     General: No focal deficit present.     Mental Status: She is alert. Mental status is at baseline.  Psychiatric:        Attention and Perception: Attention normal.        Mood and Affect: Mood is depressed. Affect is blunt.        Speech: Speech normal.        Behavior: Behavior is cooperative.        Thought Content: Thought content normal. Thought content does not include suicidal ideation.        Cognition and Memory: Cognition normal.    Review of Systems  Constitutional: Negative.   HENT: Negative.    Eyes: Negative.   Respiratory: Negative.    Cardiovascular: Negative.   Gastrointestinal: Negative.   Musculoskeletal: Negative.   Skin: Negative.   Neurological: Negative.   Psychiatric/Behavioral:  Positive for depression. Negative for hallucinations, memory loss, substance abuse and suicidal ideas. The patient is nervous/anxious. The patient does not have insomnia.    Blood pressure (!) 151/92, pulse 72, temperature 98 F (36.7 C), temperature source Oral, resp. rate 18, height '5\' 4"'$  (1.626 m), weight 83.9 kg, SpO2 98 %. Body mass index is 31.76 kg/m.   Treatment Plan Summary: Medication management and Plan I would like to increase her Wellbutrin to a normal-sized dosage.  We will tentatively plan on discharge tomorrow with outpatient follow-up with therapy and outpatient psychiatric care.  Encourage group attendance.  Alethia Berthold, MD 12/26/2021, 3:44 PM

## 2021-12-26 NOTE — Plan of Care (Signed)
D- Patient alert and oriented. Patient presented in an anxious, but pleasant mood on assessment reporting that she slept "good" last night and had no complaints to voice to this Probation officer. Patient denied depression, however, she did endorse anxiety stating that "just being here" has her feeling this way. Patient also denied SI, HI, AVH, and pain at this time. Patient's goal for today is "going home", in which she will "talk about the reason why I'm here", in order to achieve her goal.  A- Scheduled medications administered to patient, per MD orders. Support and encouragement provided.  Routine safety checks conducted every 15 minutes.  Patient informed to notify staff with problems or concerns.  R- No adverse drug reactions noted. Patient contracts for safety at this time. Patient compliant with medications and treatment plan. Patient receptive, calm, and cooperative. Patient interacts well with others on the unit.  Patient remains safe at this time.  Problem: Education: Goal: Knowledge of General Education information will improve Description: Including pain rating scale, medication(s)/side effects and non-pharmacologic comfort measures Outcome: Progressing   Problem: Health Behavior/Discharge Planning: Goal: Ability to manage health-related needs will improve Outcome: Progressing   Problem: Clinical Measurements: Goal: Ability to maintain clinical measurements within normal limits will improve Outcome: Progressing Goal: Will remain free from infection Outcome: Progressing Goal: Diagnostic test results will improve Outcome: Progressing Goal: Respiratory complications will improve Outcome: Progressing Goal: Cardiovascular complication will be avoided Outcome: Progressing   Problem: Activity: Goal: Risk for activity intolerance will decrease Outcome: Progressing   Problem: Nutrition: Goal: Adequate nutrition will be maintained Outcome: Progressing   Problem: Coping: Goal: Level of  anxiety will decrease Outcome: Progressing   Problem: Elimination: Goal: Will not experience complications related to bowel motility Outcome: Progressing Goal: Will not experience complications related to urinary retention Outcome: Progressing   Problem: Pain Managment: Goal: General experience of comfort will improve Outcome: Progressing   Problem: Safety: Goal: Ability to remain free from injury will improve Outcome: Progressing   Problem: Skin Integrity: Goal: Risk for impaired skin integrity will decrease Outcome: Progressing

## 2021-12-26 NOTE — Progress Notes (Signed)
Recreation Therapy Notes  INPATIENT RECREATION THERAPY ASSESSMENT  Patient Details Name: DIETRA STOKELY MRN: [id number] DOB: July 07, 1966 Today's Date: 12/26/2021       Information Obtained From: Patient  Able to Participate in Assessment/Interview: Yes  Patient Presentation: Responsive  Reason for Admission (Per Patient): Active Symptoms  Patient Stressors: Family  Coping Skills:   Building control surveyor, Prayer  Leisure Interests (2+):  Individual - Reading, Music - Listen (Reading)  Frequency of Recreation/Participation: Monthly  Awareness of Community Resources:  Yes  Community Resources:  Park  Current Use: Yes  If no, Barriers?:    Expressed Interest in [place]: Yes  County of Residence:  [place]  Patient Main Form of Transportation: Car  Patient Strengths:  [place]  Patient Identified Areas of Improvement:  Stop stress over everything  Patient Goal for Hospitalization:  Reduce stress.  Current SI (including self-harm):  No  Current HI:  No  Current AVH: No  Staff Intervention Plan: Group Attendance, Collaborate with Interdisciplinary Treatment Team  Consent to Intern Participation: N/A  Virlan Kempker 12/26/2021, 11:38 AM

## 2021-12-26 NOTE — Progress Notes (Signed)
Recreation Therapy Notes  Date: 12/26/2021  Time: 10:10 am    Location: Craft room    Behavioral response: N/A   Intervention Topic: Stress Management    Discussion/Intervention: Patient refused to attend group.   Clinical Observations/Feedback:  Patient refused to attend group.    Sherod Cisse LRT/CTRS        [name] 12/26/2021 10:51 AM

## 2021-12-26 NOTE — Progress Notes (Signed)
Recreation Therapy Notes  INPATIENT RECREATION TR PLAN  Patient Details Name: Kerry Webb MRN: [id number] DOB: [birth year] Today's Date: 12/26/2021  Rec Therapy Plan Is patient appropriate for Therapeutic Recreation?: Yes Treatment times per week: at least 3 Estimated Length of Stay: 5-7 days TR Treatment/Interventions: Group participation (Comment)  Discharge Criteria Pt will be discharged from therapy if:: Discharged Treatment plan/goals/alternatives discussed and agreed upon by:: Patient/family  Discharge Summary     Kerry Webb 12/26/2021, 11:39 AM

## 2021-12-26 NOTE — Group Note (Signed)
[facility] LCSW Group Therapy Note   Group Date: 12/26/2021 Start Time: 1300 End Time: 1400   Type of Therapy/Topic:  Group Therapy:  Balance in Life  Participation Level:  Did Not Attend   Description of Group:    This group will address the concept of balance and how it feels and looks when one is unbalanced. Patients will be encouraged to process areas in their lives that are out of balance, and identify reasons for remaining unbalanced. Facilitators will guide patients utilizing problem- solving interventions to address and correct the stressor making their life unbalanced. Understanding and applying boundaries will be explored and addressed for obtaining  and maintaining a balanced life. Patients will be encouraged to explore ways to assertively make their unbalanced needs known to significant others in their lives, using other group members and facilitator for support and feedback.  Therapeutic Goals: Patient will identify two or more emotions or situations they have that consume much of in their lives. Patient will identify signs/triggers that life has become out of balance:  Patient will identify two ways to set boundaries in order to achieve balance in their lives:  Patient will demonstrate ability to communicate their needs through discussion and/or role plays  Summary of Patient Progress: X  Therapeutic Modalities:   Cognitive Behavioral Therapy Solution-Focused Therapy Assertiveness Training   Rozann Lesches, LCSW

## 2021-12-27 LAB — GLUCOSE, CAPILLARY: Glucose-Capillary: 165 mg/dL — ABNORMAL HIGH (ref 70–99)

## 2021-12-27 MED ORDER — HYDRALAZINE HCL 50 MG PO TABS: 50.0000 mg | ORAL_TABLET | Freq: Two times a day (BID) | ORAL | 1 refills | Status: DC

## 2021-12-27 MED ORDER — HYDROXYZINE HCL 25 MG PO TABS: 25.0000 mg | ORAL_TABLET | Freq: Three times a day (TID) | ORAL | 1 refills | Status: DC | PRN

## 2021-12-27 MED ORDER — INSULIN GLARGINE-YFGN 100 UNIT/ML ~~LOC~~ SOLN: 30.0000 [IU] | Freq: Every day | SUBCUTANEOUS | 1 refills | Status: DC

## 2021-12-27 MED ORDER — LOSARTAN POTASSIUM 100 MG PO TABS: 100.0000 mg | ORAL_TABLET | Freq: Every day | ORAL | 1 refills | Status: DC

## 2021-12-27 MED ORDER — DAPAGLIFLOZIN PROPANEDIOL 10 MG PO TABS: 10.0000 mg | ORAL_TABLET | Freq: Every morning | ORAL | 1 refills | Status: DC

## 2021-12-27 MED ORDER — GABAPENTIN 300 MG PO CAPS: 300.0000 mg | ORAL_CAPSULE | Freq: Every day | ORAL | 1 refills | Status: DC

## 2021-12-27 MED ORDER — ATORVASTATIN CALCIUM 40 MG PO TABS: 40.0000 mg | ORAL_TABLET | Freq: Every day | ORAL | 1 refills | Status: DC

## 2021-12-27 MED ORDER — BUPROPION HCL ER (XL) 150 MG PO TB24: 150.0000 mg | ORAL_TABLET | Freq: Every day | ORAL | 1 refills | Status: DC

## 2021-12-27 MED ORDER — SERTRALINE HCL 100 MG PO TABS: 100.0000 mg | ORAL_TABLET | Freq: Every day | ORAL | 1 refills | Status: DC

## 2021-12-27 MED ORDER — METFORMIN HCL ER (OSM) 1000 MG PO TB24
1000.0000 mg | ORAL_TABLET | Freq: Every day | ORAL | 1 refills | Status: DC
Start: 2021-12-28 — End: 2022-02-06

## 2021-12-27 MED ORDER — METOPROLOL TARTRATE 50 MG PO TABS: 50.0000 mg | ORAL_TABLET | Freq: Every day | ORAL | 1 refills | Status: DC

## 2021-12-27 MED ORDER — LEVETIRACETAM 500 MG PO TABS: 500.0000 mg | ORAL_TABLET | Freq: Two times a day (BID) | ORAL | 1 refills | Status: DC

## 2021-12-27 NOTE — Discharge Summary (Signed)
Physician Discharge Summary Note  Patient:  Kerry Webb is an 56 y.o., female MRN:  [id number] DOB:  1965/11/06 Patient phone:  [phone] (home)  Patient address:   [address] [place] [address],  Total Time spent with patient: 30 minutes  Date of Admission:  12/24/2021 Date of Discharge: 12/27/2021  Reason for Admission: Patient was admitted because of suicidal ideation in the context of severe depression  Principal Problem: Severe recurrent major depression without psychotic features [facility]) Discharge Diagnoses: Principal Problem:   Severe recurrent major depression without psychotic features (HCC) Active Problems:   Primary hypertension   Diabetes mellitus without complication (HCC)   Past Psychiatric History: History of recurrent depression recent worsening  Past Medical History:  Past Medical History:  Diagnosis Date   Diabetes mellitus without complication (HCC)    GERD (gastroesophageal reflux disease)    Heart murmur 1967   Hypertension 1993   Lump or mass in breast    Left   Seizures (HCC) 1983   Ulcer 1985    Past Surgical History:  Procedure Laterality Date   ABDOMINAL HYSTERECTOMY  2004   APPENDECTOMY  2014   BREAST BIOPSY Right 01-23-14   benign   BREAST SURGERY Left 01/14/12   BREAST SURGERY Right 05-16-14   benign   CHOLECYSTECTOMY N/A 05/18/2019   Procedure: LAPAROSCOPIC CHOLECYSTECTOMY;  Surgeon: Sung Amabile, DO;  Location: [facility] ORS;  Service: General;  Laterality: N/A;   COLONOSCOPY  2013   Dr. Bluford Kaufmann   Family History:  Family History  Problem Relation Age of Onset   Liver cancer Mother    Cancer Mother        liver   Thyroid disease Mother    Kidney disease Father    Alcohol abuse Father    Other Brother        auto accident   Diabetes Maternal Aunt    Breast cancer Maternal Aunt    Cancer Maternal Grandmother        breast   Hypertension Paternal Grandmother    Kidney disease Paternal Grandfather    Hypertension  Paternal Grandfather    Family Psychiatric  History: See previous Social History:  Social History   Substance and Sexual Activity  Alcohol Use Not Currently   Alcohol/week: 1.0 standard drink of alcohol   Types: 1 Glasses of wine per week   Comment: last use 03/2021     Social History   Substance and Sexual Activity  Drug Use Never    Social History   Socioeconomic History   Marital status: Married    Spouse name: Not on file   Number of children: Not on file   Years of education: Not on file   Highest education level: Not on file  Occupational History   Not on file  Tobacco Use   Smoking status: Never   Smokeless tobacco: Never  Vaping Use   Vaping Use: Never used  Substance and Sexual Activity   Alcohol use: Not Currently    Alcohol/week: 1.0 standard drink of alcohol    Types: 1 Glasses of wine per week    Comment: last use 03/2021   Drug use: Never   Sexual activity: Not on file  Other Topics Concern   Not on file  Social History Narrative   Not on file   Social Determinants of Health   Financial Resource Strain: Not on file  Food Insecurity: Food Insecurity Present (06/13/2021)   Hunger Vital Sign    Worried About  Running Out of Food in the Last Year: Sometimes true    Ran Out of Food in the Last Year: Sometimes true  Transportation Needs: No Transportation Needs (06/13/2021)   PRAPARE - Administrator, Civil Service (Medical): No    Lack of Transportation (Non-Medical): No  Physical Activity: Not on file  Stress: Not on file  Social Connections: Not on file    Hospital Course: Patient admitted to psychiatric unit.  Continued on 15-minute precautions.  Met with treatment team.  Patient participated appropriately in groups and activities.  She did not have any suicidal intent or plan in the hospital.  She seemed very appropriate and wanting to make progress in treating her depression.  Antidepressants were adjusted after discussing options by  adding bupropion.  Patient at this time is requesting discharge stating she has no suicidal intent or plan and would feel more comfortable at home.  Agrees to follow-up plans for therapy and medication management.  Prescriptions provided at discharge.  Physical Findings: AIMS:  , ,  ,  ,    CIWA:    COWS:     Musculoskeletal: Strength & Muscle Tone: within normal limits Gait & Station: normal Patient leans: N/A   Psychiatric Specialty Exam:  Presentation  General Appearance: Appropriate for Environment  Eye Contact:Good  Speech:Clear and Coherent  Speech Volume:Normal  Handedness:No data recorded  Mood and Affect  Mood:Depressed  Affect:Congruent; Tearful   Thought Process  Thought Processes:Coherent  Descriptions of Associations:Intact  Orientation:Full (Time, Place and Person)  Thought Content:Logical  History of Schizophrenia/Schizoaffective disorder:No  Duration of Psychotic Symptoms:No data recorded Hallucinations:No data recorded Ideas of Reference:None  Suicidal Thoughts:No data recorded Homicidal Thoughts:No data recorded  Sensorium  Memory:Immediate Good  Judgment:Good  Insight:Good   Executive Functions  Concentration:Good  Attention Span:Good  Recall:Good  Fund of Knowledge:Good  Language:Good   Psychomotor Activity  Psychomotor Activity:No data recorded  Assets  Assets:Communication Skills; Desire for Improvement; Financial Resources/Insurance; Housing; Social Support; Resilience; Physical Health   Sleep  Sleep:No data recorded   Physical Exam: Physical Exam Vitals and nursing note reviewed.  Constitutional:      Appearance: Normal appearance.  HENT:     Head: Normocephalic and atraumatic.     Mouth/Throat:     Pharynx: Oropharynx is clear.  Eyes:     Pupils: Pupils are equal, round, and reactive to light.  Cardiovascular:     Rate and Rhythm: Normal rate and regular rhythm.  Pulmonary:     Effort: Pulmonary  effort is normal.     Breath sounds: Normal breath sounds.  Abdominal:     General: Abdomen is flat.     Palpations: Abdomen is soft.  Musculoskeletal:        General: Normal range of motion.  Skin:    General: Skin is warm and dry.  Neurological:     General: No focal deficit present.     Mental Status: She is alert. Mental status is at baseline.  Psychiatric:        Mood and Affect: Mood normal.        Thought Content: Thought content normal.    ROS Blood pressure (!) 148/96, pulse 98, temperature 98.4 F (36.9 C), temperature source Oral, resp. rate 18, height 5\' 4"  (1.626 m), weight 83.9 kg, SpO2 100 %. Body mass index is 31.76 kg/m.   Social History   Tobacco Use  Smoking Status Never  Smokeless Tobacco Never   Tobacco Cessation:  N/A, patient  does not currently use tobacco products   Blood Alcohol level:  No results found for: "ETH"  Metabolic Disorder Labs:  Lab Results  Component Value Date   HGBA1C 8.9 (A) 09/18/2021   MPG 214.47 04/29/2021   MPG 171.42 05/18/2019   No results found for: "PROLACTIN" Lab Results  Component Value Date   CHOL 256 (H) 06/19/2021   TRIG 325 (H) 06/19/2021   HDL 34 (L) 06/19/2021   CHOLHDL 7.5 (H) 06/19/2021   LDLCALC 160 (H) 06/19/2021    See Psychiatric Specialty Exam and Suicide Risk Assessment completed by Attending Physician prior to discharge.  Discharge destination:  Home  Is patient on multiple antipsychotic therapies at discharge:  No   Has Patient had three or more failed trials of antipsychotic monotherapy by history:  No  Recommended Plan for Multiple Antipsychotic Therapies: NA  Discharge Instructions     Diet - low sodium heart healthy   Complete by: As directed    Increase activity slowly   Complete by: As directed       Allergies as of 12/27/2021       Reactions   Cashew Nut Oil Shortness Of Breath   Shellfish Allergy Shortness Of Breath   Asa [aspirin]    Intestinal bleeding         Medication List     STOP taking these medications    Basaglar KwikPen 100 UNIT/ML Replaced by: insulin glargine-yfgn 100 UNIT/ML injection       TAKE these medications      Indication  atorvastatin 40 MG tablet Commonly known as: LIPITOR Take 1 tablet (40 mg total) by mouth once daily.    buPROPion 150 MG 24 hr tablet Commonly known as: WELLBUTRIN XL Take 1 tablet (150 mg total) by mouth daily. Start taking on: December 28, 2021  Indication: Major Depressive Disorder   Comfort EZ Pen Needles 32G X 4 MM Misc Generic drug: Insulin Pen Needle Use as directed with insulin pen  Indication: Diabetes   dapagliflozin propanediol 10 MG Tabs tablet Commonly known as: FARXIGA Take 1 tablet (10 mg total) by mouth every morning. Start taking on: December 28, 2021  Indication: Type 2 Diabetes   gabapentin 300 MG capsule Commonly known as: NEURONTIN Take 1 capsule (300 mg total) by mouth once nightly at bedtime.    hydrALAZINE 50 MG tablet Commonly known as: APRESOLINE Take 1 tablet (50 mg total) by mouth 2 (two) times daily.  Indication: High Blood Pressure Disorder   hydrOXYzine 25 MG tablet Commonly known as: ATARAX Take 1 tablet (25 mg total) by mouth 3 (three) times daily as needed for anxiety (or sleep).  Indication: Feeling Anxious   insulin glargine-yfgn 100 UNIT/ML injection Commonly known as: SEMGLEE Inject 0.3 mLs (30 Units total) into the skin at bedtime. Replaces: Basaglar KwikPen 100 UNIT/ML  Indication: Type 2 Diabetes   levETIRAcetam 500 MG tablet Commonly known as: KEPPRA Take 1 tablet (500 mg total) by mouth 2 (two) times daily.  Indication: Seizure   losartan 100 MG tablet Commonly known as: COZAAR Take 1 tablet (100 mg total) by mouth once daily.    metformin 1000 MG (OSM) 24 hr tablet Commonly known as: FORTAMET Take 1 tablet (1,000 mg total) by mouth daily with breakfast. Start taking on: December 28, 2021 What changed: medication strength   Indication: Type 2 Diabetes   metoprolol tartrate 50 MG tablet Commonly known as: LOPRESSOR Take 1 tablet (50 mg total) by mouth daily. Start taking on:  December 28, 2021  Indication: High Blood Pressure Disorder   Rightest GL300 Lancets Misc USE AS DIRECTED TO CHECK BLOOD SUGAR UP TO 4 TIMES DAILY.  Indication: Diabetes   Rightest GM550 Blood Glucose w/Device Kit USE AS DIRECTED TO CHECK BLOOD SUGAR UP TO 4 TIMES DAILY.  Indication: Diabetes   Rightest GS550 Blood Glucose test strip Generic drug: glucose blood USE AS DIRECTED TO CHECK BLOOD SUGAR UP TO 4 TIMES DAILY.  Indication: Diabetes   sertraline 100 MG tablet Commonly known as: ZOLOFT Take 1 tablet (100 mg total) by mouth once daily.         Follow-up Information     [facility] Follow up.   Why: Your appointment is set for Monday, 12/30/21 at 11am. Thanks Contact information: [address] [place] [address] [phone]                 Follow-up recommendations: Follow up with [facility].  Continue current medicine.  Follow-up with primary care doctor.  Seek therapy for depression as well as medication management  Comments: Patient agrees to plan.  Prescriptions provided.  Signed: Mordecai Rasmussen, MD 12/27/2021, 10:39 AM

## 2021-12-27 NOTE — Plan of Care (Signed)
  Problem: Group Participation Goal: STG - Patient will engage in groups without prompting or encouragement from LRT x3 group sessions within 5 recreation therapy group sessions Description: STG - Patient will engage in groups without prompting or encouragement from LRT x3 group sessions within 5 recreation therapy group sessions 12/27/2021 1240 by Alveria Apley, LRT Outcome: Not Applicable 12/27/2021 1239 by Alveria Apley, LRT Outcome: Not Met (add Reason) Note: Patient did not attend any groups.

## 2021-12-27 NOTE — Progress Notes (Signed)
Pt does not want to have a CBG done. She is discharging. Torrie Mayers RN

## 2021-12-27 NOTE — Progress Notes (Signed)
Pt denies SI, HI and AVH. Pt was educated on dc plan and verbalizes understanding. Pt received AVS and DC packet, prescriptions and belongings. Torrie Mayers RN

## 2022-01-09 ENCOUNTER — Other Ambulatory Visit: Payer: Self-pay

## 2022-01-29 ENCOUNTER — Emergency Department (EMERGENCY_DEPARTMENT_HOSPITAL)
Admission: EM | Admit: 2022-01-29 | Discharge: 2022-01-30 | Disposition: A | Discharge status: Other Acute Inpatient Hospital | Payer: BC Managed Care – PPO | Source: Home / Self Care | Attending: Emergency Medicine | Admitting: Emergency Medicine

## 2022-01-29 ENCOUNTER — Other Ambulatory Visit: Payer: Self-pay

## 2022-01-29 DIAGNOSIS — E119 Type 2 diabetes mellitus without complications: Secondary | ICD-10-CM | POA: Insufficient documentation

## 2022-01-29 DIAGNOSIS — T50902A Poisoning by unspecified drugs, medicaments and biological substances, intentional self-harm, initial encounter: Secondary | ICD-10-CM

## 2022-01-29 DIAGNOSIS — F4323 Adjustment disorder with mixed anxiety and depressed mood: Secondary | ICD-10-CM | POA: Insufficient documentation

## 2022-01-29 DIAGNOSIS — G40909 Epilepsy, unspecified, not intractable, without status epilepticus: Secondary | ICD-10-CM | POA: Insufficient documentation

## 2022-01-29 DIAGNOSIS — Z87898 Personal history of other specified conditions: Secondary | ICD-10-CM

## 2022-01-29 DIAGNOSIS — N6452 Nipple discharge: Secondary | ICD-10-CM | POA: Insufficient documentation

## 2022-01-29 DIAGNOSIS — T426X2A Poisoning by other antiepileptic and sedative-hypnotic drugs, intentional self-harm, initial encounter: Secondary | ICD-10-CM | POA: Insufficient documentation

## 2022-01-29 DIAGNOSIS — R55 Syncope and collapse: Secondary | ICD-10-CM | POA: Insufficient documentation

## 2022-01-29 DIAGNOSIS — X838XXA Intentional self-harm by other specified means, initial encounter: Secondary | ICD-10-CM | POA: Insufficient documentation

## 2022-01-29 DIAGNOSIS — I10 Essential (primary) hypertension: Secondary | ICD-10-CM | POA: Insufficient documentation

## 2022-01-29 DIAGNOSIS — R569 Unspecified convulsions: Secondary | ICD-10-CM

## 2022-01-29 DIAGNOSIS — F341 Dysthymic disorder: Secondary | ICD-10-CM | POA: Insufficient documentation

## 2022-01-29 DIAGNOSIS — F332 Major depressive disorder, recurrent severe without psychotic features: Secondary | ICD-10-CM | POA: Insufficient documentation

## 2022-01-29 DIAGNOSIS — R45851 Suicidal ideations: Secondary | ICD-10-CM | POA: Diagnosis not present

## 2022-01-29 DIAGNOSIS — R4589 Other symptoms and signs involving emotional state: Secondary | ICD-10-CM | POA: Diagnosis present

## 2022-01-29 DIAGNOSIS — Z20822 Contact with and (suspected) exposure to covid-19: Secondary | ICD-10-CM | POA: Insufficient documentation

## 2022-01-29 DIAGNOSIS — F32A Depression, unspecified: Secondary | ICD-10-CM

## 2022-01-29 LAB — URINALYSIS, ROUTINE W REFLEX MICROSCOPIC
Bilirubin Urine: NEGATIVE
Glucose, UA: 50 mg/dL — AB
Hgb urine dipstick: NEGATIVE
Ketones, ur: NEGATIVE mg/dL
Leukocytes,Ua: NEGATIVE
Nitrite: NEGATIVE
Protein, ur: 100 mg/dL — AB
Specific Gravity, Urine: 1.015 (ref 1.005–1.030)
pH: 7 (ref 5.0–8.0)

## 2022-01-29 LAB — URINE DRUG SCREEN, QUALITATIVE (ARMC ONLY)
Amphetamines, Ur Screen: NOT DETECTED
Barbiturates, Ur Screen: NOT DETECTED
Benzodiazepine, Ur Scrn: NOT DETECTED
Cannabinoid 50 Ng, Ur ~~LOC~~: NOT DETECTED
Cocaine Metabolite,Ur ~~LOC~~: NOT DETECTED
MDMA (Ecstasy)Ur Screen: NOT DETECTED
Methadone Scn, Ur: NOT DETECTED
Opiate, Ur Screen: NOT DETECTED
Phencyclidine (PCP) Ur S: NOT DETECTED
Tricyclic, Ur Screen: NOT DETECTED

## 2022-01-29 LAB — COMPREHENSIVE METABOLIC PANEL
ALT: 22 U/L (ref 0–44)
AST: 18 U/L (ref 15–41)
Albumin: 4.1 g/dL (ref 3.5–5.0)
Alkaline Phosphatase: 162 U/L — ABNORMAL HIGH (ref 38–126)
Anion gap: 7 (ref 5–15)
BUN: 10 mg/dL (ref 6–20)
CO2: 26 mmol/L (ref 22–32)
Calcium: 9.3 mg/dL (ref 8.9–10.3)
Chloride: 106 mmol/L (ref 98–111)
Creatinine, Ser: 0.75 mg/dL (ref 0.44–1.00)
GFR, Estimated: >60 mL/min (ref >60)
Glucose, Bld: 285 mg/dL — ABNORMAL HIGH (ref 70–99)
Potassium: 3.4 mmol/L — ABNORMAL LOW (ref 3.5–5.1)
Sodium: 139 mmol/L (ref 135–145)
Total Bilirubin: 0.6 mg/dL (ref 0.3–1.2)
Total Protein: 7.5 g/dL (ref 6.5–8.1)

## 2022-01-29 LAB — CBC
HCT: 46.7 % — ABNORMAL HIGH (ref 36.0–46.0)
Hemoglobin: 15.2 g/dL — ABNORMAL HIGH (ref 12.0–15.0)
MCH: 28.6 pg (ref 26.0–34.0)
MCHC: 32.5 g/dL (ref 30.0–36.0)
MCV: 87.8 fL (ref 80.0–100.0)
Platelets: 305 10*3/uL (ref 150–400)
RBC: 5.32 MIL/uL — ABNORMAL HIGH (ref 3.87–5.11)
RDW: 13.2 % (ref 11.5–15.5)
WBC: 8.9 10*3/uL (ref 4.0–10.5)
nRBC: 0 % (ref 0.0–0.2)

## 2022-01-29 LAB — SARS CORONAVIRUS 2 BY RT PCR: SARS Coronavirus 2 by RT PCR: NEGATIVE

## 2022-01-29 LAB — ETHANOL: Alcohol, Ethyl (B): <10 mg/dL (ref <10)

## 2022-01-29 LAB — ACETAMINOPHEN LEVEL: Acetaminophen (Tylenol), Serum: <10 ug/mL — ABNORMAL LOW (ref 10–30)

## 2022-01-29 LAB — SALICYLATE LEVEL: Salicylate Lvl: <7 mg/dL — ABNORMAL LOW (ref 7.0–30.0)
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
## 2022-01-29 NOTE — ED Notes (Signed)
Poison control cleared

## 2022-01-29 NOTE — Progress Notes (Signed)
   01/29/22 1510  Clinical Encounter Type  Visited With Patient  Visit Type Initial;Spiritual support;Social support  Referral From Other (Comment) (care team)  Consult/Referral To [place] visited with pt per referral from care team.  Chaplain B established a relationship of care and support. Chaplain offered non-anxious, compassionate presence and active listening to gain understanding of events that have created crisis for pt at this time (financial hardship, need to move/loss of home).  Chaplain B also provided validation of emotion (ongoing grief from loss of marriage 2016) and of past trauma within family (father). Intervention offered by [facility] not helpful due to being in group format with men, not one on one/too vulnerable.  Chaplain B also gained insight into pt's spiritual beliefs and engaged her faith as a source of strength. Chaplain B offered prayer and fostered a sense of groundedness and safety.  Will plan to f/u 7/27. Pt did not endorse any further desire for self-harm

## 2022-01-29 NOTE — ED Notes (Signed)
Pt belongings sent with pt

## 2022-01-29 NOTE — ED Notes (Signed)
Poison control contacted recommend may be sedated and unsteady. No other side effects expected at this time. EKG and 4 hr tylenol recommended. Monitor here in the ED for 4 hrs looking for CNS depression and can be cleared if she is at her baseline.

## 2022-01-29 NOTE — ED Notes (Addendum)
Black tennis shoes Socks  Black dress Silver cross And gold necklace Black bra Silver hoop earrings

## 2022-01-29 NOTE — Consult Note (Signed)
[facility] Face-to-Face Psychiatry Consult   Reason for Consult: Drug Overdose Referring Physician: Dr. Archie Balboa Patient Identification: Kerry Webb MRN:  [id number] Principal Diagnosis: <principal problem not specified> Diagnosis:  Active Problems:   History of nipple discharge   Syncope   History of seizure   Dysphoric mood   Diabetes mellitus without complication ([place])   Hypertension   Seizures ([place])   Depression   Adjustment disorder with mixed anxiety and depressed mood   Total Time spent with patient: 1 hour  Subjective: "I threw-up some of the pills." Kerry Webb is a 56 y.o. female patient presented to [facility] ED via law enforcement voluntary. The patient stated she has been going through "a lot." She said she has fallen behind her bills and she will have to give up her home where she lived for three years and go live with her aunt three hours away. She states it has been stressful for her, and today she overdosed on gabapentin, taking 5-300 mg.   This provider saw The patient face-to-face; the chart was reviewed, and consulted with Dr. Archie Balboa on 01/29/2022 due to the patient's care. It was discussed with the EDP that the patient does meet the criteria to be admitted to the psychiatric inpatient unit.  On evaluation, the patient is alert and oriented x 4, calm, sad but cooperative, and mood-congruent with affect. The patient does not appear to be responding to internal or external stimuli. Neither is the patient presenting with any delusional thinking. The patient denies auditory or visual hallucinations. The patient admits to suicidal ideation but denies homicidal or self-harm ideations. The patient is not presenting with any psychotic or paranoid behaviors. During an encounter with the patient, she could answer questions appropriately.  HPI: Per Dr. Archie Balboa, Kerry Webb is a 56 y.o. female who presents to the emergency department today because of concern for overdose.  The patient states that around 11 am this morning she took 5 gabapentin pills. She did this because she felt tired and did not want to have to start over again. The patient is apparently moving out of where she currently lives and moving in with a relative around 3 hours away. She does state that shortly after taking the pill she realized she did not want to hurt herself so made herself vomit and felt like she got 2 of the pills up. She denies any medical complaints at this time.   Past Psychiatric History:  Seizures ([place])  Risk to Self:   Risk to Others:   Prior Inpatient Therapy:   Prior Outpatient Therapy:    Past Medical History:  Past Medical History:  Diagnosis Date   Diabetes mellitus without complication ([place])    GERD (gastroesophageal reflux disease)    Heart murmur 1967   Hypertension 1993   Lump or mass in breast    Left   Seizures ([place]) 1983   Ulcer 1985    Past Surgical History:  Procedure Laterality Date   ABDOMINAL HYSTERECTOMY  2004   APPENDECTOMY  2014   BREAST BIOPSY Right 01-23-14   benign   BREAST SURGERY Left 01/14/12   BREAST SURGERY Right 05-16-14   benign   CHOLECYSTECTOMY N/A 05/18/2019   Procedure: LAPAROSCOPIC CHOLECYSTECTOMY;  Surgeon: Benjamine Sprague, DO;  Location: [facility] ORS;  Service: General;  Laterality: N/A;   COLONOSCOPY  2013   Dr. Candace Cruise   Family History:  Family History  Problem Relation Age of Onset   Liver cancer Mother  Cancer Mother        liver   Thyroid disease Mother    Kidney disease Father    Alcohol abuse Father    Other Brother        auto accident   Diabetes Maternal Aunt    Breast cancer Maternal Aunt    Cancer Maternal Grandmother        breast   Hypertension Paternal Grandmother    Kidney disease Paternal Grandfather    Hypertension Paternal Grandfather    Family Psychiatric  History:  Social History:  Social History   Substance and Sexual Activity  Alcohol Use Not Currently   Alcohol/week: 1.0 standard drink  of alcohol   Types: 1 Glasses of wine per week   Comment: last use 03/2021     Social History   Substance and Sexual Activity  Drug Use Never    Social History   Socioeconomic History   Marital status: Married    Spouse name: Not on file   Number of children: Not on file   Years of education: Not on file   Highest education level: Not on file  Occupational History   Not on file  Tobacco Use   Smoking status: Never   Smokeless tobacco: Never  Vaping Use   Vaping Use: Never used  Substance and Sexual Activity   Alcohol use: Not Currently    Alcohol/week: 1.0 standard drink of alcohol    Types: 1 Glasses of wine per week    Comment: last use 03/2021   Drug use: Never   Sexual activity: Not on file  Other Topics Concern   Not on file  Social History Narrative   Not on file   Social Determinants of Health   Financial Resource Strain: Not on file  Food Insecurity: Food Insecurity Present (06/13/2021)   Hunger Vital Sign    Worried About Running Out of Food in the Last Year: Sometimes true    Ran Out of Food in the Last Year: Sometimes true  Transportation Needs: No Transportation Needs (06/13/2021)   PRAPARE - Hydrologist (Medical): No    Lack of Transportation (Non-Medical): No  Physical Activity: Not on file  Stress: Not on file  Social Connections: Not on file   Additional Social History:    Allergies:   Allergies  Allergen Reactions   Cashew Nut Oil Shortness Of Breath   Shellfish Allergy Shortness Of Breath   Asa [Aspirin]     Intestinal bleeding    Labs:  Results for orders placed or performed during the hospital encounter of 01/29/22 (from the past 48 hour(s))  CBC     Status: Abnormal   Collection Time: 01/29/22  3:04 PM  Result Value Ref Range   WBC 8.9 4.0 - 10.5 K/uL   RBC 5.32 (H) 3.87 - 5.11 MIL/uL   Hemoglobin 15.2 (H) 12.0 - 15.0 g/dL   HCT 46.7 (H) 36.0 - 46.0 %   MCV 87.8 80.0 - 100.0 fL   MCH 28.6 26.0 -  34.0 pg   MCHC 32.5 30.0 - 36.0 g/dL   RDW 13.2 11.5 - 15.5 %   Platelets 305 150 - 400 K/uL   nRBC 0.0 0.0 - 0.2 %    Comment: Performed at [facility], [address] [place], [place] [address]  Comprehensive metabolic panel     Status: Abnormal   Collection Time: 01/29/22  3:04 PM  Result Value Ref Range   Sodium  139 135 - 145 mmol/L   Potassium 3.4 (L) 3.5 - 5.1 mmol/L   Chloride 106 98 - 111 mmol/L   CO2 26 22 - 32 mmol/L   Glucose, Bld 285 (H) 70 - 99 mg/dL    Comment: Glucose reference range applies only to samples taken after fasting for at least 8 hours.   BUN 10 6 - 20 mg/dL   Creatinine, Ser 0.75 0.44 - 1.00 mg/dL   Calcium 9.3 8.9 - 10.3 mg/dL   Total Protein 7.5 6.5 - 8.1 g/dL   Albumin 4.1 3.5 - 5.0 g/dL   AST 18 15 - 41 U/L   ALT 22 0 - 44 U/L   Alkaline Phosphatase 162 (H) 38 - 126 U/L   Total Bilirubin 0.6 0.3 - 1.2 mg/dL   GFR, Estimated >60 >60 mL/min    Comment: (NOTE) Calculated using the CKD-EPI Creatinine Equation (2021)    Anion gap 7 5 - 15    Comment: Performed at [facility], [address] [place], [place] [address]  Ethanol     Status: None   Collection Time: 01/29/22  3:04 PM  Result Value Ref Range   Alcohol, Ethyl (B) <10 <10 mg/dL    Comment: (NOTE) Lowest detectable limit for serum alcohol is 10 mg/dL.  For medical purposes only. Performed at [facility], [address] [place], [place] [address]   Acetaminophen level     Status: Abnormal   Collection Time: 01/29/22  3:04 PM  Result Value Ref Range   Acetaminophen (Tylenol), Serum <10 (L) 10 - 30 ug/mL    Comment: (NOTE) Therapeutic concentrations vary significantly. A range of 10-30 ug/mL  may be an effective concentration for many patients. However, some  are best treated at concentrations outside of this range. Acetaminophen concentrations >150 ug/mL at 4 hours after ingestion  and >50 ug/mL at 12 hours after ingestion are often associated with   toxic reactions.  Performed at [facility], [address] [place], [place] [address]   Salicylate level     Status: Abnormal   Collection Time: 01/29/22  3:04 PM  Result Value Ref Range   Salicylate Lvl <5.8 (L) 7.0 - 30.0 mg/dL    Comment: Performed at [facility], [address] [place], [place] [address]  Urinalysis, Routine w reflex microscopic Urine, Clean Catch     Status: Abnormal   Collection Time: 01/29/22  9:09 PM  Result Value Ref Range   Color, Urine YELLOW (A) YELLOW   APPearance HAZY (A) CLEAR   Specific Gravity, Urine 1.015 1.005 - 1.030   pH 7.0 5.0 - 8.0   Glucose, UA 50 (A) NEGATIVE mg/dL   Hgb urine dipstick NEGATIVE NEGATIVE   Bilirubin Urine NEGATIVE NEGATIVE   Ketones, ur NEGATIVE NEGATIVE mg/dL   Protein, ur 100 (A) NEGATIVE mg/dL   Nitrite NEGATIVE NEGATIVE   Leukocytes,Ua NEGATIVE NEGATIVE   RBC / HPF 0-5 0 - 5 RBC/hpf   WBC, UA 0-5 0 - 5 WBC/hpf   Bacteria, UA FEW (A) NONE SEEN   Squamous Epithelial / LPF 0-5 0 - 5   Mucus PRESENT    Hyaline Casts, UA PRESENT     Comment: Performed at [facility], [address] [place], [place] [address]  Urine Drug Screen, Qualitative ([facility] only)     Status: None   Collection Time: 01/29/22  9:09 PM  Result Value Ref Range   Tricyclic, Ur Screen NONE DETECTED NONE DETECTED  Amphetamines, Ur Screen NONE DETECTED NONE DETECTED   MDMA (Ecstasy)Ur Screen NONE DETECTED NONE DETECTED   Cocaine Metabolite,Ur [place] NONE DETECTED NONE DETECTED   Opiate, Ur Screen NONE DETECTED NONE DETECTED   Phencyclidine (PCP) Ur S NONE DETECTED NONE DETECTED   Cannabinoid 50 Ng, Ur  NONE DETECTED NONE DETECTED   Barbiturates, Ur Screen NONE DETECTED NONE DETECTED   Benzodiazepine, Ur Scrn NONE DETECTED NONE DETECTED   Methadone Scn, Ur NONE DETECTED NONE DETECTED    Comment: (NOTE) Tricyclics + metabolites, urine    Cutoff 1000 ng/mL Amphetamines + metabolites, urine  Cutoff 1000 ng/mL MDMA  (Ecstasy), urine              Cutoff 500 ng/mL Cocaine Metabolite, urine          Cutoff 300 ng/mL Opiate + metabolites, urine        Cutoff 300 ng/mL Phencyclidine (PCP), urine         Cutoff 25 ng/mL Cannabinoid, urine                 Cutoff 50 ng/mL Barbiturates + metabolites, urine  Cutoff 200 ng/mL Benzodiazepine, urine              Cutoff 200 ng/mL Methadone, urine                   Cutoff 300 ng/mL  The urine drug screen provides only a preliminary, unconfirmed analytical test result and should not be used for non-medical purposes. Clinical consideration and professional judgment should be applied to any positive drug screen result due to possible interfering substances. A more specific alternate chemical method must be used in order to obtain a confirmed analytical result. Gas chromatography / mass spectrometry (GC/MS) is the preferred confirm atory method. Performed at [facility], [address] [place], [place] [address]   SARS Coronavirus 2 by RT PCR (hospital order, performed in [facility] hospital lab) *cepheid single result test* Anterior Nasal Swab     Status: None   Collection Time: 01/29/22 10:05 PM   Specimen: Anterior Nasal Swab  Result Value Ref Range   SARS Coronavirus 2 by RT PCR NEGATIVE NEGATIVE    Comment: Performed at [facility], [address] [place], [place] [address]    No current facility-administered medications for this encounter.   Current Outpatient Medications  Medication Sig Dispense Refill   atorvastatin (LIPITOR) 40 MG tablet Take 1 tablet (40 mg total) by mouth once daily. 30 tablet 1   blood glucose meter kit and supplies KIT USE AS DIRECTED TO CHECK BLOOD SUGAR UP TO 4 TIMES DAILY. 1 each 0   buPROPion (WELLBUTRIN XL) 150 MG 24 hr tablet Take 1 tablet (150 mg total) by mouth daily. 30 tablet 1   dapagliflozin propanediol (FARXIGA) 10 MG TABS tablet Take 1 tablet (10 mg total) by mouth every morning. 30 tablet 1    gabapentin (NEURONTIN) 300 MG capsule Take 1 capsule (300 mg total) by mouth once nightly at bedtime. 30 capsule 1   glucose blood (RIGHTEST GS550 BLOOD GLUCOSE) test strip USE AS DIRECTED TO CHECK BLOOD SUGAR UP TO 4 TIMES DAILY. 100 strip 0   hydrALAZINE (APRESOLINE) 50 MG tablet Take 1 tablet (50 mg total) by mouth 2 (two) times daily. 60 tablet 1   hydrOXYzine (ATARAX) 25 MG tablet Take 1 tablet (25 mg total) by mouth 3 (three) times daily as needed for anxiety (or sleep). 30 tablet 1  insulin glargine-yfgn (SEMGLEE) 100 UNIT/ML injection Inject 0.3 mLs (30 Units total) into the skin at bedtime. 10 mL 1   Insulin Pen Needle 32G X 4 MM MISC Use as directed with insulin pen 100 each 0   levETIRAcetam (KEPPRA) 500 MG tablet Take 1 tablet (500 mg total) by mouth 2 (two) times daily. 60 tablet 1   losartan (COZAAR) 100 MG tablet Take 1 tablet (100 mg total) by mouth once daily. 30 tablet 1   metFORMIN (FORTAMET) 1000 MG (OSM) 24 hr tablet Take 1 tablet (1,000 mg total) by mouth daily with breakfast. 30 tablet 1   metoprolol tartrate (LOPRESSOR) 50 MG tablet Take 1 tablet (50 mg total) by mouth daily. 30 tablet 1   Rightest GL300 Lancets MISC USE AS DIRECTED TO CHECK BLOOD SUGAR UP TO 4 TIMES DAILY. 100 each 0   sertraline (ZOLOFT) 100 MG tablet Take 1 tablet (100 mg total) by mouth once daily. 30 tablet 1    Musculoskeletal: Strength & Muscle Tone: within normal limits Gait & Station: normal Patient leans: N/A  Psychiatric Specialty Exam:  Presentation  General Appearance: Appropriate for Environment  Eye Contact:Good  Speech:Clear and Coherent  Speech Volume:Normal  Handedness:Right   Mood and Affect  Mood:Depressed; Hopeless  Affect:Congruent   Thought Process  Thought Processes:Coherent  Descriptions of Associations:Intact  Orientation:Full (Time, Place and Person)  Thought Content:Logical  History of Schizophrenia/Schizoaffective disorder:No  Duration of  Psychotic Symptoms:No data recorded Hallucinations:Hallucinations: None  Ideas of Reference:None  Suicidal Thoughts:SI Active Intent and/or Plan: With Intent; With Plan; With Means to [place]; With Access to Means  Homicidal Thoughts:Homicidal Thoughts: No   Sensorium  Memory:Immediate Good; Remote Good; Recent Good  Judgment:Good  Insight:Good   Executive Functions  Concentration:Good  Attention Span:Good  [place] of Knowledge:Good  Language:Good   Psychomotor Activity  Psychomotor Activity:Psychomotor Activity: Normal   Assets  Assets:Desire for Improvement; Leisure Time; Physical Health; Resilience; Social Support; Housing; Financial Resources/Insurance   Sleep  Sleep:Sleep: Fair   Physical Exam: Physical Exam Vitals and nursing note reviewed.  Constitutional:      Appearance: Normal appearance. She is normal weight.  HENT:     Head: Normocephalic and atraumatic.     Right Ear: External ear normal.     Left Ear: External ear normal.     Nose: Nose normal.  Cardiovascular:     Rate and Rhythm: Normal rate.     Pulses: Normal pulses.  Pulmonary:     Effort: Pulmonary effort is normal.  Musculoskeletal:        General: Normal range of motion.     Cervical back: Normal range of motion and neck supple.  Neurological:     General: No focal deficit present.     Mental Status: She is alert and oriented to person, place, and time.  Psychiatric:        Attention and Perception: Attention and perception normal.        Mood and Affect: Mood is depressed. Affect is labile, blunt and flat.        Speech: Speech normal.        Behavior: Behavior is withdrawn. Behavior is cooperative.        Thought Content: Thought content includes suicidal ideation. Thought content includes suicidal plan.        Cognition and Memory: Cognition and memory normal.        Judgment: Judgment is impulsive.    Review of Systems  Psychiatric/Behavioral:  Positive  for depression and suicidal ideas. The patient is nervous/anxious.   All other systems reviewed and are negative.  Blood pressure (!) 154/90, pulse 68, temperature 98.6 F (37 C), temperature source Oral, resp. rate 20, height 5' 5"  (1.651 m), weight 85.7 kg, SpO2 100 %. Body mass index is 31.45 kg/m.  Treatment Plan Summary: Plan The patient is a safety risk to herself and requires psychiatric inpatient admission for stabilization and treatment.  Disposition: Recommend psychiatric Inpatient admission when medically cleared. Supportive therapy provided about ongoing stressors.  Caroline Sauger, NP 01/29/2022 11:08 PM

## 2022-01-29 NOTE — ED Notes (Signed)
Psych at bedside.

## 2022-01-29 NOTE — BH Assessment (Signed)
Patient is to be admitted to [facility] by Psychiatric Nurse Practitioner Caroline Sauger.  Attending Physician will be Dr.  Weber Cooks .   Patient has been assigned to room 324, by [place].   Intake Paper Work has been signed and placed on patient chart.  ER staff is aware of the admission: [facility] ER Secretary   Dr. Archie Balboa, ER MD  Caryl Pina Patient's Nurse  Clarene Critchley Patient Access.

## 2022-01-29 NOTE — ED Triage Notes (Signed)
Pt instructed to come by police after taking 5-929 mg gabapentin and vomited, states did see 2 of the pills in the vomit. Pt states she was feeling suicidal at the time, denies any SI presently. Pt has hx of the same.

## 2022-01-29 NOTE — ED Provider Notes (Signed)
[facility] Provider Note    Event Date/Time   First MD Initiated Contact with Patient 01/29/22 1535     (approximate)   History   Drug Overdose   HPI  Kerry Webb is a 56 y.o. female who presents to the emergency department today because of concern for overdose. The patient states that around 11 am this morning she took 5 gabapentin pills. She did this because she felt tired and did not want to have to start over again. The patient is apparently moving out of where she currently lives and moving in with a relative around 3 hours away. She does state that shortly after taking the pill she realized she did not want to hurt herself so made herself vomit and felt like she got 2 of the pills up. She denies any medical complaints at this time.    Physical Exam   Triage Vital Signs: ED Triage Vitals  Enc Vitals Group     BP 01/29/22 1502 (!) 197/120     Pulse Rate 01/29/22 1502 68     Resp 01/29/22 1502 20     Temp 01/29/22 1502 98.6 F (37 C)     Temp Source 01/29/22 1502 Oral     SpO2 01/29/22 1502 100 %     Weight 01/29/22 1501 189 lb (85.7 kg)     Height 01/29/22 1501 '5\' 5"'$  (1.651 m)     Head Circumference --      Peak Flow --      Pain Score 01/29/22 1500 0     Pain Loc --      Pain Edu? --      Excl. in [place]? --     Most recent vital signs: Vitals:   01/29/22 1502  BP: (!) 197/120  Pulse: 68  Resp: 20  Temp: 98.6 F (37 C)  SpO2: 100%    General: Awake, alert, oriented. CV:  Good peripheral perfusion.  Resp:  Normal effort.  Abd:  No distention.    ED Results / Procedures / Treatments   Labs (all labs ordered are listed, but only abnormal results are displayed) Labs Reviewed  CBC - Abnormal; Notable for the following components:      Result Value   RBC 5.32 (*)    Hemoglobin 15.2 (*)    HCT 46.7 (*)    All other components within normal limits  COMPREHENSIVE METABOLIC PANEL - Abnormal; Notable for the following  components:   Potassium 3.4 (*)    Glucose, Bld 285 (*)    Alkaline Phosphatase 162 (*)    All other components within normal limits  ACETAMINOPHEN LEVEL - Abnormal; Notable for the following components:   Acetaminophen (Tylenol), Serum <10 (*)    All other components within normal limits  SALICYLATE LEVEL - Abnormal; Notable for the following components:   Salicylate Lvl <5.5 (*)    All other components within normal limits  ETHANOL  URINALYSIS, ROUTINE W REFLEX MICROSCOPIC  URINE DRUG SCREEN, QUALITATIVE ([facility] ONLY)     EKG  I, [name], attending physician, personally viewed and interpreted this EKG  EKG Time: 1515 Rate: 73 Rhythm: normal sinus rhythm Axis: normal Intervals: qtc 423 QRS: narrow ST changes: no st elevation Impression: normal ekg   RADIOLOGY None   PROCEDURES:  Critical Care performed: No  Procedures   MEDICATIONS ORDERED IN ED: Medications - No data to display   IMPRESSION / MDM / [place] / ED COURSE  I reviewed the triage vital signs and the nursing notes.                              Differential diagnosis includes, but is not limited to, intentional overdose, adverse reaction.  Patient's presentation is most consistent with acute presentation with potential threat to life or bodily function.  Patient presented to the emergency department today because of concerns for suicidal ideation and intentional overdose.  Patient states she took 5 of her 300 mg gabapentin at around 11 AM today.  The time my exam patient not displaying any symptoms concerning for significant gabapentin overdose.  While patient does states she has remorse about overdosing and that she at this time does not want to harm her self will still have psychiatry evaluate.  The patient has been placed in psychiatric observation due to the need to provide a safe environment for the patient while obtaining psychiatric consultation and evaluation, as well as  ongoing medical and medication management to treat the patient's condition.  The patient has not been placed under full IVC at this time.   FINAL CLINICAL IMPRESSION(S) / ED DIAGNOSES   Final diagnoses:  Depression, unspecified depression type  Intentional overdose, initial encounter [facility])     Note:  This document was prepared using Dragon voice recognition software and may include unintentional dictation errors.    [name], MD 01/29/22 2027

## 2022-01-30 ENCOUNTER — Inpatient Hospital Stay
Admission: AD | Admit: 2022-01-30 | Discharge: 2022-02-06 | DRG: 885 | Disposition: A | Discharge status: Home / Self Care | Payer: BC Managed Care – PPO | Source: Intra-hospital | Attending: Psychiatry | Admitting: Psychiatry

## 2022-01-30 DIAGNOSIS — Z886 Allergy status to analgesic agent status: Secondary | ICD-10-CM | POA: Diagnosis not present

## 2022-01-30 DIAGNOSIS — Z9049 Acquired absence of other specified parts of digestive tract: Secondary | ICD-10-CM

## 2022-01-30 DIAGNOSIS — Z91018 Allergy to other foods: Secondary | ICD-10-CM | POA: Diagnosis not present

## 2022-01-30 DIAGNOSIS — Z7984 Long term (current) use of oral hypoglycemic drugs: Secondary | ICD-10-CM

## 2022-01-30 DIAGNOSIS — R569 Unspecified convulsions: Secondary | ICD-10-CM | POA: Diagnosis present

## 2022-01-30 DIAGNOSIS — E119 Type 2 diabetes mellitus without complications: Secondary | ICD-10-CM | POA: Diagnosis present

## 2022-01-30 DIAGNOSIS — E1169 Type 2 diabetes mellitus with other specified complication: Secondary | ICD-10-CM

## 2022-01-30 DIAGNOSIS — T426X2A Poisoning by other antiepileptic and sedative-hypnotic drugs, intentional self-harm, initial encounter: Secondary | ICD-10-CM

## 2022-01-30 DIAGNOSIS — Z90710 Acquired absence of both cervix and uterus: Secondary | ICD-10-CM

## 2022-01-30 DIAGNOSIS — F332 Major depressive disorder, recurrent severe without psychotic features: Principal | ICD-10-CM | POA: Diagnosis present

## 2022-01-30 DIAGNOSIS — K219 Gastro-esophageal reflux disease without esophagitis: Secondary | ICD-10-CM | POA: Diagnosis present

## 2022-01-30 DIAGNOSIS — R45851 Suicidal ideations: Secondary | ICD-10-CM | POA: Diagnosis present

## 2022-01-30 DIAGNOSIS — Z79899 Other long term (current) drug therapy: Secondary | ICD-10-CM

## 2022-01-30 DIAGNOSIS — Z87898 Personal history of other specified conditions: Secondary | ICD-10-CM

## 2022-01-30 DIAGNOSIS — F411 Generalized anxiety disorder: Secondary | ICD-10-CM

## 2022-01-30 DIAGNOSIS — I10 Essential (primary) hypertension: Secondary | ICD-10-CM | POA: Diagnosis present

## 2022-01-30 DIAGNOSIS — F4321 Adjustment disorder with depressed mood: Secondary | ICD-10-CM | POA: Diagnosis not present

## 2022-01-30 DIAGNOSIS — Z20822 Contact with and (suspected) exposure to covid-19: Secondary | ICD-10-CM | POA: Diagnosis present

## 2022-01-30 DIAGNOSIS — Z794 Long term (current) use of insulin: Secondary | ICD-10-CM

## 2022-01-30 DIAGNOSIS — Z833 Family history of diabetes mellitus: Secondary | ICD-10-CM

## 2022-01-30 DIAGNOSIS — R41843 Psychomotor deficit: Secondary | ICD-10-CM | POA: Diagnosis present

## 2022-01-30 DIAGNOSIS — Z8249 Family history of ischemic heart disease and other diseases of the circulatory system: Secondary | ICD-10-CM

## 2022-01-30 LAB — GLUCOSE, CAPILLARY
Glucose-Capillary: 276 mg/dL — ABNORMAL HIGH (ref 70–99)
Glucose-Capillary: 171 mg/dL — ABNORMAL HIGH (ref 70–99)
Glucose-Capillary: 372 mg/dL — ABNORMAL HIGH (ref 70–99)
Glucose-Capillary: 204 mg/dL — ABNORMAL HIGH (ref 70–99)
Glucose-Capillary: 319 mg/dL — ABNORMAL HIGH (ref 70–99)

## 2022-01-30 MED ORDER — DAPAGLIFLOZIN PROPANEDIOL 10 MG PO TABS
10.0000 mg | ORAL_TABLET | Freq: Every day | ORAL | Status: DC
Start: 2022-01-31 — End: 2022-02-06
  Administered 2022-01-31 – 2022-02-06 (×7): 10 mg via ORAL
  Filled 2022-01-30 (×7): qty 1

## 2022-01-30 MED ORDER — SERTRALINE HCL 100 MG PO TABS
100.0000 mg | ORAL_TABLET | Freq: Every day | ORAL | Status: DC
  Administered 2022-01-30 – 2022-02-06 (×8): 100 mg via ORAL
  Filled 2022-01-30 (×8): qty 1

## 2022-01-30 MED ORDER — GABAPENTIN 300 MG PO CAPS
300.0000 mg | ORAL_CAPSULE | Freq: Every day | ORAL | Status: DC
  Administered 2022-01-30 – 2022-02-05 (×7): 300 mg via ORAL
  Filled 2022-01-30 (×7): qty 1

## 2022-01-30 MED ORDER — ATORVASTATIN CALCIUM 20 MG PO TABS
40.0000 mg | ORAL_TABLET | Freq: Every day | ORAL | Status: DC
  Administered 2022-01-30 – 2022-02-05 (×7): 40 mg via ORAL
  Filled 2022-01-30 (×7): qty 2

## 2022-01-30 MED ORDER — HYDROXYZINE HCL 25 MG PO TABS
25.0000 mg | ORAL_TABLET | Freq: Three times a day (TID) | ORAL | Status: DC | PRN
  Administered 2022-01-30 (×3): 25 mg via ORAL
  Filled 2022-01-30 (×2): qty 1

## 2022-01-30 MED ORDER — LOSARTAN POTASSIUM 50 MG PO TABS
100.0000 mg | ORAL_TABLET | Freq: Every day | ORAL | Status: DC
  Administered 2022-01-30 – 2022-02-06 (×8): 100 mg via ORAL
  Filled 2022-01-30 (×9): qty 2

## 2022-01-30 MED ORDER — GLUCOSE BLOOD VI STRP: 1.0000 | ORAL_STRIP | Freq: Three times a day (TID) | Status: DC

## 2022-01-30 MED ORDER — METOPROLOL TARTRATE 25 MG PO TABS
50.0000 mg | ORAL_TABLET | Freq: Every day | ORAL | Status: DC
  Administered 2022-01-30 – 2022-02-06 (×8): 50 mg via ORAL
  Filled 2022-01-30 (×8): qty 2

## 2022-01-30 MED ORDER — INSULIN GLARGINE-YFGN 100 UNIT/ML ~~LOC~~ SOLN
30.0000 [IU] | Freq: Every day | SUBCUTANEOUS | Status: DC
  Administered 2022-01-30 – 2022-02-05 (×8): 30 [IU] via SUBCUTANEOUS
  Filled 2022-01-30 (×9): qty 0.3

## 2022-01-30 MED ORDER — BLOOD GLUCOSE MONITOR KIT: PACK | Freq: Three times a day (TID) | Status: DC

## 2022-01-30 MED ORDER — HYDRALAZINE HCL 50 MG PO TABS: 50.0000 mg | ORAL_TABLET | Freq: Two times a day (BID) | ORAL | Status: DC

## 2022-01-30 MED ORDER — LOSARTAN POTASSIUM 50 MG PO TABS: 100.0000 mg | ORAL_TABLET | Freq: Every day | ORAL | Status: DC

## 2022-01-30 MED ORDER — MAGNESIUM HYDROXIDE 400 MG/5ML PO SUSP: 30.0000 mL | Freq: Every day | ORAL | Status: DC | PRN

## 2022-01-30 MED ORDER — BUPROPION HCL ER (XL) 150 MG PO TB24
300.0000 mg | ORAL_TABLET | Freq: Every day | ORAL | Status: DC
  Administered 2022-01-30 – 2022-02-06 (×8): 300 mg via ORAL
  Filled 2022-01-30 (×8): qty 2

## 2022-01-30 MED ORDER — METFORMIN HCL 500 MG PO TABS
1000.0000 mg | ORAL_TABLET | Freq: Every day | ORAL | Status: DC
  Administered 2022-01-31 – 2022-02-06 (×7): 1000 mg via ORAL
  Filled 2022-01-30 (×7): qty 2

## 2022-01-30 MED ORDER — ACETAMINOPHEN 325 MG PO TABS
650.0000 mg | ORAL_TABLET | Freq: Four times a day (QID) | ORAL | Status: DC | PRN
  Administered 2022-01-30 (×2): 650 mg via ORAL
  Filled 2022-01-30 (×2): qty 2

## 2022-01-30 MED ORDER — ALUM & MAG HYDROXIDE-SIMETH 200-200-20 MG/5ML PO SUSP: 30.0000 mL | ORAL | Status: DC | PRN

## 2022-01-30 MED ORDER — LEVETIRACETAM 500 MG PO TABS
500.0000 mg | ORAL_TABLET | Freq: Two times a day (BID) | ORAL | Status: DC
  Administered 2022-01-30 – 2022-02-06 (×14): 500 mg via ORAL
  Filled 2022-01-30 (×15): qty 1

## 2022-01-30 MED ORDER — HYDRALAZINE HCL 50 MG PO TABS
50.0000 mg | ORAL_TABLET | Freq: Two times a day (BID) | ORAL | Status: DC
  Administered 2022-01-30 – 2022-01-31 (×3): 50 mg via ORAL
  Filled 2022-01-30 (×4): qty 1

## 2022-01-30 NOTE — H&P (Signed)
Psychiatric Admission Assessment Adult  Patient Identification: Kerry Webb MRN:  [id number] Date of Evaluation:  01/30/2022 Chief Complaint:  Adjustment disorder with depressed mood [F43.21] Principal Diagnosis: Adjustment disorder with depressed mood Diagnosis:  Principal Problem:   Adjustment disorder with depressed mood Active Problems:   Primary hypertension   Diabetes mellitus without complication ([place])   Severe recurrent major depression without psychotic features ([place])   Intentional overdose of gabapentin ([place])  History of Present Illness: Patient seen and chart reviewed.  56 year old woman with a history of depression took an overdose of gabapentin about 5 pills.  Threw them up immediately afterwards and came to the hospital seeking attention.  Patient reports ongoing feelings of being overwhelmed and depressed.  Major life stresses including having to move out of her current living situation because of severe financial stress are driving her depression.  Denies psychosis.  Denies any actual wish to die.  Denies substance abuse.  Patient is still taking medication.  Has been to [facility] for therapy appointments but is still seeking a regular provider. Associated Signs/Symptoms: Depression Symptoms:  depressed mood, psychomotor retardation, fatigue, Duration of Depression Symptoms: Greater than two weeks  (Hypo) Manic Symptoms:  Impulsivity, Anxiety Symptoms:  Excessive Worry, Psychotic Symptoms:   None PTSD Symptoms: Negative Total Time spent with patient: 1 hour  Past Psychiatric History: Past history of depression.  History of suicidal ideation.  Severe life stresses.  Just in the hospital recently.  Does have outpatient providers and medication management  Is the patient at risk to self? Yes.    Has the patient been a risk to self in the past 6 months? Yes.    Has the patient been a risk to self within the distant past? No.  Is the patient a risk to others? No.  Has  the patient been a risk to others in the past 6 months? No.  Has the patient been a risk to others within the distant past? No.   [place] Scale:  [facility] Admission (Current) from 01/30/2022 in [place] ED from 01/29/2022 in [place] Admission (Discharged) from 12/24/2021 in [facility] High Risk Error: Q6 is Yes, you must answer 7 High Risk        Prior Inpatient Therapy:   Prior Outpatient Therapy:    Alcohol Screening: 1. How often do you have a drink containing alcohol?: Never 2. How many drinks containing alcohol do you have on a typical day when you are drinking?: 1 or 2 3. How often do you have six or more drinks on one occasion?: Never AUDIT-C Score: 0 4. How often during the last year have you found that you were not able to stop drinking once you had started?: Never 5. How often during the last year have you failed to do what was normally expected from you because of drinking?: Never 6. How often during the last year have you needed a first drink in the morning to get yourself going after a heavy drinking session?: Never 7. How often during the last year have you had a feeling of guilt of remorse after drinking?: Never 8. How often during the last year have you been unable to remember what happened the night before because you had been drinking?: Never 9. Have you or someone else been injured as a result of your drinking?: No 10. Has a relative or friend or a doctor or another health worker been  concerned about your drinking or suggested you cut down?: No Alcohol Use Disorder Identification Test Final Score (AUDIT): 0 Substance Abuse History in the last 12 months:  No. Consequences of Substance Abuse: Negative Previous Psychotropic Medications: Yes  Psychological Evaluations: Yes  Past Medical History:  Past Medical History:  Diagnosis Date   Diabetes  mellitus without complication ([place])    GERD (gastroesophageal reflux disease)    Heart murmur 1967   Hypertension 1993   Lump or mass in breast    Left   Seizures ([place]) 1983   Ulcer 1985    Past Surgical History:  Procedure Laterality Date   ABDOMINAL HYSTERECTOMY  2004   APPENDECTOMY  2014   BREAST BIOPSY Right 01-23-14   benign   BREAST SURGERY Left 01/14/12   BREAST SURGERY Right 05-16-14   benign   CHOLECYSTECTOMY N/A 05/18/2019   Procedure: LAPAROSCOPIC CHOLECYSTECTOMY;  Surgeon: Benjamine Sprague, DO;  Location: [facility] ORS;  Service: General;  Laterality: N/A;   COLONOSCOPY  2013   Dr. Candace Cruise   Family History:  Family History  Problem Relation Age of Onset   Liver cancer Mother    Cancer Mother        liver   Thyroid disease Mother    Kidney disease Father    Alcohol abuse Father    Other Brother        auto accident   Diabetes Maternal Aunt    Breast cancer Maternal Aunt    Cancer Maternal Grandmother        breast   Hypertension Paternal Grandmother    Kidney disease Paternal Grandfather    Hypertension Paternal Grandfather    Family Psychiatric  History: See previous Tobacco Screening:   Social History:  Social History   Substance and Sexual Activity  Alcohol Use Not Currently   Alcohol/week: 1.0 standard drink of alcohol   Types: 1 Glasses of wine per week   Comment: last use 03/2021     Social History   Substance and Sexual Activity  Drug Use Never    Additional Social History: Marital status: Divorced Divorced, when?: 2016 Does patient have children?: Yes How many children?: 2 How is patient's relationship with their children?: "I'm pretty sure they're climbing the walls because I haven't called them yet"                         Allergies:   Allergies  Allergen Reactions   Cashew Nut Oil Shortness Of Breath   Shellfish Allergy Shortness Of Breath   Asa [Aspirin]     Intestinal bleeding   Lab Results:  Results for orders placed or  performed during the hospital encounter of 01/30/22 (from the past 48 hour(s))  Glucose, capillary     Status: Abnormal   Collection Time: 01/30/22 12:59 AM  Result Value Ref Range   Glucose-Capillary 276 (H) 70 - 99 mg/dL    Comment: Glucose reference range applies only to samples taken after fasting for at least 8 hours.  Glucose, capillary     Status: Abnormal   Collection Time: 01/30/22  6:21 AM  Result Value Ref Range   Glucose-Capillary 171 (H) 70 - 99 mg/dL    Comment: Glucose reference range applies only to samples taken after fasting for at least 8 hours.  Glucose, capillary     Status: Abnormal   Collection Time: 01/30/22 12:39 PM  Result Value Ref Range   Glucose-Capillary 372 (H) 70 - 99 mg/dL  Comment: Glucose reference range applies only to samples taken after fasting for at least 8 hours.    Blood Alcohol level:  Lab Results  Component Value Date   ETH <10 46/80/3212    Metabolic Disorder Labs:  Lab Results  Component Value Date   HGBA1C 8.9 (A) 09/18/2021   MPG 214.47 04/29/2021   MPG 171.42 05/18/2019   No results found for: "PROLACTIN" Lab Results  Component Value Date   CHOL 256 (H) 06/19/2021   TRIG 325 (H) 06/19/2021   HDL 34 (L) 06/19/2021   CHOLHDL 7.5 (H) 06/19/2021   LDLCALC 160 (H) 06/19/2021    Current Medications: Current Facility-Administered Medications  Medication Dose Route Frequency Provider Last Rate Last Admin   acetaminophen (TYLENOL) tablet 650 mg  650 mg Oral Q6H PRN Caroline Sauger, NP   650 mg at 01/30/22 0825   alum & mag hydroxide-simeth (MAALOX/MYLANTA) 200-200-20 MG/5ML suspension 30 mL  30 mL Oral Q4H PRN Caroline Sauger, NP       atorvastatin (LIPITOR) tablet 40 mg  40 mg Oral Daily Tsering Leaman, Madie Reno, MD       buPROPion (WELLBUTRIN XL) 24 hr tablet 300 mg  300 mg Oral Daily Ceasia Elwell, Madie Reno, MD       Derrill Memo ON 01/31/2022] dapagliflozin propanediol (FARXIGA) tablet 10 mg  10 mg Oral Daily Shritha Bresee, Madie Reno, MD        gabapentin (NEURONTIN) capsule 300 mg  300 mg Oral QHS Vinette Crites T, MD       hydrALAZINE (APRESOLINE) tablet 50 mg  50 mg Oral BID Caroline Sauger, NP   50 mg at 01/30/22 2482   hydrOXYzine (ATARAX) tablet 25 mg  25 mg Oral TID PRN Caroline Sauger, NP   25 mg at 01/30/22 0928   insulin glargine-yfgn (SEMGLEE) injection 30 Units  30 Units Subcutaneous QHS Caroline Sauger, NP   30 Units at 01/30/22 0100   levETIRAcetam (KEPPRA) tablet 500 mg  500 mg Oral BID Shaunika Italiano, Madie Reno, MD       losartan (COZAAR) tablet 100 mg  100 mg Oral Daily Caroline Sauger, NP   100 mg at 01/30/22 5003   magnesium hydroxide (MILK OF MAGNESIA) suspension 30 mL  30 mL Oral Daily PRN Caroline Sauger, NP       Derrill Memo ON 01/31/2022] metFORMIN (GLUCOPHAGE) tablet 1,000 mg  1,000 mg Oral Q breakfast Jibran Crookshanks, Madie Reno, MD       metoprolol tartrate (LOPRESSOR) tablet 50 mg  50 mg Oral Daily Zoua Caporaso, Madie Reno, MD       sertraline (ZOLOFT) tablet 100 mg  100 mg Oral Daily Lakima Dona, Madie Reno, MD       PTA Medications: Medications Prior to Admission  Medication Sig Dispense Refill Last Dose   atorvastatin (LIPITOR) 40 MG tablet Take 1 tablet (40 mg total) by mouth once daily. 30 tablet 1    blood glucose meter kit and supplies KIT USE AS DIRECTED TO CHECK BLOOD SUGAR UP TO 4 TIMES DAILY. 1 each 0    buPROPion (WELLBUTRIN XL) 150 MG 24 hr tablet Take 1 tablet (150 mg total) by mouth daily. 30 tablet 1    dapagliflozin propanediol (FARXIGA) 10 MG TABS tablet Take 1 tablet (10 mg total) by mouth every morning. 30 tablet 1    gabapentin (NEURONTIN) 300 MG capsule Take 1 capsule (300 mg total) by mouth once nightly at bedtime. 30 capsule 1    glucose blood (RIGHTEST GS550 BLOOD GLUCOSE) test strip USE AS  DIRECTED TO CHECK BLOOD SUGAR UP TO 4 TIMES DAILY. 100 strip 0    hydrALAZINE (APRESOLINE) 50 MG tablet Take 1 tablet (50 mg total) by mouth 2 (two) times daily. 60 tablet 1    hydrOXYzine (ATARAX) 25 MG tablet Take 1  tablet (25 mg total) by mouth 3 (three) times daily as needed for anxiety (or sleep). 30 tablet 1    insulin glargine-yfgn (SEMGLEE) 100 UNIT/ML injection Inject 0.3 mLs (30 Units total) into the skin at bedtime. 10 mL 1    Insulin Pen Needle 32G X 4 MM MISC Use as directed with insulin pen 100 each 0    levETIRAcetam (KEPPRA) 500 MG tablet Take 1 tablet (500 mg total) by mouth 2 (two) times daily. 60 tablet 1    losartan (COZAAR) 100 MG tablet Take 1 tablet (100 mg total) by mouth once daily. 30 tablet 1    metFORMIN (FORTAMET) 1000 MG (OSM) 24 hr tablet Take 1 tablet (1,000 mg total) by mouth daily with breakfast. 30 tablet 1    metoprolol tartrate (LOPRESSOR) 50 MG tablet Take 1 tablet (50 mg total) by mouth daily. 30 tablet 1    Rightest GL300 Lancets MISC USE AS DIRECTED TO CHECK BLOOD SUGAR UP TO 4 TIMES DAILY. 100 each 0    sertraline (ZOLOFT) 100 MG tablet Take 1 tablet (100 mg total) by mouth once daily. 30 tablet 1     Musculoskeletal: Strength & Muscle Tone: within normal limits Gait & Station: normal Patient leans: N/A            Psychiatric Specialty Exam:  Presentation  General Appearance: Appropriate for Environment  Eye Contact:Good  Speech:Clear and Coherent  Speech Volume:Normal  Handedness:Right   Mood and Affect  Mood:Depressed; Hopeless  Affect:Congruent   Thought Process  Thought Processes:Coherent  Duration of Psychotic Symptoms: No data recorded Past Diagnosis of Schizophrenia or Psychoactive disorder: No  Descriptions of Associations:Intact  Orientation:Full (Time, Place and Person)  Thought Content:Logical  Hallucinations:Hallucinations: None  Ideas of Reference:None  Suicidal Thoughts:SI Active Intent and/or Plan: With Intent; With Plan; With Means to [place]; With Access to Means  Homicidal Thoughts:Homicidal Thoughts: No   Sensorium  Memory:Immediate Good; Remote Good; Recent  Good  Judgment:Good  Insight:Good   Executive Functions  Concentration:Good  Attention Span:Good  [place] of Knowledge:Good  Language:Good   Psychomotor Activity  Psychomotor Activity:Psychomotor Activity: Normal   Assets  Assets:Desire for Improvement; Leisure Time; Physical Health; Resilience; Social Support; Housing; Financial Resources/Insurance   Sleep  Sleep:Sleep: Fair    Physical Exam: Physical Exam Vitals and nursing note reviewed.  Constitutional:      Appearance: Normal appearance.  HENT:     Head: Normocephalic and atraumatic.     Mouth/Throat:     Pharynx: Oropharynx is clear.  Eyes:     Pupils: Pupils are equal, round, and reactive to light.  Cardiovascular:     Rate and Rhythm: Normal rate and regular rhythm.  Pulmonary:     Effort: Pulmonary effort is normal.     Breath sounds: Normal breath sounds.  Abdominal:     General: Abdomen is flat.     Palpations: Abdomen is soft.  Musculoskeletal:        General: Normal range of motion.  Skin:    General: Skin is warm and dry.  Neurological:     General: No focal deficit present.     Mental Status: She is alert. Mental status is at baseline.  Psychiatric:        Attention and Perception: Attention normal.        Mood and Affect: Mood is depressed.        Speech: Speech normal.        Behavior: Behavior normal.        Thought Content: Thought content normal.        Cognition and Memory: Cognition normal.        Judgment: Judgment normal.    Review of Systems  Constitutional: Negative.   HENT: Negative.    Eyes: Negative.   Respiratory: Negative.    Cardiovascular: Negative.   Gastrointestinal: Negative.   Musculoskeletal: Negative.   Skin: Negative.   Neurological: Negative.   Psychiatric/Behavioral:  Positive for depression. Negative for hallucinations, memory loss, substance abuse and suicidal ideas. The patient is not nervous/anxious and does not have insomnia.     Blood pressure (!) 165/106, pulse 76, temperature 98.9 F (37.2 C), temperature source Oral, resp. rate 18, height 5' 5"  (1.651 m), weight 85.7 kg, SpO2 98 %. Body mass index is 31.44 kg/m.  Treatment Plan Summary: Medication management and Plan reviewed medication including multiple medicines for diabetes and high blood pressure.  Restart medication for depression but increase Wellbutrin to 300 mg.  Reviewed with patient the importance of follow-up therapy.  We will have treatment team tomorrow and reassess dangerousness.  She is hoping for discharge because of need for taking care of her moving plans  Observation Level/Precautions:  15 minute checks  Laboratory:  Chemistry Profile  Psychotherapy:    Medications:    Consultations:    Discharge Concerns:    Estimated LOS:  Other:     Physician Treatment Plan for Primary Diagnosis: Adjustment disorder with depressed mood Long Term Goal(s): Improvement in symptoms so as ready for discharge  Short Term Goals: Ability to disclose and discuss suicidal ideas, Ability to demonstrate self-control will improve, Ability to identify and develop effective coping behaviors will improve, and Compliance with prescribed medications will improve  Physician Treatment Plan for Secondary Diagnosis: Principal Problem:   Adjustment disorder with depressed mood Active Problems:   Primary hypertension   Diabetes mellitus without complication ([facility])   Severe recurrent major depression without psychotic features ([place])   Intentional overdose of gabapentin ([place])  Long Term Goal(s): Improvement in symptoms so as ready for discharge  Short Term Goals: Compliance with prescribed medications will improve  I certify that inpatient services furnished can reasonably be expected to improve the patient's condition.    Alethia Berthold, MD 7/27/20234:10 PM

## 2022-01-30 NOTE — Plan of Care (Signed)
D: Pt alert and oriented. Pt rates depression 0/10, hopelessness 0/10, and anxiety 5/10. Pt goal: "Going home." Pt reports energy level as normal and concentration as being good. Pt reports sleep last night as being fair. Pt did receive medications for sleep and did find them helpful. Pt reports experiencing 4/10 headache pain at this time, prn meds given. Pt denies experiencing any SI/HI, or AVH at this time.   A: Scheduled medications administered to pt, per MD orders. Support and encouragement provided. Frequent verbal contact made. Routine safety checks conducted q15 minutes.   R: No adverse drug reactions noted. Pt verbally contracts for safety at this time. Pt compliant with medications and treatment plan. Pt interacts well with others on the unit. Pt remains safe at this time. Will continue to monitor.   Problem: Education: Goal: Knowledge of General Education information will improve Description: Including pain rating scale, medication(s)/side effects and non-pharmacologic comfort measures Outcome: Progressing   Problem: Coping: Goal: Level of anxiety will decrease Outcome: Not Progressing

## 2022-01-30 NOTE — Tx Team (Signed)
Initial Treatment Plan 01/30/2022 1:48 AM [name] LAG:[id number]    PATIENT STRESSORS: Financial difficulties   Medication change or noncompliance     PATIENT STRENGTHS: Capable of independent living  Motivation for treatment/growth    PATIENT IDENTIFIED PROBLEMS: Depression  Anxiety  Suicidal Ideation                 DISCHARGE CRITERIA:  Adequate post-discharge living arrangements Verbal commitment to aftercare and medication compliance  PRELIMINARY DISCHARGE PLAN: Outpatient therapy Return to previous living arrangement  PATIENT/FAMILY INVOLVEMENT: This treatment plan has been presented to and reviewed with the patient, Kerry Webb. The patient has been given the opportunity to ask questions and make suggestions.  Mallie Darting, RN 01/30/2022, 1:48 AM

## 2022-01-30 NOTE — Inpatient Diabetes Management (Signed)
Inpatient Diabetes Program Recommendations  AACE/ADA: New Consensus Statement on Inpatient Glycemic Control  Target Ranges:  Prepandial:   less than 140 mg/dL      Peak postprandial:   less than 180 mg/dL (1-2 hours)      Critically ill patients:  140 - 180 mg/dL    Latest Reference Range & Units 01/30/22 00:59 01/30/22 06:21  Glucose-Capillary 70 - 99 mg/dL 276 (H) 171 (H)   Review of Glycemic Control  Diabetes history: DM2 Outpatient Diabetes medications: Farxiga 10 mg daily, Semglee 30 units QHS Current orders for Inpatient glycemic control: Semglee 30 units QHS  Inpatient Diabetes Program Recommendations:    Insulin: Please consider ordering CBGs AC&HS with Novolog 0-9 units TID with meals and Novolog 0-5 units QHS.  Thanks, Barnie Alderman, RN, MSN, [facility] Coordinator Inpatient Diabetes Program [phone] (Team Pager from 8am to [place])

## 2022-01-30 NOTE — Group Note (Signed)
[facility] LCSW Group Therapy Note   Group Date: 01/30/2022 Start Time: 1300 End Time: 1400   Type of Therapy/Topic:  Group Therapy:  Balance in Life  Participation Level:  Did Not Attend   Description of Group:    This group will address the concept of balance and how it feels and looks when one is unbalanced. Patients will be encouraged to process areas in their lives that are out of balance, and identify reasons for remaining unbalanced. Facilitators will guide patients utilizing problem- solving interventions to address and correct the stressor making their life unbalanced. Understanding and applying boundaries will be explored and addressed for obtaining  and maintaining a balanced life. Patients will be encouraged to explore ways to assertively make their unbalanced needs known to significant others in their lives, using other group members and facilitator for support and feedback.  Therapeutic Goals: Patient will identify two or more emotions or situations they have that consume much of in their lives. Patient will identify signs/triggers that life has become out of balance:  Patient will identify two ways to set boundaries in order to achieve balance in their lives:  Patient will demonstrate ability to communicate their needs through discussion and/or role plays  Summary of Patient Progress:    Due to the acuity and complex discharge plans, group was not held. Patient was provided therapeutic worksheets and asked to meet with CSW as needed.    Therapeutic Modalities:   Cognitive Behavioral Therapy Solution-Focused Therapy Assertiveness Training   Rozann Lesches, LCSW

## 2022-01-30 NOTE — Progress Notes (Signed)
   01/30/22 1445  Clinical Encounter Type  Visited With Patient;Health care provider  Visit Type Follow-up;Spiritual support;Social support   Pt was resting but Chaplain B offered a brief f/u to our visit yesterday and just let pt know she was being remembered and that spiritual care support remained ongoing. Brought pt a notebook that is safe for use on unit. Let pt know this chaplain would continue to be available for her support.

## 2022-01-30 NOTE — BHH Suicide Risk Assessment (Signed)
[facility] Admission Suicide Risk Assessment   Nursing information obtained from:  Patient Demographic factors:  Low socioeconomic status, Unemployed Current Mental Status:  Suicidal ideation indicated by patient Loss Factors:  Financial problems / change in socioeconomic status Historical Factors:  Impulsivity Risk Reduction Factors:  Positive social support, Positive therapeutic relationship  Total Time spent with patient: 45 minutes Principal Problem: Adjustment disorder with depressed mood Diagnosis:  Principal Problem:   Adjustment disorder with depressed mood Active Problems:   Primary hypertension   Diabetes mellitus without complication (HCC)   Severe recurrent major depression without psychotic features ([place])   Intentional overdose of gabapentin ([place])  Subjective Data: Patient seen and chart reviewed.  56 year old woman with a history of depression and ongoing social stresses.  Took an overdose of gabapentin intentionally and then appropriately sought treatment.  Patient is denying any wish to die but acknowledges feeling overwhelmed.  Denies psychotic symptoms.  Cooperative with treatment.  Continued Clinical Symptoms:  Alcohol Use Disorder Identification Test Final Score (AUDIT): 0 The "Alcohol Use Disorders Identification Test", Guidelines for Use in Primary Care, Second Edition.  World Pharmacologist [facility]). Score between 0-7:  no or low risk or alcohol related problems. Score between 8-15:  moderate risk of alcohol related problems. Score between 16-19:  high risk of alcohol related problems. Score 20 or above:  warrants further diagnostic evaluation for alcohol dependence and treatment.   CLINICAL FACTORS:   Depression:   Impulsivity   Musculoskeletal: Strength & Muscle Tone: within normal limits Gait & Station: normal Patient leans: N/A  Psychiatric Specialty Exam:  Presentation  General Appearance: Appropriate for Environment  Eye  Contact:Good  Speech:Clear and Coherent  Speech Volume:Normal  Handedness:Right   Mood and Affect  Mood:Depressed; Hopeless  Affect:Congruent   Thought Process  Thought Processes:Coherent  Descriptions of Associations:Intact  Orientation:Full (Time, Place and Person)  Thought Content:Logical  History of Schizophrenia/Schizoaffective disorder:No  Duration of Psychotic Symptoms:No data recorded Hallucinations:Hallucinations: None  Ideas of Reference:None  Suicidal Thoughts:SI Active Intent and/or Plan: With Intent; With Plan; With Means to [place]; With Access to Means  Homicidal Thoughts:Homicidal Thoughts: No   Sensorium  Memory:Immediate Good; Remote Good; Recent Good  Judgment:Good  Insight:Good   Executive Functions  Concentration:Good  Attention Span:Good  [place] of Knowledge:Good  Language:Good   Psychomotor Activity  Psychomotor Activity:Psychomotor Activity: Normal   Assets  Assets:Desire for Improvement; Leisure Time; Physical Health; Resilience; Social Support; Housing; Financial Resources/Insurance   Sleep  Sleep:Sleep: Fair    Physical Exam: Physical Exam Vitals and nursing note reviewed.  Constitutional:      Appearance: Normal appearance.  HENT:     Head: Normocephalic and atraumatic.     Mouth/Throat:     Pharynx: Oropharynx is clear.  Eyes:     Pupils: Pupils are equal, round, and reactive to light.  Cardiovascular:     Rate and Rhythm: Normal rate and regular rhythm.  Pulmonary:     Effort: Pulmonary effort is normal.     Breath sounds: Normal breath sounds.  Abdominal:     General: Abdomen is flat.     Palpations: Abdomen is soft.  Musculoskeletal:        General: Normal range of motion.  Skin:    General: Skin is warm and dry.  Neurological:     General: No focal deficit present.     Mental Status: She is alert. Mental status is at baseline.  Psychiatric:        Attention  and Perception:  Attention normal.        Mood and Affect: Mood is depressed.        Speech: Speech normal.        Behavior: Behavior is cooperative.        Thought Content: Thought content normal. Thought content does not include suicidal ideation.        Cognition and Memory: Cognition normal.        Judgment: Judgment normal.    Review of Systems  Constitutional: Negative.   HENT: Negative.    Eyes: Negative.   Respiratory: Negative.    Cardiovascular: Negative.   Gastrointestinal: Negative.   Musculoskeletal: Negative.   Skin: Negative.   Neurological: Negative.   Psychiatric/Behavioral:  Positive for depression. Negative for hallucinations, memory loss, substance abuse and suicidal ideas. The patient is not nervous/anxious and does not have insomnia.    Blood pressure (!) 165/106, pulse 76, temperature 98.9 F (37.2 C), temperature source Oral, resp. rate 18, height '5\' 5"'$  (1.651 m), weight 85.7 kg, SpO2 98 %. Body mass index is 31.44 kg/m.   COGNITIVE FEATURES THAT CONTRIBUTE TO RISK:  Thought constriction (tunnel vision)    SUICIDE RISK:   Minimal: No identifiable suicidal ideation.  Patients presenting with no risk factors but with morbid ruminations; may be classified as minimal risk based on the severity of the depressive symptoms  PLAN OF CARE: Continue current medications.  Increase Wellbutrin.  Engage in individual and group therapy and treatment team meeting.  Likely discharge within 1 to 2 days.  Reassess dangerousness prior to discharge  I certify that inpatient services furnished can reasonably be expected to improve the patient's condition.   Alethia Berthold, MD 01/30/2022, 4:08 PM

## 2022-01-30 NOTE — BHH Suicide Risk Assessment (Signed)
[place] INPATIENT:  Family/Significant Other Suicide Prevention Education  Suicide Prevention Education:  Patient Refusal for Family/Significant Other Suicide Prevention Education: The patient Kerry Webb has refused to provide written consent for family/significant other to be provided Family/Significant Other Suicide Prevention Education during admission and/or prior to discharge.  Physician notified.  SPE completed with pt, as pt refused to consent to family contact. SPI pamphlet provided to pt and pt was encouraged to share information with support network, ask questions, and talk about any concerns relating to SPE. Pt denies access to guns/firearms and verbalized understanding of information provided. Mobile Crisis information also provided to pt.   Rozann Lesches 01/30/2022, 3:00 PM

## 2022-01-30 NOTE — Progress Notes (Signed)
Recreation Therapy Notes   Date: 01/30/2022   Time: 10:25 am     Location: Court yard    Behavioral response: N/A   Intervention Topic: Wellness    Discussion/Intervention: Patient refused to attend group.    Clinical Observations/Feedback:  Patient refused to attend group.    Davione Lenker LRT/CTRS          Duaa Stelzner 01/30/2022 11:35 AM

## 2022-01-30 NOTE — Progress Notes (Signed)
Patient calm and cooperative during assessment. Pt denies SI/HI/AVH. Pt endorses anxiety and depression. Pt observed interacting appropriately with staff and peers on the unit. Pt compliant with medication administration per MD orders. Pt given education, support and encouragement to be active in her treatment plan. Pt being monitored Q 15 minutes for safety per unit protocol, remains safe on the unit.
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
## 2022-01-30 NOTE — BHH Counselor (Signed)
Patient requested assistance with [employer] for both [place] and [employer].  Assunta Curtis, MSW, LCSW 01/30/2022 2:59 PM

## 2022-01-30 NOTE — Progress Notes (Signed)
Patient admitted from [facility] ED, report received from [place], [place]. Pt calm and pleasant but presents flat and depressed. Pt had a couple crying spells stating that she has to sell everything and move away and she doesn't want to. Pt given support. Pt oriented to the unit and her room. Pt compliant with medication administration per MD orders. Pt given education, support, and encouragement to be active in her treatment plan. Pt being monitored Q 15 minutes for safety per unit protocol. Pt remains safe on the unit.

## 2022-01-30 NOTE — Plan of Care (Signed)
Patient new to the unit tonight, hasn't had time to progress  Problem: Education: Goal: Knowledge of General Education information will improve Description: Including pain rating scale, medication(s)/side effects and non-pharmacologic comfort measures 01/30/2022 0150 by Mallie Darting, RN Outcome: Not Progressing 01/30/2022 0150 by Mallie Darting, RN Outcome: Not Progressing   Problem: Health Behavior/Discharge Planning: Goal: Ability to manage health-related needs will improve 01/30/2022 0150 by Mallie Darting, RN Outcome: Not Progressing 01/30/2022 0150 by Mallie Darting, RN Outcome: Not Progressing   Problem: Clinical Measurements: Goal: Ability to maintain clinical measurements within normal limits will improve 01/30/2022 0150 by Mallie Darting, RN Outcome: Not Progressing 01/30/2022 0150 by Mallie Darting, RN Outcome: Not Progressing Goal: Will remain free from infection 01/30/2022 0150 by Mallie Darting, RN Outcome: Not Progressing 01/30/2022 0150 by Mallie Darting, RN Outcome: Not Progressing Goal: Diagnostic test results will improve 01/30/2022 0150 by Mallie Darting, RN Outcome: Not Progressing 01/30/2022 0150 by Mallie Darting, RN Outcome: Not Progressing Goal: Respiratory complications will improve 01/30/2022 0150 by Mallie Darting, RN Outcome: Not Progressing 01/30/2022 0150 by Mallie Darting, RN Outcome: Not Progressing Goal: Cardiovascular complication will be avoided 01/30/2022 0150 by Mallie Darting, RN Outcome: Not Progressing 01/30/2022 0150 by Mallie Darting, RN Outcome: Not Progressing   Problem: Activity: Goal: Risk for activity intolerance will decrease 01/30/2022 0150 by Mallie Darting, RN Outcome: Not Progressing 01/30/2022 0150 by Mallie Darting, RN Outcome: Not Progressing   Problem: Nutrition: Goal: Adequate nutrition will be maintained 01/30/2022 0150 by Mallie Darting, RN Outcome: Not  Progressing 01/30/2022 0150 by Mallie Darting, RN Outcome: Not Progressing   Problem: Coping: Goal: Level of anxiety will decrease 01/30/2022 0150 by Mallie Darting, RN Outcome: Not Progressing 01/30/2022 0150 by Mallie Darting, RN Outcome: Not Progressing   Problem: Elimination: Goal: Will not experience complications related to bowel motility 01/30/2022 0150 by Mallie Darting, RN Outcome: Not Progressing 01/30/2022 0150 by Mallie Darting, RN Outcome: Not Progressing Goal: Will not experience complications related to urinary retention 01/30/2022 0150 by Mallie Darting, RN Outcome: Not Progressing 01/30/2022 0150 by Mallie Darting, RN Outcome: Not Progressing   Problem: Pain Managment: Goal: General experience of comfort will improve 01/30/2022 0150 by Mallie Darting, RN Outcome: Not Progressing 01/30/2022 0150 by Mallie Darting, RN Outcome: Not Progressing   Problem: Safety: Goal: Ability to remain free from injury will improve 01/30/2022 0150 by Mallie Darting, RN Outcome: Not Progressing 01/30/2022 0150 by Mallie Darting, RN Outcome: Not Progressing   Problem: Skin Integrity: Goal: Risk for impaired skin integrity will decrease 01/30/2022 0150 by Mallie Darting, RN Outcome: Not Progressing 01/30/2022 0150 by Mallie Darting, RN Outcome: Not Progressing   Problem: Education: Goal: Knowledge of [place] Education information/materials will improve Outcome: Not Progressing Goal: Emotional status will improve Outcome: Not Progressing Goal: Mental status will improve Outcome: Not Progressing Goal: Verbalization of understanding the information provided will improve Outcome: Not Progressing   Problem: Safety: Goal: Periods of time without injury will increase Outcome: Not Progressing   Problem: Education: Goal: Utilization of techniques to improve thought processes will improve Outcome: Not Progressing Goal:  Knowledge of the prescribed therapeutic regimen will improve Outcome: Not Progressing   Problem: Safety: Goal: Ability to disclose and discuss suicidal ideas will improve Outcome: Not Progressing Goal: Ability to identify and utilize support systems that promote safety will improve Outcome: Not  Progressing   Problem: Health Behavior/Discharge Planning: Goal: Ability to manage health-related needs will improve 01/30/2022 0150 by Mallie Darting, RN Outcome: Not Progressing 01/30/2022 0150 by Mallie Darting, RN Outcome: Not Progressing   Problem: Clinical Measurements: Goal: Ability to maintain clinical measurements within normal limits will improve 01/30/2022 0150 by Mallie Darting, RN Outcome: Not Progressing 01/30/2022 0150 by Mallie Darting, RN Outcome: Not Progressing   Problem: Clinical Measurements: Goal: Will remain free from infection 01/30/2022 0150 by Mallie Darting, RN Outcome: Not Progressing 01/30/2022 0150 by Mallie Darting, RN Outcome: Not Progressing

## 2022-01-31 ENCOUNTER — Other Ambulatory Visit: Payer: Self-pay

## 2022-01-31 DIAGNOSIS — F4321 Adjustment disorder with depressed mood: Secondary | ICD-10-CM | POA: Diagnosis not present

## 2022-01-31 LAB — GLUCOSE, CAPILLARY
Glucose-Capillary: 192 mg/dL — ABNORMAL HIGH (ref 70–99)
Glucose-Capillary: 189 mg/dL — ABNORMAL HIGH (ref 70–99)
Glucose-Capillary: 142 mg/dL — ABNORMAL HIGH (ref 70–99)
Glucose-Capillary: 164 mg/dL — ABNORMAL HIGH (ref 70–99)

## 2022-01-31 MED ORDER — METOPROLOL TARTRATE 50 MG PO TABS: 50.0000 mg | ORAL_TABLET | Freq: Every day | ORAL | 1 refills | Status: DC

## 2022-01-31 MED ORDER — ATORVASTATIN CALCIUM 40 MG PO TABS: 40.0000 mg | ORAL_TABLET | Freq: Every day | ORAL | 1 refills | Status: DC

## 2022-01-31 MED ORDER — GABAPENTIN 300 MG PO CAPS: 300.0000 mg | ORAL_CAPSULE | Freq: Every day | ORAL | 1 refills | Status: DC

## 2022-01-31 MED ORDER — HYDRALAZINE HCL 25 MG PO TABS
100.0000 mg | ORAL_TABLET | Freq: Two times a day (BID) | ORAL | Status: DC
  Administered 2022-01-31 – 2022-02-06 (×12): 100 mg via ORAL
  Filled 2022-01-31: qty 4
  Filled 2022-01-31: qty 2
  Filled 2022-01-31: qty 4
  Filled 2022-01-31: qty 2
  Filled 2022-01-31: qty 4
  Filled 2022-01-31 (×3): qty 2
  Filled 2022-01-31: qty 4
  Filled 2022-01-31: qty 2
  Filled 2022-01-31 (×4): qty 4

## 2022-01-31 MED ORDER — DAPAGLIFLOZIN PROPANEDIOL 10 MG PO TABS
10.0000 mg | ORAL_TABLET | Freq: Every day | ORAL | 1 refills | Status: DC
Start: 2022-02-01 — End: 2022-02-06
  Filled 2022-01-31: qty 30, 30d supply, fill #0

## 2022-01-31 MED ORDER — HYDROXYZINE HCL 25 MG PO TABS
25.0000 mg | ORAL_TABLET | Freq: Three times a day (TID) | ORAL | 1 refills | Status: DC | PRN
Start: 2022-01-31 — End: 2022-02-06

## 2022-01-31 MED ORDER — LEVETIRACETAM 500 MG PO TABS: 500.0000 mg | ORAL_TABLET | Freq: Two times a day (BID) | ORAL | 1 refills | Status: DC

## 2022-01-31 MED ORDER — INSULIN PEN NEEDLE 32G X 4 MM MISC
0 refills | Status: AC
  Filled 2022-01-31: qty 100, 90d supply, fill #0

## 2022-01-31 MED ORDER — LOSARTAN POTASSIUM 100 MG PO TABS: 100.0000 mg | ORAL_TABLET | Freq: Every day | ORAL | 1 refills | Status: DC

## 2022-01-31 MED ORDER — HYDRALAZINE HCL 100 MG PO TABS: 100.0000 mg | ORAL_TABLET | Freq: Two times a day (BID) | ORAL | 1 refills | Status: DC

## 2022-01-31 MED ORDER — BUPROPION HCL ER (XL) 300 MG PO TB24
300.0000 mg | ORAL_TABLET | Freq: Every day | ORAL | 1 refills | Status: DC
Start: 2022-02-01 — End: 2022-02-06

## 2022-01-31 MED ORDER — METFORMIN HCL 1000 MG PO TABS
1000.0000 mg | ORAL_TABLET | Freq: Every day | ORAL | 1 refills | Status: DC
Start: 2022-02-01 — End: 2022-02-06

## 2022-01-31 MED ORDER — BASAGLAR KWIKPEN 100 UNIT/ML ~~LOC~~ SOPN
30.0000 [IU] | PEN_INJECTOR | Freq: Every day | SUBCUTANEOUS | 11 refills | Status: DC
  Filled 2022-01-31: qty 15, 50d supply, fill #0

## 2022-01-31 MED ORDER — SERTRALINE HCL 100 MG PO TABS: 100.0000 mg | ORAL_TABLET | Freq: Every day | ORAL | 1 refills | Status: DC

## 2022-01-31 NOTE — BHH Counselor (Signed)
CSW attempted to meet with the patient several times as discussed during treatment.  Patient was asleep each time CSW came by.  CSW did not awaken.  Patient appeared to be at rest and no disturbance was noted.  CSW has printed resources for the patient and placed in patient's chart.  Assunta Curtis, MSW, LCSW 01/31/2022 3:47 PM

## 2022-01-31 NOTE — BH IP Treatment Plan (Signed)
Interdisciplinary Treatment and Diagnostic Plan Update  01/31/2022 Time of Session: 9:30AM SHADELL BRENN MRN: [id number]  Principal Diagnosis: Adjustment disorder with depressed mood  Secondary Diagnoses: Principal Problem:   Adjustment disorder with depressed mood Active Problems:   Primary hypertension   Diabetes mellitus without complication ([place])   Severe recurrent major depression without psychotic features ([place])   Intentional overdose of gabapentin ([place])   Current Medications:  Current Facility-Administered Medications  Medication Dose Route Frequency Provider Last Rate Last Admin   acetaminophen (TYLENOL) tablet 650 mg  650 mg Oral Q6H PRN Caroline Sauger, NP   650 mg at 01/30/22 0825   alum & mag hydroxide-simeth (MAALOX/MYLANTA) 200-200-20 MG/5ML suspension 30 mL  30 mL Oral Q4H PRN Caroline Sauger, NP       atorvastatin (LIPITOR) tablet 40 mg  40 mg Oral Daily Clapacs, John T, MD   40 mg at 01/30/22 2132   buPROPion (WELLBUTRIN XL) 24 hr tablet 300 mg  300 mg Oral Daily Clapacs, Madie Reno, MD   300 mg at 01/31/22 0825   dapagliflozin propanediol (FARXIGA) tablet 10 mg  10 mg Oral Daily Clapacs, Madie Reno, MD   10 mg at 01/31/22 6203   gabapentin (NEURONTIN) capsule 300 mg  300 mg Oral QHS Clapacs, John T, MD   300 mg at 01/30/22 2132   hydrALAZINE (APRESOLINE) tablet 100 mg  100 mg Oral BID Clapacs, Madie Reno, MD       hydrOXYzine (ATARAX) tablet 25 mg  25 mg Oral TID PRN Caroline Sauger, NP   25 mg at 01/30/22 2133   insulin glargine-yfgn (SEMGLEE) injection 30 Units  30 Units Subcutaneous QHS Caroline Sauger, NP   30 Units at 01/30/22 2132   levETIRAcetam (KEPPRA) tablet 500 mg  500 mg Oral BID Clapacs, John T, MD   500 mg at 01/31/22 0825   losartan (COZAAR) tablet 100 mg  100 mg Oral Daily Caroline Sauger, NP   100 mg at 01/31/22 0825   magnesium hydroxide (MILK OF MAGNESIA) suspension 30 mL  30 mL Oral Daily PRN Caroline Sauger, NP        metFORMIN (GLUCOPHAGE) tablet 1,000 mg  1,000 mg Oral Q breakfast Clapacs, John T, MD   1,000 mg at 01/31/22 0825   metoprolol tartrate (LOPRESSOR) tablet 50 mg  50 mg Oral Daily Clapacs, Madie Reno, MD   50 mg at 01/31/22 0825   sertraline (ZOLOFT) tablet 100 mg  100 mg Oral Daily Clapacs, Madie Reno, MD   100 mg at 01/31/22 0825   PTA Medications: Medications Prior to Admission  Medication Sig Dispense Refill Last Dose   blood glucose meter kit and supplies KIT USE AS DIRECTED TO CHECK BLOOD SUGAR UP TO 4 TIMES DAILY. 1 each 0    buPROPion (WELLBUTRIN XL) 150 MG 24 hr tablet Take 1 tablet (150 mg total) by mouth daily. 30 tablet 1    dapagliflozin propanediol (FARXIGA) 10 MG TABS tablet Take 1 tablet (10 mg total) by mouth every morning. 30 tablet 1    glucose blood (RIGHTEST GS550 BLOOD GLUCOSE) test strip USE AS DIRECTED TO CHECK BLOOD SUGAR UP TO 4 TIMES DAILY. 100 strip 0    hydrALAZINE (APRESOLINE) 50 MG tablet Take 1 tablet (50 mg total) by mouth 2 (two) times daily. 60 tablet 1    insulin glargine-yfgn (SEMGLEE) 100 UNIT/ML injection Inject 0.3 mLs (30 Units total) into the skin at bedtime. 10 mL 1    Insulin Pen Needle 32G X  4 MM MISC Use as directed with insulin pen 100 each 0    metFORMIN (FORTAMET) 1000 MG (OSM) 24 hr tablet Take 1 tablet (1,000 mg total) by mouth daily with breakfast. 30 tablet 1    metoprolol tartrate (LOPRESSOR) 50 MG tablet Take 1 tablet (50 mg total) by mouth daily. 30 tablet 1    Rightest GL300 Lancets MISC USE AS DIRECTED TO CHECK BLOOD SUGAR UP TO 4 TIMES DAILY. 100 each 0    [DISCONTINUED] atorvastatin (LIPITOR) 40 MG tablet Take 1 tablet (40 mg total) by mouth once daily. 30 tablet 1    [DISCONTINUED] gabapentin (NEURONTIN) 300 MG capsule Take 1 capsule (300 mg total) by mouth once nightly at bedtime. 30 capsule 1    [DISCONTINUED] hydrOXYzine (ATARAX) 25 MG tablet Take 1 tablet (25 mg total) by mouth 3 (three) times daily as needed for anxiety (or sleep). 30 tablet  1    [DISCONTINUED] levETIRAcetam (KEPPRA) 500 MG tablet Take 1 tablet (500 mg total) by mouth 2 (two) times daily. 60 tablet 1    [DISCONTINUED] losartan (COZAAR) 100 MG tablet Take 1 tablet (100 mg total) by mouth once daily. 30 tablet 1    [DISCONTINUED] sertraline (ZOLOFT) 100 MG tablet Take 1 tablet (100 mg total) by mouth once daily. 30 tablet 1     Patient Stressors: Financial difficulties   Medication change or noncompliance    Patient Strengths: Capable of independent living  Motivation for treatment/growth   Treatment Modalities: Medication Management, Group therapy, Case management,  1 to 1 session with clinician, Psychoeducation, Recreational therapy.   Physician Treatment Plan for Primary Diagnosis: Adjustment disorder with depressed mood Long Term Goal(s): Improvement in symptoms so as ready for discharge   Short Term Goals: Compliance with prescribed medications will improve Ability to disclose and discuss suicidal ideas Ability to demonstrate self-control will improve Ability to identify and develop effective coping behaviors will improve  Medication Management: Evaluate patient's response, side effects, and tolerance of medication regimen.  Therapeutic Interventions: 1 to 1 sessions, Unit Group sessions and Medication administration.  Evaluation of Outcomes: Progressing  Physician Treatment Plan for Secondary Diagnosis: Principal Problem:   Adjustment disorder with depressed mood Active Problems:   Primary hypertension   Diabetes mellitus without complication (HCC)   Severe recurrent major depression without psychotic features ([place])   Intentional overdose of gabapentin ([place])  Long Term Goal(s): Improvement in symptoms so as ready for discharge   Short Term Goals: Compliance with prescribed medications will improve Ability to disclose and discuss suicidal ideas Ability to demonstrate self-control will improve Ability to identify and develop effective coping  behaviors will improve     Medication Management: Evaluate patient's response, side effects, and tolerance of medication regimen.  Therapeutic Interventions: 1 to 1 sessions, Unit Group sessions and Medication administration.  Evaluation of Outcomes: Progressing   RN Treatment Plan for Primary Diagnosis: Adjustment disorder with depressed mood Long Term Goal(s): Knowledge of disease and therapeutic regimen to maintain health will improve  Short Term Goals: Ability to demonstrate self-control, Ability to participate in decision making will improve, Ability to verbalize feelings will improve, Ability to disclose and discuss suicidal ideas, Ability to identify and develop effective coping behaviors will improve, and Compliance with prescribed medications will improve  Medication Management: RN will administer medications as ordered by provider, will assess and evaluate patient's response and provide education to patient for prescribed medication. RN will report any adverse and/or side effects to prescribing provider.  Therapeutic Interventions:  1 on 1 counseling sessions, Psychoeducation, Medication administration, Evaluate responses to treatment, Monitor vital signs and CBGs as ordered, Perform/monitor CIWA, COWS, AIMS and Fall Risk screenings as ordered, Perform wound care treatments as ordered.  Evaluation of Outcomes: Progressing   LCSW Treatment Plan for Primary Diagnosis: Adjustment disorder with depressed mood Long Term Goal(s): Safe transition to appropriate next level of care at discharge, Engage patient in therapeutic group addressing interpersonal concerns.  Short Term Goals: Engage patient in aftercare planning with referrals and resources, Increase social support, Increase ability to appropriately verbalize feelings, Increase emotional regulation, Facilitate acceptance of mental health diagnosis and concerns, and Increase skills for wellness and recovery  Therapeutic  Interventions: Assess for all discharge needs, 1 to 1 time with Social worker, Explore available resources and support systems, Assess for adequacy in community support network, Educate family and significant other(s) on suicide prevention, Complete Psychosocial Assessment, Interpersonal group therapy.  Evaluation of Outcomes: Progressing   Progress in Treatment: Attending groups: No. Participating in groups: No. Taking medication as prescribed: Yes. Toleration medication: Yes. Family/Significant other contact made: No, will contact:  once permission is given Patient understands diagnosis: Yes. Discussing patient identified problems/goals with staff: Yes. Medical problems stabilized or resolved: Yes. Denies suicidal/homicidal ideation: Yes. Issues/concerns per patient self-inventory: No. Other: none  New problem(s) identified: No, Describe:  none  New Short Term/Long Term Goal(s): elimination of symptoms of psychosis, medication management for mood stabilization; elimination of SI thoughts; development of comprehensive mental wellness/sobriety plan.   Patient Goals:  "be able to function, not let everything be a stressor"  Discharge Plan or Barriers: Patient reports plans to move to the home of her aunt in [place].  She reports plans to follow up with the [facility] in [facility] and appointments have been scheduled. Patient asked for food and housing assistance in both areas and CSW will assist with providing resources.  Reason for Continuation of Hospitalization: Anxiety Depression Medication stabilization Suicidal ideation  Estimated Length of Stay:  TBD  Last 3 [place] Suicide Severity Risk Score: [place] Admission (Current) from 01/30/2022 in [facility] ED from 01/29/2022 in [facility] Admission (Discharged) from 12/24/2021 in [facility] High Risk Error: Q6  is Yes, you must answer 7 High Risk       Last PHQ 2/9 Scores:    10/24/2021    6:10 PM 10/16/2021    5:08 PM 10/03/2021    9:07 AM  Depression screen PHQ 2/9  Decreased Interest 3 1 1   Down, Depressed, Hopeless 3 1 1   PHQ - 2 Score 6 2 2   Altered sleeping 3 3 3   Tired, decreased energy 2 2 3   Change in appetite 2 0 0  Feeling bad or failure about yourself  2 0 1  Trouble concentrating 2 0 0  Moving slowly or fidgety/restless 0 0 0  Suicidal thoughts 1 0 0  PHQ-9 Score 18 7 9   Difficult doing work/chores Very difficult  Very difficult    Scribe for Treatment Team: Rozann Lesches, LCSW 01/31/2022 1:43 PM

## 2022-01-31 NOTE — Group Note (Signed)
LCSW Group Therapy Note  Group Date: 01/31/2022 Start Time: 1430 End Time: 1530   Type of Therapy and Topic:  Group Therapy: Using "I" Statements  Participation Level:  Did Not Attend  Description of Group:  Patients were asked to provide details of some interpersonal conflicts they have experienced. Patients were then educated about "I" statements, communication which focuses on feelings or views of the speaker rather than what the other person is doing. T group members were asked to reflect on past conflicts and to provide specific examples for utilizing "I" statements.  Therapeutic Goals:  Patients will verbalize understanding of ineffective communication and effective communication. Patients will be able to empathize with whom they are having conflict. Patients will practice effective communication in the form of "I" statements.    Summary of Patient Progress: Due to the acuity and complex discharge plans, group was not held. Patient was provided therapeutic worksheets and asked to meet with CSW as needed.  Therapeutic Modalities:   Cognitive Behavioral Therapy Solution-Focused Therapy    Rozann Lesches, LCSW 01/31/2022  3:45 PM

## 2022-01-31 NOTE — Progress Notes (Signed)
[facility] MD Progress Note  01/31/2022 10:44 AM Kerry Webb  MRN:  [id number] Subjective: Follow-up 56 year old woman with depression and recent suicide attempt.  Patient attended treatment team today.  Patient does not feel completely safe with discharge continuing to have passive suicidal thoughts and depressed mood.  Feels that she would prefer to be in the hospital at least another day for evaluation.  No need to change medicine.  She is neatly dressed and groomed and participating appropriately in groups and activities Principal Problem: Adjustment disorder with depressed mood Diagnosis: Principal Problem:   Adjustment disorder with depressed mood Active Problems:   Primary hypertension   Diabetes mellitus without complication ([place])   Severe recurrent major depression without psychotic features ([place])   Intentional overdose of gabapentin ([place])  Total Time spent with patient: 30 minutes  Past Psychiatric History: Past history of recent severe depression in the context of major life changes  Past Medical History:  Past Medical History:  Diagnosis Date   Diabetes mellitus without complication ([place])    GERD (gastroesophageal reflux disease)    Heart murmur 1967   Hypertension 1993   Lump or mass in breast    Left   Seizures ([place]) 1983   Ulcer 1985    Past Surgical History:  Procedure Laterality Date   ABDOMINAL HYSTERECTOMY  2004   APPENDECTOMY  2014   BREAST BIOPSY Right 01-23-14   benign   BREAST SURGERY Left 01/14/12   BREAST SURGERY Right 05-16-14   benign   CHOLECYSTECTOMY N/A 05/18/2019   Procedure: LAPAROSCOPIC CHOLECYSTECTOMY;  Surgeon: Benjamine Sprague, DO;  Location: [facility] ORS;  Service: General;  Laterality: N/A;   COLONOSCOPY  2013   Dr. Candace Cruise   Family History:  Family History  Problem Relation Age of Onset   Liver cancer Mother    Cancer Mother        liver   Thyroid disease Mother    Kidney disease Father    Alcohol abuse Father    Other Brother        auto  accident   Diabetes Maternal Aunt    Breast cancer Maternal Aunt    Cancer Maternal Grandmother        breast   Hypertension Paternal Grandmother    Kidney disease Paternal Grandfather    Hypertension Paternal Grandfather    Family Psychiatric  History: See previous Social History:  Social History   Substance and Sexual Activity  Alcohol Use Not Currently   Alcohol/week: 1.0 standard drink of alcohol   Types: 1 Glasses of wine per week   Comment: last use 03/2021     Social History   Substance and Sexual Activity  Drug Use Never    Social History   Socioeconomic History   Marital status: Married    Spouse name: Not on file   Number of children: Not on file   Years of education: Not on file   Highest education level: Not on file  Occupational History   Not on file  Tobacco Use   Smoking status: Never   Smokeless tobacco: Never  Vaping Use   Vaping Use: Never used  Substance and Sexual Activity   Alcohol use: Not Currently    Alcohol/week: 1.0 standard drink of alcohol    Types: 1 Glasses of wine per week    Comment: last use 03/2021   Drug use: Never   Sexual activity: Not on file  Other Topics Concern   Not on file  Social History  Narrative   Not on file   Social Determinants of Health   Financial Resource Strain: Not on file  Food Insecurity: Food Insecurity Present (06/13/2021)   Hunger Vital Sign    Worried About Running Out of Food in the Last Year: Sometimes true    Ran Out of Food in the Last Year: Sometimes true  Transportation Needs: No Transportation Needs (06/13/2021)   PRAPARE - Hydrologist (Medical): No    Lack of Transportation (Non-Medical): No  Physical Activity: Not on file  Stress: Not on file  Social Connections: Not on file   Additional Social History:                         Sleep: Fair  Appetite:  Fair  Current Medications: Current Facility-Administered Medications  Medication Dose Route  Frequency Provider Last Rate Last Admin   acetaminophen (TYLENOL) tablet 650 mg  650 mg Oral Q6H PRN Caroline Sauger, NP   650 mg at 01/30/22 0825   alum & mag hydroxide-simeth (MAALOX/MYLANTA) 200-200-20 MG/5ML suspension 30 mL  30 mL Oral Q4H PRN Caroline Sauger, NP       atorvastatin (LIPITOR) tablet 40 mg  40 mg Oral Daily Tonantzin Mimnaugh T, MD   40 mg at 01/30/22 2132   buPROPion (WELLBUTRIN XL) 24 hr tablet 300 mg  300 mg Oral Daily Justene Jensen T, MD   300 mg at 01/31/22 0825   dapagliflozin propanediol (FARXIGA) tablet 10 mg  10 mg Oral Daily Pharell Rolfson, Madie Reno, MD   10 mg at 01/31/22 0093   gabapentin (NEURONTIN) capsule 300 mg  300 mg Oral QHS Latonda Larrivee T, MD   300 mg at 01/30/22 2132   hydrALAZINE (APRESOLINE) tablet 50 mg  50 mg Oral BID Caroline Sauger, NP   50 mg at 01/31/22 0825   hydrOXYzine (ATARAX) tablet 25 mg  25 mg Oral TID PRN Caroline Sauger, NP   25 mg at 01/30/22 2133   insulin glargine-yfgn (SEMGLEE) injection 30 Units  30 Units Subcutaneous QHS Caroline Sauger, NP   30 Units at 01/30/22 2132   levETIRAcetam (KEPPRA) tablet 500 mg  500 mg Oral BID Claborn Janusz T, MD   500 mg at 01/31/22 0825   losartan (COZAAR) tablet 100 mg  100 mg Oral Daily Caroline Sauger, NP   100 mg at 01/31/22 0825   magnesium hydroxide (MILK OF MAGNESIA) suspension 30 mL  30 mL Oral Daily PRN Caroline Sauger, NP       metFORMIN (GLUCOPHAGE) tablet 1,000 mg  1,000 mg Oral Q breakfast Ardene Remley T, MD   1,000 mg at 01/31/22 0825   metoprolol tartrate (LOPRESSOR) tablet 50 mg  50 mg Oral Daily Desirey Keahey, Madie Reno, MD   50 mg at 01/31/22 0825   sertraline (ZOLOFT) tablet 100 mg  100 mg Oral Daily Roger Fasnacht, Madie Reno, MD   100 mg at 01/31/22 0825    Lab Results:  Results for orders placed or performed during the hospital encounter of 01/30/22 (from the past 48 hour(s))  Glucose, capillary     Status: Abnormal   Collection Time: 01/30/22 12:59 AM  Result Value Ref Range    Glucose-Capillary 276 (H) 70 - 99 mg/dL    Comment: Glucose reference range applies only to samples taken after fasting for at least 8 hours.  Glucose, capillary     Status: Abnormal   Collection Time: 01/30/22  6:21 AM  Result  Value Ref Range   Glucose-Capillary 171 (H) 70 - 99 mg/dL    Comment: Glucose reference range applies only to samples taken after fasting for at least 8 hours.  Glucose, capillary     Status: Abnormal   Collection Time: 01/30/22 12:39 PM  Result Value Ref Range   Glucose-Capillary 372 (H) 70 - 99 mg/dL    Comment: Glucose reference range applies only to samples taken after fasting for at least 8 hours.  Glucose, capillary     Status: Abnormal   Collection Time: 01/30/22  4:20 PM  Result Value Ref Range   Glucose-Capillary 204 (H) 70 - 99 mg/dL    Comment: Glucose reference range applies only to samples taken after fasting for at least 8 hours.  Glucose, capillary     Status: Abnormal   Collection Time: 01/30/22  9:34 PM  Result Value Ref Range   Glucose-Capillary 319 (H) 70 - 99 mg/dL    Comment: Glucose reference range applies only to samples taken after fasting for at least 8 hours.  Glucose, capillary     Status: Abnormal   Collection Time: 01/31/22  6:38 AM  Result Value Ref Range   Glucose-Capillary 192 (H) 70 - 99 mg/dL    Comment: Glucose reference range applies only to samples taken after fasting for at least 8 hours.    Blood Alcohol level:  Lab Results  Component Value Date   ETH <10 89/37/3428    Metabolic Disorder Labs: Lab Results  Component Value Date   HGBA1C 8.9 (A) 09/18/2021   MPG 214.47 04/29/2021   MPG 171.42 05/18/2019   No results found for: "PROLACTIN" Lab Results  Component Value Date   CHOL 256 (H) 06/19/2021   TRIG 325 (H) 06/19/2021   HDL 34 (L) 06/19/2021   CHOLHDL 7.5 (H) 06/19/2021   LDLCALC 160 (H) 06/19/2021    Physical Findings: AIMS:  , ,  ,  ,    CIWA:    COWS:     Musculoskeletal: Strength &  Muscle Tone: within normal limits Gait & Station: normal Patient leans: N/A  Psychiatric Specialty Exam:  Presentation  General Appearance: Appropriate for Environment  Eye Contact:Good  Speech:Clear and Coherent  Speech Volume:Normal  Handedness:Right   Mood and Affect  Mood:Depressed; Hopeless  Affect:Congruent   Thought Process  Thought Processes:Coherent  Descriptions of Associations:Intact  Orientation:Full (Time, Place and Person)  Thought Content:Logical  History of Schizophrenia/Schizoaffective disorder:No  Duration of Psychotic Symptoms:No data recorded Hallucinations:No data recorded Ideas of Reference:None  Suicidal Thoughts:No data recorded Homicidal Thoughts:No data recorded  Sensorium  Memory:Immediate Good; Remote Good; Recent Good  Judgment:Good  Insight:Good   Executive Functions  Concentration:Good  Attention Span:Good  [place] of Knowledge:Good  Language:Good   Psychomotor Activity  Psychomotor Activity:No data recorded  Assets  Assets:Desire for Improvement; Leisure Time; Physical Health; Resilience; Social Support; Housing; Financial Resources/Insurance   Sleep  Sleep:No data recorded   Physical Exam: Physical Exam Vitals and nursing note reviewed.  Constitutional:      Appearance: Normal appearance.  HENT:     Head: Normocephalic and atraumatic.     Mouth/Throat:     Pharynx: Oropharynx is clear.  Eyes:     Pupils: Pupils are equal, round, and reactive to light.  Cardiovascular:     Rate and Rhythm: Normal rate and regular rhythm.  Pulmonary:     Effort: Pulmonary effort is normal.     Breath sounds: Normal breath sounds.  Abdominal:  General: Abdomen is flat.     Palpations: Abdomen is soft.  Musculoskeletal:        General: Normal range of motion.  Skin:    General: Skin is warm and dry.  Neurological:     General: No focal deficit present.     Mental Status: She is alert. Mental  status is at baseline.  Psychiatric:        Attention and Perception: Attention normal.        Mood and Affect: Mood is anxious and depressed.        Speech: Speech is delayed.        Behavior: Behavior is cooperative.        Thought Content: Thought content normal.        Cognition and Memory: Cognition normal.        Judgment: Judgment normal.    Review of Systems  Constitutional: Negative.   HENT: Negative.    Eyes: Negative.   Respiratory: Negative.    Cardiovascular: Negative.   Gastrointestinal: Negative.   Musculoskeletal: Negative.   Skin: Negative.   Neurological: Negative.   Psychiatric/Behavioral:  Positive for depression. The patient is nervous/anxious.    Blood pressure (!) 139/114, pulse 79, temperature 98 F (36.7 C), temperature source Oral, resp. rate 18, height '5\' 5"'$  (1.651 m), weight 85.7 kg, SpO2 100 %. Body mass index is 31.44 kg/m.   Treatment Plan Summary: Medication management and Plan continue to medicine for depression.  Increase dose of bupropion.  Engage in individual and group therapy.  Patient will talk with the provider over the weekend about whether she feels safe and at that point if they feel comfortable with that she could potentially be discharged  Alethia Berthold, MD 01/31/2022, 10:44 AM

## 2022-01-31 NOTE — Inpatient Diabetes Management (Addendum)
Inpatient Diabetes Program Recommendations  AACE/ADA: New Consensus Statement on Inpatient Glycemic Control (2015)  Target Ranges:  Prepandial:   less than 140 mg/dL      Peak postprandial:   less than 180 mg/dL (1-2 hours)      Critically ill patients:  140 - 180 mg/dL   Lab Results  Component Value Date   GLUCAP 192 (H) 01/31/2022   HGBA1C 8.9 (A) 09/18/2021    Review of Glycemic Control  Latest Reference Range & Units 01/30/22 06:21 01/30/22 12:39 01/30/22 16:20 01/30/22 21:34 01/31/22 06:38  Glucose-Capillary 70 - 99 mg/dL 171 (H) 372 (H) 204 (H) 319 (H) 192 (H)  (H): Data is abnormally high Diabetes history: DM2 Outpatient Diabetes medications: Farxiga 10 mg daily, Semglee 30 units QHS Current orders for Inpatient glycemic control: Semglee 30 units QHS, Farxiga 10 mg QD, Metformin 1000 mg QAM   Inpatient Diabetes Program Recommendations:     Please consider:  Novolog 0-9 units TID and 0-5 QHS Novolog 3 units TID with meals  as postprandials are elevated  Will continue to follow while inpatient.  Thank you, Reche Dixon, MSN, [place] Diabetes Coordinator Inpatient Diabetes Program [phone] (team pager from 8a-5p)

## 2022-01-31 NOTE — Progress Notes (Signed)
D: Patient alert and oriented. Patient denies pain. Patient denies anxiety and depression. Patient denies SI/HI/AVH. Patient states that she did not sleep very well due to yelling heard throughout the night by other patient.  Patient isolative to room during majority of shift with exception to coming out for meals and medication. Patient did come out to the dayroom and interacted with peers after dinner.  A: Scheduled medications administered to patient, per MD orders.  Support and encouragement provided to patient.  Q15 minute safety checks maintained.   R: Patient compliant with medication administration and treatment plan. No adverse drug reactions noted. Patient remains safe on the unit at this time.

## 2022-01-31 NOTE — Plan of Care (Signed)
  Problem: Education: Goal: Knowledge of General Education information will improve Description: Including pain rating scale, medication(s)/side effects and non-pharmacologic comfort measures Outcome: Progressing   Problem: Clinical Measurements: Goal: Ability to maintain clinical measurements within normal limits will improve Outcome: Progressing   Problem: Education: Goal: Knowledge of [place] General Education information/materials will improve Outcome: Progressing Goal: Verbalization of understanding the information provided will improve Outcome: Progressing   Problem: Education: Goal: Knowledge of the prescribed therapeutic regimen will improve Outcome: Progressing

## 2022-01-31 NOTE — Progress Notes (Signed)
Recreation Therapy Notes    Date: 01/31/2022   Time: 10:50 am     Location: Court yard    Behavioral response: N/A   Intervention Topic: Social Skills     Discussion/Intervention: Patient refused to attend group.    Clinical Observations/Feedback:  Patient refused to attend group.    Georgios Kina LRT/CTRS        Lumi Winslett 01/31/2022 11:42 AM

## 2022-01-31 NOTE — BHH Suicide Risk Assessment (Signed)
[facility] Discharge Suicide Risk Assessment   Principal Problem: Adjustment disorder with depressed mood Discharge Diagnoses: Principal Problem:   Adjustment disorder with depressed mood Active Problems:   Primary hypertension   Diabetes mellitus without complication (HCC)   Severe recurrent major depression without psychotic features ([place])   Intentional overdose of gabapentin ([place])   Total Time spent with patient: 30 minutes  Musculoskeletal: Strength & Muscle Tone: within normal limits Gait & Station: normal Patient leans: N/A  Psychiatric Specialty Exam  Presentation  General Appearance: Appropriate for Environment  Eye Contact:Good  Speech:Clear and Coherent  Speech Volume:Normal  Handedness:Right   Mood and Affect  Mood:Depressed; Hopeless  Duration of Depression Symptoms: Greater than two weeks  Affect:Congruent   Thought Process  Thought Processes:Coherent  Descriptions of Associations:Intact  Orientation:Full (Time, Place and Person)  Thought Content:Logical  History of Schizophrenia/Schizoaffective disorder:No  Duration of Psychotic Symptoms:No data recorded Hallucinations:No data recorded Ideas of Reference:None  Suicidal Thoughts:No data recorded Homicidal Thoughts:No data recorded  Sensorium  Memory:Immediate Good; Remote Good; Recent Good  Judgment:Good  Insight:Good   Executive Functions  Concentration:Good  Attention Span:Good  [place]  Language:Good   Psychomotor Activity  Psychomotor Activity:No data recorded  Assets  Assets:Desire for Improvement; Leisure Time; Physical Health; Resilience; Social Support; Housing; Financial Resources/Insurance   Sleep  Sleep:No data recorded  Physical Exam: Physical Exam Vitals and nursing note reviewed.  Constitutional:      Appearance: Normal appearance.  HENT:     Head: Normocephalic and atraumatic.     Mouth/Throat:     Pharynx: Oropharynx is  clear.  Eyes:     Pupils: Pupils are equal, round, and reactive to light.  Cardiovascular:     Rate and Rhythm: Normal rate and regular rhythm.  Pulmonary:     Effort: Pulmonary effort is normal.     Breath sounds: Normal breath sounds.  Abdominal:     General: Abdomen is flat.     Palpations: Abdomen is soft.  Musculoskeletal:        General: Normal range of motion.  Skin:    General: Skin is warm and dry.  Neurological:     General: No focal deficit present.     Mental Status: She is alert. Mental status is at baseline.  Psychiatric:        Attention and Perception: Attention normal.        Mood and Affect: Mood is depressed.        Speech: Speech normal.        Behavior: Behavior is cooperative.        Thought Content: Thought content normal. Thought content does not include suicidal ideation.        Cognition and Memory: Cognition normal.    Review of Systems  Constitutional: Negative.   HENT: Negative.    Eyes: Negative.   Respiratory: Negative.    Cardiovascular: Negative.   Gastrointestinal: Negative.   Musculoskeletal: Negative.   Skin: Negative.   Neurological: Negative.   Psychiatric/Behavioral:  Positive for depression. Negative for hallucinations and suicidal ideas. The patient is nervous/anxious.    Blood pressure (!) 139/114, pulse 79, temperature 98 F (36.7 C), temperature source Oral, resp. rate 18, height '5\' 5"'$  (1.651 m), weight 85.7 kg, SpO2 100 %. Body mass index is 31.44 kg/m.  Mental Status Per Nursing Assessment::   On Admission:  Suicidal ideation indicated by patient  Demographic Factors:  Divorced or widowed  Loss Factors: Financial problems/change in socioeconomic status  Historical Factors: Impulsivity  Risk Reduction Factors:   Positive social support  Continued Clinical Symptoms:  Depression:   Impulsivity  Cognitive Features That Contribute To Risk:  Thought constriction (tunnel vision)    Suicide Risk:  Minimal: No  identifiable suicidal ideation.  Patients presenting with no risk factors but with morbid ruminations; may be classified as minimal risk based on the severity of the depressive symptoms   Follow-up Information     [facility] Follow up.   Why: Appointment is scheduled for 10:00AM 02/04/2022.  There will be a $25 copay.  Please bring your ID, insurance card and SS card number. Contact information: [address] [place], [place] [address] [phone]                Plan Of Care/Follow-up recommendations:  Denies current suicidal ideation.  Agrees to continue outpatient treatment and medication management.  Prescriptions will be provided at discharge.  Calm behavior with optimistic and future oriented plans.  Alethia Berthold, MD 01/31/2022, 1:00 PM

## 2022-02-01 DIAGNOSIS — F4321 Adjustment disorder with depressed mood: Secondary | ICD-10-CM | POA: Diagnosis not present

## 2022-02-01 LAB — GLUCOSE, CAPILLARY
Glucose-Capillary: 140 mg/dL — ABNORMAL HIGH (ref 70–99)
Glucose-Capillary: 178 mg/dL — ABNORMAL HIGH (ref 70–99)
Glucose-Capillary: 107 mg/dL — ABNORMAL HIGH (ref 70–99)
Glucose-Capillary: 219 mg/dL — ABNORMAL HIGH (ref 70–99)

## 2022-02-01 MED ORDER — LOPERAMIDE HCL 2 MG PO CAPS: 2.0000 mg | ORAL_CAPSULE | Freq: Three times a day (TID) | ORAL | Status: DC | PRN

## 2022-02-01 NOTE — Progress Notes (Signed)
[facility] MD Progress Note  02/01/2022 12:16 PM Kerry Webb  MRN:  [id number] Subjective: Patient has been compliant with her medications.  She tells me that she is being evicted from her living situation and asks to stay until Monday.  Otherwise no issues and no complaints.  Principal Problem: Adjustment disorder with depressed mood Diagnosis: Principal Problem:   Adjustment disorder with depressed mood Active Problems:   Primary hypertension   Diabetes mellitus without complication (HCC)   Severe recurrent major depression without psychotic features ([place])   Intentional overdose of gabapentin ([place])  Total Time spent with patient: 15 minutes  Past Psychiatric History: Past history of recent severe depression in the context of major life changes  Past Medical History:  Past Medical History:  Diagnosis Date   Diabetes mellitus without complication ([place])    GERD (gastroesophageal reflux disease)    Heart murmur 1967   Hypertension 1993   Lump or mass in breast    Left   Seizures ([place]) 1983   Ulcer 1985    Past Surgical History:  Procedure Laterality Date   ABDOMINAL HYSTERECTOMY  2004   APPENDECTOMY  2014   BREAST BIOPSY Right 01-23-14   benign   BREAST SURGERY Left 01/14/12   BREAST SURGERY Right 05-16-14   benign   CHOLECYSTECTOMY N/A 05/18/2019   Procedure: LAPAROSCOPIC CHOLECYSTECTOMY;  Surgeon: Benjamine Sprague, DO;  Location: [facility] ORS;  Service: General;  Laterality: N/A;   COLONOSCOPY  2013   Dr. Candace Cruise   Family History:  Family History  Problem Relation Age of Onset   Liver cancer Mother    Cancer Mother        liver   Thyroid disease Mother    Kidney disease Father    Alcohol abuse Father    Other Brother        auto accident   Diabetes Maternal Aunt    Breast cancer Maternal Aunt    Cancer Maternal Grandmother        breast   Hypertension Paternal Grandmother    Kidney disease Paternal Grandfather    Hypertension Paternal Grandfather     Social History:   Social History   Substance and Sexual Activity  Alcohol Use Not Currently   Alcohol/week: 1.0 standard drink of alcohol   Types: 1 Glasses of wine per week   Comment: last use 03/2021     Social History   Substance and Sexual Activity  Drug Use Never    Social History   Socioeconomic History   Marital status: Married    Spouse name: Not on file   Number of children: Not on file   Years of education: Not on file   Highest education level: Not on file  Occupational History   Not on file  Tobacco Use   Smoking status: Never   Smokeless tobacco: Never  Vaping Use   Vaping Use: Never used  Substance and Sexual Activity   Alcohol use: Not Currently    Alcohol/week: 1.0 standard drink of alcohol    Types: 1 Glasses of wine per week    Comment: last use 03/2021   Drug use: Never   Sexual activity: Not on file  Other Topics Concern   Not on file  Social History Narrative   Not on file   Social Determinants of Health   Financial Resource Strain: Not on file  Food Insecurity: Food Insecurity Present (06/13/2021)   Hunger Vital Sign    Worried About Estate manager/land agent of Food  in the Last Year: Sometimes true    Ran Out of Food in the Last Year: Sometimes true  Transportation Needs: No Transportation Needs (06/13/2021)   PRAPARE - Hydrologist (Medical): No    Lack of Transportation (Non-Medical): No  Physical Activity: Not on file  Stress: Not on file  Social Connections: Not on file   Additional Social History:                         Sleep: Good  Appetite:  Good  Current Medications: Current Facility-Administered Medications  Medication Dose Route Frequency Provider Last Rate Last Admin   acetaminophen (TYLENOL) tablet 650 mg  650 mg Oral Q6H PRN Caroline Sauger, NP   650 mg at 01/30/22 0825   alum & mag hydroxide-simeth (MAALOX/MYLANTA) 200-200-20 MG/5ML suspension 30 mL  30 mL Oral Q4H PRN Caroline Sauger, NP        atorvastatin (LIPITOR) tablet 40 mg  40 mg Oral Daily Clapacs, John T, MD   40 mg at 01/31/22 2127   buPROPion (WELLBUTRIN XL) 24 hr tablet 300 mg  300 mg Oral Daily Clapacs, Madie Reno, MD   300 mg at 02/01/22 4680   dapagliflozin propanediol (FARXIGA) tablet 10 mg  10 mg Oral Daily Clapacs, Madie Reno, MD   10 mg at 02/01/22 3212   gabapentin (NEURONTIN) capsule 300 mg  300 mg Oral QHS Clapacs, John T, MD   300 mg at 01/31/22 2127   hydrALAZINE (APRESOLINE) tablet 100 mg  100 mg Oral BID Clapacs, John T, MD   100 mg at 02/01/22 2482   hydrOXYzine (ATARAX) tablet 25 mg  25 mg Oral TID PRN Caroline Sauger, NP   25 mg at 01/30/22 2133   insulin glargine-yfgn (SEMGLEE) injection 30 Units  30 Units Subcutaneous QHS Caroline Sauger, NP   30 Units at 01/31/22 2127   levETIRAcetam (KEPPRA) tablet 500 mg  500 mg Oral BID Clapacs, John T, MD   500 mg at 02/01/22 5003   losartan (COZAAR) tablet 100 mg  100 mg Oral Daily Caroline Sauger, NP   100 mg at 02/01/22 7048   magnesium hydroxide (MILK OF MAGNESIA) suspension 30 mL  30 mL Oral Daily PRN Caroline Sauger, NP       metFORMIN (GLUCOPHAGE) tablet 1,000 mg  1,000 mg Oral Q breakfast Clapacs, John T, MD   1,000 mg at 02/01/22 8891   metoprolol tartrate (LOPRESSOR) tablet 50 mg  50 mg Oral Daily Clapacs, Madie Reno, MD   50 mg at 02/01/22 6945   sertraline (ZOLOFT) tablet 100 mg  100 mg Oral Daily Clapacs, Madie Reno, MD   100 mg at 02/01/22 0388    Lab Results:  Results for orders placed or performed during the hospital encounter of 01/30/22 (from the past 48 hour(s))  Glucose, capillary     Status: Abnormal   Collection Time: 01/30/22 12:39 PM  Result Value Ref Range   Glucose-Capillary 372 (H) 70 - 99 mg/dL    Comment: Glucose reference range applies only to samples taken after fasting for at least 8 hours.  Glucose, capillary     Status: Abnormal   Collection Time: 01/30/22  4:20 PM  Result Value Ref Range   Glucose-Capillary 204 (H) 70 - 99  mg/dL    Comment: Glucose reference range applies only to samples taken after fasting for at least 8 hours.  Glucose, capillary     Status: Abnormal  Collection Time: 01/30/22  9:34 PM  Result Value Ref Range   Glucose-Capillary 319 (H) 70 - 99 mg/dL    Comment: Glucose reference range applies only to samples taken after fasting for at least 8 hours.  Glucose, capillary     Status: Abnormal   Collection Time: 01/31/22  6:38 AM  Result Value Ref Range   Glucose-Capillary 192 (H) 70 - 99 mg/dL    Comment: Glucose reference range applies only to samples taken after fasting for at least 8 hours.  Glucose, capillary     Status: Abnormal   Collection Time: 01/31/22 11:17 AM  Result Value Ref Range   Glucose-Capillary 189 (H) 70 - 99 mg/dL    Comment: Glucose reference range applies only to samples taken after fasting for at least 8 hours.  Glucose, capillary     Status: Abnormal   Collection Time: 01/31/22  4:21 PM  Result Value Ref Range   Glucose-Capillary 142 (H) 70 - 99 mg/dL    Comment: Glucose reference range applies only to samples taken after fasting for at least 8 hours.   Comment 1 Notify RN   Glucose, capillary     Status: Abnormal   Collection Time: 01/31/22  9:23 PM  Result Value Ref Range   Glucose-Capillary 164 (H) 70 - 99 mg/dL    Comment: Glucose reference range applies only to samples taken after fasting for at least 8 hours.  Glucose, capillary     Status: Abnormal   Collection Time: 02/01/22  6:48 AM  Result Value Ref Range   Glucose-Capillary 140 (H) 70 - 99 mg/dL    Comment: Glucose reference range applies only to samples taken after fasting for at least 8 hours.  Glucose, capillary     Status: Abnormal   Collection Time: 02/01/22 12:03 PM  Result Value Ref Range   Glucose-Capillary 178 (H) 70 - 99 mg/dL    Comment: Glucose reference range applies only to samples taken after fasting for at least 8 hours.    Blood Alcohol level:  Lab Results  Component Value  Date   ETH <10 22/29/7989    Metabolic Disorder Labs: Lab Results  Component Value Date   HGBA1C 8.9 (A) 09/18/2021   MPG 214.47 04/29/2021   MPG 171.42 05/18/2019   No results found for: "PROLACTIN" Lab Results  Component Value Date   CHOL 256 (H) 06/19/2021   TRIG 325 (H) 06/19/2021   HDL 34 (L) 06/19/2021   CHOLHDL 7.5 (H) 06/19/2021   LDLCALC 160 (H) 06/19/2021    Physical Findings: AIMS:  , ,  ,  ,    CIWA:    COWS:     Musculoskeletal: Strength & Muscle Tone: within normal limits Gait & Station: normal Patient leans: N/A  Psychiatric Specialty Exam:  Presentation  General Appearance: Appropriate for Environment  Eye Contact:Good  Speech:Clear and Coherent  Speech Volume:Normal  Handedness:Right   Mood and Affect  Mood:Depressed; Hopeless  Affect:Congruent   Thought Process  Thought Processes:Coherent  Descriptions of Associations:Intact  Orientation:Full (Time, Place and Person)  Thought Content:Logical  History of Schizophrenia/Schizoaffective disorder:No  Duration of Psychotic Symptoms:No data recorded Hallucinations:No data recorded Ideas of Reference:None  Suicidal Thoughts:No data recorded Homicidal Thoughts:No data recorded  [place]ensor[place]  Memory:Immediate Good; Remote Good; Recent Good  Judgment:Good  Insight:Good   Executive Functions  Concentration:Good  Attention Span:Good  [place]  Language:Good   Psychomotor Activity  Psychomotor Activity:No data recorded  Assets  Assets:Desire for Improvement;  Leisure Time; Physical Health; Resilience; Social Support; Housing; Financial Resources/Insurance   Sleep  Sleep:No data recorded   Physical Exam: Physical Exam ROS Blood pressure (!) 171/111, pulse 90, temperature 97.9 F (36.6 C), temperature source Oral, resp. rate 18, height '5\' 5"'$  (1.651 m), weight 85.7 kg, SpO2 100 %. Body mass index is 31.44 kg/m.   Treatment Plan  Summary: Daily contact with patient to assess and evaluate symptoms and progress in treatment, Medication management, and Plan continue current medications.  Parks Ranger, DO 02/01/2022, 12:16 PM

## 2022-02-01 NOTE — Group Note (Signed)
LCSW Group Therapy Note  Group Date: 02/01/2022 Start Time: 1500 End Time: 1510   Type of Therapy and Topic:  Group Therapy - How To Cope with Nervousness about Discharge   Participation Level:  Did Not Attend   Description of Group This process group involved identification of patients' feelings about discharge. Some of them are scheduled to be discharged soon, while others are new admissions, but each of them was asked to share thoughts and feelings surrounding discharge from the hospital. One common theme was that they are excited at the prospect of going home, while another was that many of them are apprehensive about sharing why they were hospitalized. Patients were given the opportunity to discuss these feelings with their peers in preparation for discharge.  Therapeutic Goals  Patient will identify their overall feelings about pending discharge. Patient will think about how they might proactively address issues that they believe will once again arise once they get home (i.e. with parents). Patients will participate in discussion about having hope for change.   Summary of Patient Progress:    X   Therapeutic Modalities Cognitive Behavioral Therapy   Renald Haithcock A [place], LCSWA 02/01/2022  3:38 PM

## 2022-02-01 NOTE — Progress Notes (Signed)
Patient denies pain, she denies depression and anxiety. Patient seemed to sleep well through out the night with no new behavioral issues to report on shift at this time.

## 2022-02-01 NOTE — Progress Notes (Addendum)
Pt cooperative and pleasant. Pt has sad affect, seclusive to room. Currently reading her book.She was looking forward to being discharged. Denies SI/HI, depression 5/10.Compliant with morning medications. Safety rounds maintained.

## 2022-02-01 NOTE — Plan of Care (Signed)
  Problem: Clinical Measurements: Goal: Will remain free from infection Outcome: Progressing Goal: Respiratory complications will improve Outcome: Progressing   Problem: Activity: Goal: Risk for activity intolerance will decrease Outcome: Progressing   Problem: Coping: Goal: Level of anxiety will decrease Outcome: Progressing   

## 2022-02-02 DIAGNOSIS — F4321 Adjustment disorder with depressed mood: Secondary | ICD-10-CM | POA: Diagnosis not present

## 2022-02-02 LAB — GLUCOSE, CAPILLARY: Glucose-Capillary: 168 mg/dL — ABNORMAL HIGH (ref 70–99)

## 2022-02-02 NOTE — Plan of Care (Signed)
Patient pleasant and cooperative on approach. Patient states that she is glad that she could come to the hospital for help. Patient denies SI,HI and AVH. Compliant with medications. Appetite and energy level good. ADLs maintained. Support and encouragement given.

## 2022-02-02 NOTE — Progress Notes (Signed)
[facility] MD Progress Note  02/02/2022 1:57 PM Kerry Webb  MRN:  [id number] Subjective:   Patient has been compliant with her medications.  She tells me that she is being evicted from her living situation and asks to stay until Monday.  Otherwise no issues and no complaints.  Principal Problem: Adjustment disorder with depressed mood Diagnosis: Principal Problem:   Adjustment disorder with depressed mood Active Problems:   Primary hypertension   Diabetes mellitus without complication (HCC)   Severe recurrent major depression without psychotic features ([place])   Intentional overdose of gabapentin ([place])  Total Time spent with patient: 15 minutes  Past Psychiatric History: Past history of recent severe depression in the context of major life changes  Past Medical History:  Past Medical History:  Diagnosis Date   Diabetes mellitus without complication ([place])    GERD (gastroesophageal reflux disease)    Heart murmur 1967   Hypertension 1993   Lump or mass in breast    Left   Seizures ([place]) 1983   Ulcer 1985    Past Surgical History:  Procedure Laterality Date   ABDOMINAL HYSTERECTOMY  2004   APPENDECTOMY  2014   BREAST BIOPSY Right 01-23-14   benign   BREAST SURGERY Left 01/14/12   BREAST SURGERY Right 05-16-14   benign   CHOLECYSTECTOMY N/A 05/18/2019   Procedure: LAPAROSCOPIC CHOLECYSTECTOMY;  Surgeon: Benjamine Sprague, DO;  Location: [facility] ORS;  Service: General;  Laterality: N/A;   COLONOSCOPY  2013   Dr. Candace Cruise   Family History:  Family History  Problem Relation Age of Onset   Liver cancer Mother    Cancer Mother        liver   Thyroid disease Mother    Kidney disease Father    Alcohol abuse Father    Other Brother        auto accident   Diabetes Maternal Aunt    Breast cancer Maternal Aunt    Cancer Maternal Grandmother        breast   Hypertension Paternal Grandmother    Kidney disease Paternal Grandfather    Hypertension Paternal Grandfather     Social History:   Social History   Substance and Sexual Activity  Alcohol Use Not Currently   Alcohol/week: 1.0 standard drink of alcohol   Types: 1 Glasses of wine per week   Comment: last use 03/2021     Social History   Substance and Sexual Activity  Drug Use Never    Social History   Socioeconomic History   Marital status: Married    Spouse name: Not on file   Number of children: Not on file   Years of education: Not on file   Highest education level: Not on file  Occupational History   Not on file  Tobacco Use   Smoking status: Never   Smokeless tobacco: Never  Vaping Use   Vaping Use: Never used  Substance and Sexual Activity   Alcohol use: Not Currently    Alcohol/week: 1.0 standard drink of alcohol    Types: 1 Glasses of wine per week    Comment: last use 03/2021   Drug use: Never   Sexual activity: Not on file  Other Topics Concern   Not on file  Social History Narrative   Not on file   Social Determinants of Health   Financial Resource Strain: Not on file  Food Insecurity: Food Insecurity Present (06/13/2021)   Hunger Vital Sign    Worried About Running Out  of Food in the Last Year: Sometimes true    Ran Out of Food in the Last Year: Sometimes true  Transportation Needs: No Transportation Needs (06/13/2021)   PRAPARE - Hydrologist (Medical): No    Lack of Transportation (Non-Medical): No  Physical Activity: Not on file  Stress: Not on file  Social Connections: Not on file   Additional Social History:                         Sleep: Good  Appetite:  Good  Current Medications: Current Facility-Administered Medications  Medication Dose Route Frequency Provider Last Rate Last Admin   acetaminophen (TYLENOL) tablet 650 mg  650 mg Oral Q6H PRN Caroline Sauger, NP   650 mg at 01/30/22 0825   alum & mag hydroxide-simeth (MAALOX/MYLANTA) 200-200-20 MG/5ML suspension 30 mL  30 mL Oral Q4H PRN Caroline Sauger, NP        atorvastatin (LIPITOR) tablet 40 mg  40 mg Oral Daily Clapacs, John T, MD   40 mg at 02/01/22 2119   buPROPion (WELLBUTRIN XL) 24 hr tablet 300 mg  300 mg Oral Daily Clapacs, Madie Reno, MD   300 mg at 02/02/22 1194   dapagliflozin propanediol (FARXIGA) tablet 10 mg  10 mg Oral Daily Clapacs, Madie Reno, MD   10 mg at 02/02/22 1740   gabapentin (NEURONTIN) capsule 300 mg  300 mg Oral QHS Clapacs, John T, MD   300 mg at 02/01/22 2118   hydrALAZINE (APRESOLINE) tablet 100 mg  100 mg Oral BID Clapacs, John T, MD   100 mg at 02/02/22 0831   hydrOXYzine (ATARAX) tablet 25 mg  25 mg Oral TID PRN Caroline Sauger, NP   25 mg at 01/30/22 2133   insulin glargine-yfgn (SEMGLEE) injection 30 Units  30 Units Subcutaneous QHS Caroline Sauger, NP   30 Units at 02/01/22 2119   levETIRAcetam (KEPPRA) tablet 500 mg  500 mg Oral BID Clapacs, John T, MD   500 mg at 02/02/22 8144   loperamide (IMODIUM) capsule 2 mg  2 mg Oral TID PRN Parks Ranger, DO       losartan (COZAAR) tablet 100 mg  100 mg Oral Daily Caroline Sauger, NP   100 mg at 02/02/22 8185   magnesium hydroxide (MILK OF MAGNESIA) suspension 30 mL  30 mL Oral Daily PRN Caroline Sauger, NP       metFORMIN (GLUCOPHAGE) tablet 1,000 mg  1,000 mg Oral Q breakfast Clapacs, John T, MD   1,000 mg at 02/02/22 0830   metoprolol tartrate (LOPRESSOR) tablet 50 mg  50 mg Oral Daily Clapacs, John T, MD   50 mg at 02/02/22 0830   sertraline (ZOLOFT) tablet 100 mg  100 mg Oral Daily Clapacs, Madie Reno, MD   100 mg at 02/02/22 6314    Lab Results:  Results for orders placed or performed during the hospital encounter of 01/30/22 (from the past 48 hour(s))  Glucose, capillary     Status: Abnormal   Collection Time: 01/31/22  4:21 PM  Result Value Ref Range   Glucose-Capillary 142 (H) 70 - 99 mg/dL    Comment: Glucose reference range applies only to samples taken after fasting for at least 8 hours.   Comment 1 Notify RN   Glucose, capillary      Status: Abnormal   Collection Time: 01/31/22  9:23 PM  Result Value Ref Range   Glucose-Capillary 164 (H) 70 -  99 mg/dL    Comment: Glucose reference range applies only to samples taken after fasting for at least 8 hours.  Glucose, capillary     Status: Abnormal   Collection Time: 02/01/22  6:48 AM  Result Value Ref Range   Glucose-Capillary 140 (H) 70 - 99 mg/dL    Comment: Glucose reference range applies only to samples taken after fasting for at least 8 hours.  Glucose, capillary     Status: Abnormal   Collection Time: 02/01/22 12:03 PM  Result Value Ref Range   Glucose-Capillary 178 (H) 70 - 99 mg/dL    Comment: Glucose reference range applies only to samples taken after fasting for at least 8 hours.  Glucose, capillary     Status: Abnormal   Collection Time: 02/01/22  4:32 PM  Result Value Ref Range   Glucose-Capillary 107 (H) 70 - 99 mg/dL    Comment: Glucose reference range applies only to samples taken after fasting for at least 8 hours.  Glucose, capillary     Status: Abnormal   Collection Time: 02/01/22  9:15 PM  Result Value Ref Range   Glucose-Capillary 219 (H) 70 - 99 mg/dL    Comment: Glucose reference range applies only to samples taken after fasting for at least 8 hours.  Glucose, capillary     Status: Abnormal   Collection Time: 02/02/22  6:42 AM  Result Value Ref Range   Glucose-Capillary 168 (H) 70 - 99 mg/dL    Comment: Glucose reference range applies only to samples taken after fasting for at least 8 hours.    Blood Alcohol level:  Lab Results  Component Value Date   ETH <10 53/29/9242    Metabolic Disorder Labs: Lab Results  Component Value Date   HGBA1C 8.9 (A) 09/18/2021   MPG 214.47 04/29/2021   MPG 171.42 05/18/2019   No results found for: "PROLACTIN" Lab Results  Component Value Date   CHOL 256 (H) 06/19/2021   TRIG 325 (H) 06/19/2021   HDL 34 (L) 06/19/2021   CHOLHDL 7.5 (H) 06/19/2021   LDLCALC 160 (H) 06/19/2021    Physical  Findings: AIMS:  , ,  ,  ,    CIWA:    COWS:     Musculoskeletal: Strength & Muscle Tone: within normal limits Gait & Station: normal Patient leans: N/A  Psychiatric Specialty Exam:  Presentation  General Appearance: Appropriate for Environment  Eye Contact:Good  Speech:Clear and Coherent  Speech Volume:Normal  Handedness:Right   Mood and Affect  Mood:Depressed; Hopeless  Affect:Congruent   Thought Process  Thought Processes:Coherent  Descriptions of Associations:Intact  Orientation:Full (Time, Place and Person)  Thought Content:Logical  History of Schizophrenia/Schizoaffective disorder:No  Duration of Psychotic Symptoms:No data recorded Hallucinations:No data recorded Ideas of Reference:None  Suicidal Thoughts:No data recorded Homicidal Thoughts:No data recorded  Sensorium  Memory:Immediate Good; Remote Good; Recent Good  Judgment:Good  Insight:Good   Executive Functions  Concentration:Good  Attention Span:Good  [place] of Knowledge:Good  Language:Good   Psychomotor Activity  Psychomotor Activity:No data recorded  Assets  Assets:Desire for Improvement; Leisure Time; Physical Health; Resilience; Social Support; Housing; Financial Resources/Insurance   Sleep  Sleep:No data recorded   Physical Exam: Physical Exam ROS Blood pressure (!) 150/106, pulse 90, temperature 98.3 F (36.8 C), temperature source Oral, resp. rate 18, height '5\' 5"'$  (1.651 m), weight 85.7 kg, SpO2 99 %. Body mass index is 31.44 kg/m.   Treatment Plan Summary: Daily contact with patient to assess and evaluate symptoms and progress  in treatment, Medication management, and Plan continue current medications.  Parks Ranger, DO 02/02/2022, 1:57 PM

## 2022-02-03 DIAGNOSIS — F4321 Adjustment disorder with depressed mood: Secondary | ICD-10-CM | POA: Diagnosis not present

## 2022-02-03 LAB — GLUCOSE, CAPILLARY: Glucose-Capillary: 158 mg/dL — ABNORMAL HIGH (ref 70–99)

## 2022-02-03 MED ORDER — HYDROCHLOROTHIAZIDE 12.5 MG PO TABS
12.5000 mg | ORAL_TABLET | Freq: Every day | ORAL | Status: DC
  Administered 2022-02-03 – 2022-02-05 (×3): 12.5 mg via ORAL
  Filled 2022-02-03 (×3): qty 1

## 2022-02-03 MED ORDER — ARIPIPRAZOLE 5 MG PO TABS
5.0000 mg | ORAL_TABLET | Freq: Every day | ORAL | Status: DC
  Administered 2022-02-03 – 2022-02-06 (×4): 5 mg via ORAL
  Filled 2022-02-03 (×4): qty 1

## 2022-02-03 MED ORDER — POTASSIUM CHLORIDE 20 MEQ PO PACK
20.0000 meq | PACK | Freq: Every day | ORAL | Status: DC
  Administered 2022-02-03: 20 meq via ORAL
  Filled 2022-02-03: qty 1

## 2022-02-03 MED ORDER — POTASSIUM CHLORIDE CRYS ER 20 MEQ PO TBCR
20.0000 meq | EXTENDED_RELEASE_TABLET | Freq: Every day | ORAL | Status: DC
  Administered 2022-02-04 – 2022-02-06 (×3): 20 meq via ORAL
  Filled 2022-02-03 (×3): qty 1

## 2022-02-03 NOTE — Progress Notes (Signed)
Patient alert and oriented x 4, affect is blunted thoughts are organized and coherent. Patient denies SI/HI/AVH interacting appropriately with peers and staff. Patient is complaint with medication regimen no distress noted. 15 minutes safety checks maintained will continue to monitor.

## 2022-02-03 NOTE — Progress Notes (Signed)
Blood pressure 171/109. Received new orders from Dr.Clapacs and carried out.

## 2022-02-03 NOTE — Progress Notes (Signed)
[facility] MD Progress Note  02/03/2022 11:28 AM Kerry Webb  MRN:  [id number] Subjective: Follow-up 56 year old woman with severe depression.  Patient says she feels no better.  Continues to feel depressed and hopeless.  Suicidal thoughts at times.  No specific plan but has mentioned to staff that if she were to overdose again she would not want to call someone.  Admits to me that she still feels like there is no future for her. Principal Problem: Adjustment disorder with depressed mood Diagnosis: Principal Problem:   Adjustment disorder with depressed mood Active Problems:   Primary hypertension   Diabetes mellitus without complication (HCC)   Severe recurrent major depression without psychotic features ([place])   Intentional overdose of gabapentin ([place])  Total Time spent with patient: 30 minutes  Past Psychiatric History: Past history of depression with recent suicide attempt  Past Medical History:  Past Medical History:  Diagnosis Date   Diabetes mellitus without complication ([place])    GERD (gastroesophageal reflux disease)    Heart murmur 1967   Hypertension 1993   Lump or mass in breast    Left   Seizures ([place]) 1983   Ulcer 1985    Past Surgical History:  Procedure Laterality Date   ABDOMINAL HYSTERECTOMY  2004   APPENDECTOMY  2014   BREAST BIOPSY Right 01-23-14   benign   BREAST SURGERY Left 01/14/12   BREAST SURGERY Right 05-16-14   benign   CHOLECYSTECTOMY N/A 05/18/2019   Procedure: LAPAROSCOPIC CHOLECYSTECTOMY;  Surgeon: Benjamine Sprague, DO;  Location: [facility] ORS;  Service: General;  Laterality: N/A;   COLONOSCOPY  2013   Dr. Candace Cruise   Family History:  Family History  Problem Relation Age of Onset   Liver cancer Mother    Cancer Mother        liver   Thyroid disease Mother    Kidney disease Father    Alcohol abuse Father    Other Brother        auto accident   Diabetes Maternal Aunt    Breast cancer Maternal Aunt    Cancer Maternal Grandmother        breast    Hypertension Paternal Grandmother    Kidney disease Paternal Grandfather    Hypertension Paternal Grandfather    Family Psychiatric  History: See previous Social History:  Social History   Substance and Sexual Activity  Alcohol Use Not Currently   Alcohol/week: 1.0 standard drink of alcohol   Types: 1 Glasses of wine per week   Comment: last use 03/2021     Social History   Substance and Sexual Activity  Drug Use Never    Social History   Socioeconomic History   Marital status: Married    Spouse name: Not on file   Number of children: Not on file   Years of education: Not on file   Highest education level: Not on file  Occupational History   Not on file  Tobacco Use   Smoking status: Never   Smokeless tobacco: Never  Vaping Use   Vaping Use: Never used  Substance and Sexual Activity   Alcohol use: Not Currently    Alcohol/week: 1.0 standard drink of alcohol    Types: 1 Glasses of wine per week    Comment: last use 03/2021   Drug use: Never   Sexual activity: Not on file  Other Topics Concern   Not on file  Social History Narrative   Not on file   Social Determinants of Health  Financial Resource Strain: Not on file  Food Insecurity: Food Insecurity Present (06/13/2021)   Hunger Vital Sign    Worried About Running Out of Food in the Last Year: Sometimes true    Ran Out of Food in the Last Year: Sometimes true  Transportation Needs: No Transportation Needs (06/13/2021)   PRAPARE - Hydrologist (Medical): No    Lack of Transportation (Non-Medical): No  Physical Activity: Not on file  Stress: Not on file  Social Connections: Not on file   Additional Social History:                         Sleep: Fair  Appetite:  Fair  Current Medications: Current Facility-Administered Medications  Medication Dose Route Frequency Provider Last Rate Last Admin   acetaminophen (TYLENOL) tablet 650 mg  650 mg Oral Q6H PRN Caroline Sauger, NP   650 mg at 01/30/22 0825   alum & mag hydroxide-simeth (MAALOX/MYLANTA) 200-200-20 MG/5ML suspension 30 mL  30 mL Oral Q4H PRN Caroline Sauger, NP       ARIPiprazole (ABILIFY) tablet 5 mg  5 mg Oral Daily Geovanny Sartin T, MD       atorvastatin (LIPITOR) tablet 40 mg  40 mg Oral Daily Zenora Karpel T, MD   40 mg at 02/02/22 2108   buPROPion (WELLBUTRIN XL) 24 hr tablet 300 mg  300 mg Oral Daily Juana Montini, Madie Reno, MD   300 mg at 02/03/22 6301   dapagliflozin propanediol (FARXIGA) tablet 10 mg  10 mg Oral Daily Keyonta Barradas, Madie Reno, MD   10 mg at 02/03/22 0809   gabapentin (NEURONTIN) capsule 300 mg  300 mg Oral QHS Ahmira Boisselle T, MD   300 mg at 02/02/22 2108   hydrALAZINE (APRESOLINE) tablet 100 mg  100 mg Oral BID Monicia Tse T, MD   100 mg at 02/03/22 0900   hydrOXYzine (ATARAX) tablet 25 mg  25 mg Oral TID PRN Caroline Sauger, NP   25 mg at 01/30/22 2133   insulin glargine-yfgn (SEMGLEE) injection 30 Units  30 Units Subcutaneous QHS Caroline Sauger, NP   30 Units at 02/02/22 2109   levETIRAcetam (KEPPRA) tablet 500 mg  500 mg Oral BID Mitsuye Schrodt T, MD   500 mg at 02/03/22 6010   loperamide (IMODIUM) capsule 2 mg  2 mg Oral TID PRN Parks Ranger, DO       losartan (COZAAR) tablet 100 mg  100 mg Oral Daily Caroline Sauger, NP   100 mg at 02/03/22 9323   magnesium hydroxide (MILK OF MAGNESIA) suspension 30 mL  30 mL Oral Daily PRN Caroline Sauger, NP       metFORMIN (GLUCOPHAGE) tablet 1,000 mg  1,000 mg Oral Q breakfast Emrah Ariola T, MD   1,000 mg at 02/03/22 5573   metoprolol tartrate (LOPRESSOR) tablet 50 mg  50 mg Oral Daily Latresha Yahr, Madie Reno, MD   50 mg at 02/03/22 2202   sertraline (ZOLOFT) tablet 100 mg  100 mg Oral Daily Avnoor Koury, Madie Reno, MD   100 mg at 02/03/22 5427    Lab Results:  Results for orders placed or performed during the hospital encounter of 01/30/22 (from the past 48 hour(s))  Glucose, capillary     Status: Abnormal    Collection Time: 02/01/22 12:03 PM  Result Value Ref Range   Glucose-Capillary 178 (H) 70 - 99 mg/dL    Comment: Glucose reference range applies only to  samples taken after fasting for at least 8 hours.  Glucose, capillary     Status: Abnormal   Collection Time: 02/01/22  4:32 PM  Result Value Ref Range   Glucose-Capillary 107 (H) 70 - 99 mg/dL    Comment: Glucose reference range applies only to samples taken after fasting for at least 8 hours.  Glucose, capillary     Status: Abnormal   Collection Time: 02/01/22  9:15 PM  Result Value Ref Range   Glucose-Capillary 219 (H) 70 - 99 mg/dL    Comment: Glucose reference range applies only to samples taken after fasting for at least 8 hours.  Glucose, capillary     Status: Abnormal   Collection Time: 02/02/22  6:42 AM  Result Value Ref Range   Glucose-Capillary 168 (H) 70 - 99 mg/dL    Comment: Glucose reference range applies only to samples taken after fasting for at least 8 hours.  Glucose, capillary     Status: Abnormal   Collection Time: 02/03/22  6:56 AM  Result Value Ref Range   Glucose-Capillary 158 (H) 70 - 99 mg/dL    Comment: Glucose reference range applies only to samples taken after fasting for at least 8 hours.    Blood Alcohol level:  Lab Results  Component Value Date   ETH <10 44/07/270    Metabolic Disorder Labs: Lab Results  Component Value Date   HGBA1C 8.9 (A) 09/18/2021   MPG 214.47 04/29/2021   MPG 171.42 05/18/2019   No results found for: "PROLACTIN" Lab Results  Component Value Date   CHOL 256 (H) 06/19/2021   TRIG 325 (H) 06/19/2021   HDL 34 (L) 06/19/2021   CHOLHDL 7.5 (H) 06/19/2021   LDLCALC 160 (H) 06/19/2021    Physical Findings: AIMS:  , ,  ,  ,    CIWA:    COWS:     Musculoskeletal: Strength & Muscle Tone: within normal limits Gait & Station: normal Patient leans: N/A  Psychiatric Specialty Exam:  Presentation  General Appearance: Appropriate for Environment  Eye  Contact:Good  Speech:Clear and Coherent  Speech Volume:Normal  Handedness:Right   Mood and Affect  Mood:Depressed; Hopeless  Affect:Congruent   Thought Process  Thought Processes:Coherent  Descriptions of Associations:Intact  Orientation:Full (Time, Place and Person)  Thought Content:Logical  History of Schizophrenia/Schizoaffective disorder:No  Duration of Psychotic Symptoms:No data recorded Hallucinations:No data recorded Ideas of Reference:None  Suicidal Thoughts:No data recorded Homicidal Thoughts:No data recorded  Sensorium  Memory:Immediate Good; Remote Good; Recent Good  Judgment:Good  Insight:Good   Executive Functions  Concentration:Good  Attention Span:Good  [place] of Knowledge:Good  Language:Good   Psychomotor Activity  Psychomotor Activity:No data recorded  Assets  Assets:Desire for Improvement; Leisure Time; Physical Health; Resilience; Social Support; Housing; Financial Resources/Insurance   Sleep  Sleep:No data recorded   Physical Exam: Physical Exam Vitals and nursing note reviewed.  Constitutional:      Appearance: Normal appearance.  HENT:     Head: Normocephalic and atraumatic.     Mouth/Throat:     Pharynx: Oropharynx is clear.  Eyes:     Pupils: Pupils are equal, round, and reactive to light.  Cardiovascular:     Rate and Rhythm: Normal rate and regular rhythm.  Pulmonary:     Effort: Pulmonary effort is normal.     Breath sounds: Normal breath sounds.  Abdominal:     General: Abdomen is flat.     Palpations: Abdomen is soft.  Musculoskeletal:  General: Normal range of motion.  Skin:    General: Skin is warm and dry.  Neurological:     General: No focal deficit present.     Mental Status: She is alert. Mental status is at baseline.  Psychiatric:        Attention and Perception: Attention normal.        Mood and Affect: Mood is depressed.        Speech: Speech is delayed.         Behavior: Behavior is slowed.        Thought Content: Thought content includes suicidal ideation.    Review of Systems  Constitutional: Negative.   HENT: Negative.    Eyes: Negative.   Respiratory: Negative.    Cardiovascular: Negative.   Gastrointestinal: Negative.   Musculoskeletal: Negative.   Skin: Negative.   Neurological: Negative.   Psychiatric/Behavioral:  Positive for depression and suicidal ideas. The patient is nervous/anxious.    Blood pressure (!) 178/113, pulse 75, temperature 97.8 F (36.6 C), temperature source Oral, resp. rate 18, height '5\' 5"'$  (1.651 m), weight 85.7 kg, SpO2 100 %. Body mass index is 31.44 kg/m.   Treatment Plan Summary: Medication management and Plan patient at this point is continuing to have some suicidal thoughts.  Discourage her from discharge.  She agrees to stay in the hospital.  Unfortunately ECT not available the rest of this week after today but I do recommend adding a low-dose Abilify as a strategy to improve effectiveness of antidepressants.  If she continues to be severely depressed when ECT is available we are already in discussion about that.  Alethia Berthold, MD 02/03/2022, 11:28 AM

## 2022-02-03 NOTE — Plan of Care (Signed)
Patient pleasant and cooperative on approach. Rated her depression and anxiety 0/10.Patient sad and tearful with conversation. Patient states this morning " If I overdose again I will not call anyone for help." Patient denies SI,HI and AVH. Appropriate with staff & peers. Appetite and energy level good. ADLs maintained. Support and encouragement given.

## 2022-02-04 DIAGNOSIS — F4321 Adjustment disorder with depressed mood: Secondary | ICD-10-CM | POA: Diagnosis not present

## 2022-02-04 LAB — GLUCOSE, CAPILLARY: Glucose-Capillary: 190 mg/dL — ABNORMAL HIGH (ref 70–99)

## 2022-02-04 NOTE — Progress Notes (Signed)
[facility] MD Progress Note  02/04/2022 3:42 PM Kerry Webb  MRN:  [id number] Subjective: Follow-up patient with depression.  She reports that she is feeling better today.  Minimal thoughts about suicide.  Feeling more relaxed. Principal Problem: Adjustment disorder with depressed mood Diagnosis: Principal Problem:   Adjustment disorder with depressed mood Active Problems:   Primary hypertension   Diabetes mellitus without complication (HCC)   Severe recurrent major depression without psychotic features ([place])   Intentional overdose of gabapentin ([place])  Total Time spent with patient: 30 minutes  Past Psychiatric History: Past history of severe depression with recent suicide attempt  Past Medical History:  Past Medical History:  Diagnosis Date   Diabetes mellitus without complication ([place])    GERD (gastroesophageal reflux disease)    Heart murmur 1967   Hypertension 1993   Lump or mass in breast    Left   Seizures ([place]) 1983   Ulcer 1985    Past Surgical History:  Procedure Laterality Date   ABDOMINAL HYSTERECTOMY  2004   APPENDECTOMY  2014   BREAST BIOPSY Right 01-23-14   benign   BREAST SURGERY Left 01/14/12   BREAST SURGERY Right 05-16-14   benign   CHOLECYSTECTOMY N/A 05/18/2019   Procedure: LAPAROSCOPIC CHOLECYSTECTOMY;  Surgeon: Benjamine Sprague, DO;  Location: [facility] ORS;  Service: General;  Laterality: N/A;   COLONOSCOPY  2013   Dr. Candace Cruise   Family History:  Family History  Problem Relation Age of Onset   Liver cancer Mother    Cancer Mother        liver   Thyroid disease Mother    Kidney disease Father    Alcohol abuse Father    Other Brother        auto accident   Diabetes Maternal Aunt    Breast cancer Maternal Aunt    Cancer Maternal Grandmother        breast   Hypertension Paternal Grandmother    Kidney disease Paternal Grandfather    Hypertension Paternal Grandfather    Family Psychiatric  History: See previous Social History:  Social History    Substance and Sexual Activity  Alcohol Use Not Currently   Alcohol/week: 1.0 standard drink of alcohol   Types: 1 Glasses of wine per week   Comment: last use 03/2021     Social History   Substance and Sexual Activity  Drug Use Never    Social History   Socioeconomic History   Marital status: Married    Spouse name: Not on file   Number of children: Not on file   Years of education: Not on file   Highest education level: Not on file  Occupational History   Not on file  Tobacco Use   Smoking status: Never   Smokeless tobacco: Never  Vaping Use   Vaping Use: Never used  Substance and Sexual Activity   Alcohol use: Not Currently    Alcohol/week: 1.0 standard drink of alcohol    Types: 1 Glasses of wine per week    Comment: last use 03/2021   Drug use: Never   Sexual activity: Not on file  Other Topics Concern   Not on file  Social History Narrative   Not on file   Social Determinants of Health   Financial Resource Strain: Not on file  Food Insecurity: Food Insecurity Present (06/13/2021)   Hunger Vital Sign    Worried About Running Out of Food in the Last Year: Sometimes true    Ran Out  of Food in the Last Year: Sometimes true  Transportation Needs: No Transportation Needs (06/13/2021)   PRAPARE - Hydrologist (Medical): No    Lack of Transportation (Non-Medical): No  Physical Activity: Not on file  Stress: Not on file  Social Connections: Not on file   Additional Social History:                         Sleep: Fair  Appetite:  Fair  Current Medications: Current Facility-Administered Medications  Medication Dose Route Frequency Provider Last Rate Last Admin   acetaminophen (TYLENOL) tablet 650 mg  650 mg Oral Q6H PRN Caroline Sauger, NP   650 mg at 01/30/22 0825   alum & mag hydroxide-simeth (MAALOX/MYLANTA) 200-200-20 MG/5ML suspension 30 mL  30 mL Oral Q4H PRN Caroline Sauger, NP       ARIPiprazole  (ABILIFY) tablet 5 mg  5 mg Oral Daily Brandice Busser T, MD   5 mg at 02/04/22 0758   atorvastatin (LIPITOR) tablet 40 mg  40 mg Oral Daily Tiffani Kadow T, MD   40 mg at 02/03/22 2120   buPROPion (WELLBUTRIN XL) 24 hr tablet 300 mg  300 mg Oral Daily Jadd Gasior T, MD   300 mg at 02/04/22 0757   dapagliflozin propanediol (FARXIGA) tablet 10 mg  10 mg Oral Daily Champ Keetch, Madie Reno, MD   10 mg at 02/04/22 0758   gabapentin (NEURONTIN) capsule 300 mg  300 mg Oral QHS Jaslynn Thome T, MD   300 mg at 02/03/22 2120   hydrALAZINE (APRESOLINE) tablet 100 mg  100 mg Oral BID Jonathen Rathman T, MD   100 mg at 02/04/22 0757   hydrochlorothiazide (HYDRODIURIL) tablet 12.5 mg  12.5 mg Oral Daily Aydin Hink T, MD   12.5 mg at 02/04/22 0758   hydrOXYzine (ATARAX) tablet 25 mg  25 mg Oral TID PRN Caroline Sauger, NP   25 mg at 01/30/22 2133   insulin glargine-yfgn (SEMGLEE) injection 30 Units  30 Units Subcutaneous QHS Caroline Sauger, NP   30 Units at 02/03/22 2122   levETIRAcetam (KEPPRA) tablet 500 mg  500 mg Oral BID Emery Dupuy T, MD   500 mg at 02/04/22 0758   loperamide (IMODIUM) capsule 2 mg  2 mg Oral TID PRN Parks Ranger, DO       losartan (COZAAR) tablet 100 mg  100 mg Oral Daily Caroline Sauger, NP   100 mg at 02/04/22 0758   magnesium hydroxide (MILK OF MAGNESIA) suspension 30 mL  30 mL Oral Daily PRN Caroline Sauger, NP       metFORMIN (GLUCOPHAGE) tablet 1,000 mg  1,000 mg Oral Q breakfast Daishaun Ayre T, MD   1,000 mg at 02/04/22 0758   metoprolol tartrate (LOPRESSOR) tablet 50 mg  50 mg Oral Daily Chasidy Janak T, MD   50 mg at 02/04/22 0758   potassium chloride SA (KLOR-CON M) CR tablet 20 mEq  20 mEq Oral Daily Rito Ehrlich A, RPH   20 mEq at 02/04/22 0758   sertraline (ZOLOFT) tablet 100 mg  100 mg Oral Daily Hema Lanza, Madie Reno, MD   100 mg at 02/04/22 9211    Lab Results:  Results for orders placed or performed during the hospital encounter of 01/30/22 (from  the past 48 hour(s))  Glucose, capillary     Status: Abnormal   Collection Time: 02/03/22  6:56 AM  Result Value Ref Range   Glucose-Capillary  158 (H) 70 - 99 mg/dL    Comment: Glucose reference range applies only to samples taken after fasting for at least 8 hours.  Glucose, capillary     Status: Abnormal   Collection Time: 02/04/22  7:15 AM  Result Value Ref Range   Glucose-Capillary 190 (H) 70 - 99 mg/dL    Comment: Glucose reference range applies only to samples taken after fasting for at least 8 hours.    Blood Alcohol level:  Lab Results  Component Value Date   ETH <10 25/42/7062    Metabolic Disorder Labs: Lab Results  Component Value Date   HGBA1C 8.9 (A) 09/18/2021   MPG 214.47 04/29/2021   MPG 171.42 05/18/2019   No results found for: "PROLACTIN" Lab Results  Component Value Date   CHOL 256 (H) 06/19/2021   TRIG 325 (H) 06/19/2021   HDL 34 (L) 06/19/2021   CHOLHDL 7.5 (H) 06/19/2021   LDLCALC 160 (H) 06/19/2021    Physical Findings: AIMS:  , ,  ,  ,    CIWA:    COWS:     Musculoskeletal: Strength & Muscle Tone: within normal limits Gait & Station: normal Patient leans: N/A  Psychiatric Specialty Exam:  Presentation  General Appearance: Appropriate for Environment  Eye Contact:Good  Speech:Clear and Coherent  Speech Volume:Normal  Handedness:Right   Mood and Affect  Mood:Depressed; Hopeless  Affect:Congruent   Thought Process  Thought Processes:Coherent  Descriptions of Associations:Intact  Orientation:Full (Time, Place and Person)  Thought Content:Logical  History of Schizophrenia/Schizoaffective disorder:No  Duration of Psychotic Symptoms:No data recorded Hallucinations:No data recorded Ideas of Reference:None  Suicidal Thoughts:No data recorded Homicidal Thoughts:No data recorded  Sensorium  Memory:Immediate Good; Remote Good; Recent Good  Judgment:Good  Insight:Good   Executive Functions   Concentration:Good  Attention Span:Good  [place] of Knowledge:Good  Language:Good   Psychomotor Activity  Psychomotor Activity:No data recorded  Assets  Assets:Desire for Improvement; Leisure Time; Physical Health; Resilience; Social Support; Housing; Financial Resources/Insurance   Sleep  Sleep:No data recorded   Physical Exam: Physical Exam Vitals and nursing note reviewed.  Constitutional:      Appearance: Normal appearance.  HENT:     Head: Normocephalic and atraumatic.     Mouth/Throat:     Pharynx: Oropharynx is clear.  Eyes:     Pupils: Pupils are equal, round, and reactive to light.  Cardiovascular:     Rate and Rhythm: Normal rate and regular rhythm.  Pulmonary:     Effort: Pulmonary effort is normal.     Breath sounds: Normal breath sounds.  Abdominal:     General: Abdomen is flat.     Palpations: Abdomen is soft.  Musculoskeletal:        General: Normal range of motion.  Skin:    General: Skin is warm and dry.  Neurological:     General: No focal deficit present.     Mental Status: She is alert. Mental status is at baseline.  Psychiatric:        Attention and Perception: Attention normal.        Mood and Affect: Mood normal.        Speech: Speech normal.        Behavior: Behavior is cooperative.        Thought Content: Thought content normal.        Cognition and Memory: Cognition normal.        Judgment: Judgment normal.    Review of Systems  Constitutional: Negative.  HENT: Negative.    Eyes: Negative.   Respiratory: Negative.    Cardiovascular: Negative.   Gastrointestinal: Negative.   Musculoskeletal: Negative.   Skin: Negative.   Neurological: Negative.   Psychiatric/Behavioral: Negative.     Blood pressure (!) 154/94, pulse 70, temperature 98.5 F (36.9 C), temperature source Oral, resp. rate 17, height '5\' 5"'$  (1.651 m), weight 85.7 kg, SpO2 99 %. Body mass index is 31.44 kg/m.   Treatment Plan  Summary: Medication management and Plan continue current medicine.  Supportive counseling.  Encourage group attendance.  Possible discharge 1 to 3 days  Alethia Berthold, MD 02/04/2022, 3:42 PM

## 2022-02-04 NOTE — Plan of Care (Signed)
  Problem: Education: Goal: Knowledge of [place] [facility] Education information/materials will improve Outcome: Progressing Goal: Emotional status will improve Outcome: Progressing Goal: Mental status will improve Outcome: Progressing Goal: Verbalization of understanding the information provided will improve Outcome: Progressing   Problem: Safety: Goal: Periods of time without injury will increase Outcome: Progressing   Problem: Education: Goal: Knowledge of the prescribed therapeutic regimen will improve Outcome: Progressing   Problem: Safety: Goal: Ability to disclose and discuss suicidal ideas will improve Outcome: Progressing

## 2022-02-04 NOTE — Progress Notes (Signed)
D: Patient alert and oriented. Patient denies pain. Patient denies anxiety and depression. Patient denies SI/HI/AVH. Patient has been observed in the dayroom interacting with peers and staff members. Patient has spent majority of shift isolative to self in room but is interactive upon approach.   A: Scheduled medications administered to patient, per MD orders.  Support and encouragement provided to patient.  Q15 minute safety checks maintained.   R: Patient compliant with medication administration and treatment plan. No adverse drug reactions noted. Patient remains safe on the unit at this time.

## 2022-02-04 NOTE — Plan of Care (Signed)
  Problem: Education: Goal: Knowledge of General Education information will improve Description: Including pain rating scale, medication(s)/side effects and non-pharmacologic comfort measures Outcome: Progressing   Problem: Health Behavior/Discharge Planning: Goal: Ability to manage health-related needs will improve Outcome: Progressing   Problem: Clinical Measurements: Goal: Ability to maintain clinical measurements within normal limits will improve Outcome: Progressing Goal: Will remain free from infection Outcome: Progressing Goal: Diagnostic test results will improve Outcome: Progressing Goal: Respiratory complications will improve Outcome: Progressing Goal: Cardiovascular complication will be avoided Outcome: Progressing   Problem: Safety: Goal: Ability to disclose and discuss suicidal ideas will improve Outcome: Progressing Goal: Ability to identify and utilize support systems that promote safety will improve Outcome: Progressing   Problem: Education: Goal: Utilization of techniques to improve thought processes will improve Outcome: Progressing Goal: Knowledge of the prescribed therapeutic regimen will improve Outcome: Progressing   Problem: Safety: Goal: Periods of time without injury will increase Outcome: Progressing   Problem: Education: Goal: Knowledge of [place] [facility] Education information/materials will improve Outcome: Progressing Goal: Emotional status will improve Outcome: Progressing Goal: Mental status will improve Outcome: Progressing Goal: Verbalization of understanding the information provided will improve Outcome: Progressing   Problem: Skin Integrity: Goal: Risk for impaired skin integrity will decrease Outcome: Progressing

## 2022-02-04 NOTE — Progress Notes (Signed)
Patient is active in the unit. In the dayroom watching TV.  Gets along well with the other patients on the unit.  In a pleasant mood.  Medication compliant and received meds without issues.  Denies si/hi/avh.  Reports being ready for discharge. Support and encouragement offered.  Will continue  to monitor with q 15 minute safety checks.      C Butler-Nicholson,LPN

## 2022-02-05 DIAGNOSIS — F4321 Adjustment disorder with depressed mood: Secondary | ICD-10-CM | POA: Diagnosis not present

## 2022-02-05 LAB — GLUCOSE, CAPILLARY: Glucose-Capillary: 164 mg/dL — ABNORMAL HIGH (ref 70–99)

## 2022-02-05 MED ORDER — HYDROCHLOROTHIAZIDE 25 MG PO TABS
25.0000 mg | ORAL_TABLET | Freq: Every day | ORAL | Status: DC
  Administered 2022-02-06: 25 mg via ORAL
  Filled 2022-02-05: qty 1

## 2022-02-05 NOTE — Progress Notes (Signed)
   02/05/22 1615  Clinical Encounter Type  Visited With Patient  Visit Type Follow-up;Spiritual support;Social support   Daryel November offered a brief f/u with pt with whom a relationship was formed when pt first arrived in ED. This chaplain did not realize pt was still on unit, so both pt and chaplain glad to see one another.  Pt able to share feeling improvement and satisfaction with plan to d/c on 8/3. This chaplain will attempt to see pt again prior to that time if possible. Offered pt best wishes and continued prayer to lift her in her continued healing and well-being.

## 2022-02-05 NOTE — Group Note (Signed)
[place] LCSW Group Therapy Note   Group Date: 02/05/2022 Start Time: 1300 End Time: 1400   Type of Therapy/Topic:  Group Therapy:  Emotion Regulation  Participation Level:  Did Not Attend    Description of Group:    The purpose of this group is to assist patients in learning to regulate negative emotions and experience positive emotions. Patients will be guided to discuss ways in which they have been vulnerable to their negative emotions. These vulnerabilities will be juxtaposed with experiences of positive emotions or situations, and patients challenged to use positive emotions to combat negative ones. Special emphasis will be placed on coping with negative emotions in conflict situations, and patients will process healthy conflict resolution skills.  Therapeutic Goals: Patient will identify two positive emotions or experiences to reflect on in order to balance out negative emotions:  Patient will label two or more emotions that they find the most difficult to experience:  Patient will be able to demonstrate positive conflict resolution skills through discussion or role plays:   Summary of Patient Progress: Despite invitation, pt declined to participate in group.   Therapeutic Modalities:   Cognitive Behavioral Therapy Feelings Identification Dialectical Behavioral Therapy   Shirl Harris, LCSW

## 2022-02-05 NOTE — BH IP Treatment Plan (Signed)
Interdisciplinary Treatment and Diagnostic Plan Update  02/05/2022 Time of Session: 8:30 AM Kerry Webb MRN: [id number]  Principal Diagnosis: Adjustment disorder with depressed mood  Secondary Diagnoses: Principal Problem:   Adjustment disorder with depressed mood Active Problems:   Primary hypertension   Diabetes mellitus without complication ([place])   Severe recurrent major depression without psychotic features ([place])   Intentional overdose of gabapentin ([place])   Current Medications:  Current Facility-Administered Medications  Medication Dose Route Frequency Provider Last Rate Last Admin   acetaminophen (TYLENOL) tablet 650 mg  650 mg Oral Q6H PRN Caroline Sauger, NP   650 mg at 01/30/22 0825   alum & mag hydroxide-simeth (MAALOX/MYLANTA) 200-200-20 MG/5ML suspension 30 mL  30 mL Oral Q4H PRN Caroline Sauger, NP       ARIPiprazole (ABILIFY) tablet 5 mg  5 mg Oral Daily Clapacs, John T, MD   5 mg at 02/05/22 0809   atorvastatin (LIPITOR) tablet 40 mg  40 mg Oral Daily Clapacs, John T, MD   40 mg at 02/04/22 2051   buPROPion (WELLBUTRIN XL) 24 hr tablet 300 mg  300 mg Oral Daily Clapacs, John T, MD   300 mg at 02/05/22 0809   dapagliflozin propanediol (FARXIGA) tablet 10 mg  10 mg Oral Daily Clapacs, Madie Reno, MD   10 mg at 02/05/22 1594   gabapentin (NEURONTIN) capsule 300 mg  300 mg Oral QHS Clapacs, John T, MD   300 mg at 02/04/22 2051   hydrALAZINE (APRESOLINE) tablet 100 mg  100 mg Oral BID Clapacs, John T, MD   100 mg at 02/05/22 0813   hydrochlorothiazide (HYDRODIURIL) tablet 12.5 mg  12.5 mg Oral Daily Clapacs, John T, MD   12.5 mg at 02/05/22 0809   hydrOXYzine (ATARAX) tablet 25 mg  25 mg Oral TID PRN Caroline Sauger, NP   25 mg at 01/30/22 2133   insulin glargine-yfgn (SEMGLEE) injection 30 Units  30 Units Subcutaneous QHS Caroline Sauger, NP   30 Units at 02/04/22 2123   levETIRAcetam (KEPPRA) tablet 500 mg  500 mg Oral BID Clapacs, John T, MD   500 mg at  02/05/22 0810   loperamide (IMODIUM) capsule 2 mg  2 mg Oral TID PRN Parks Ranger, DO       losartan (COZAAR) tablet 100 mg  100 mg Oral Daily Caroline Sauger, NP   100 mg at 02/05/22 0813   magnesium hydroxide (MILK OF MAGNESIA) suspension 30 mL  30 mL Oral Daily PRN Caroline Sauger, NP       metFORMIN (GLUCOPHAGE) tablet 1,000 mg  1,000 mg Oral Q breakfast Clapacs, John T, MD   1,000 mg at 02/05/22 7076   metoprolol tartrate (LOPRESSOR) tablet 50 mg  50 mg Oral Daily Clapacs, John T, MD   50 mg at 02/05/22 0813   potassium chloride SA (KLOR-CON M) CR tablet 20 mEq  20 mEq Oral Daily Rito Ehrlich A, RPH   20 mEq at 02/05/22 0809   sertraline (ZOLOFT) tablet 100 mg  100 mg Oral Daily Clapacs, Madie Reno, MD   100 mg at 02/05/22 0808   PTA Medications: Medications Prior to Admission  Medication Sig Dispense Refill Last Dose   blood glucose meter kit and supplies KIT USE AS DIRECTED TO CHECK BLOOD SUGAR UP TO 4 TIMES DAILY. 1 each 0    buPROPion (WELLBUTRIN XL) 150 MG 24 hr tablet Take 1 tablet (150 mg total) by mouth daily. 30 tablet 1    dapagliflozin propanediol (  FARXIGA) 10 MG TABS tablet Take 1 tablet (10 mg total) by mouth every morning. 30 tablet 1    glucose blood (RIGHTEST GS550 BLOOD GLUCOSE) test strip USE AS DIRECTED TO CHECK BLOOD SUGAR UP TO 4 TIMES DAILY. 100 strip 0    hydrALAZINE (APRESOLINE) 50 MG tablet Take 1 tablet (50 mg total) by mouth 2 (two) times daily. 60 tablet 1    insulin glargine-yfgn (SEMGLEE) 100 UNIT/ML injection Inject 0.3 mLs (30 Units total) into the skin at bedtime. 10 mL 1    Insulin Pen Needle 32G X 4 MM MISC Use as directed with insulin pen 100 each 0    metFORMIN (FORTAMET) 1000 MG (OSM) 24 hr tablet Take 1 tablet (1,000 mg total) by mouth daily with breakfast. 30 tablet 1    metoprolol tartrate (LOPRESSOR) 50 MG tablet Take 1 tablet (50 mg total) by mouth daily. 30 tablet 1    Rightest GL300 Lancets MISC USE AS DIRECTED TO CHECK BLOOD  SUGAR UP TO 4 TIMES DAILY. 100 each 0    [DISCONTINUED] atorvastatin (LIPITOR) 40 MG tablet Take 1 tablet (40 mg total) by mouth once daily. 30 tablet 1    [DISCONTINUED] gabapentin (NEURONTIN) 300 MG capsule Take 1 capsule (300 mg total) by mouth once nightly at bedtime. 30 capsule 1    [DISCONTINUED] hydrOXYzine (ATARAX) 25 MG tablet Take 1 tablet (25 mg total) by mouth 3 (three) times daily as needed for anxiety (or sleep). 30 tablet 1    [DISCONTINUED] levETIRAcetam (KEPPRA) 500 MG tablet Take 1 tablet (500 mg total) by mouth 2 (two) times daily. 60 tablet 1    [DISCONTINUED] losartan (COZAAR) 100 MG tablet Take 1 tablet (100 mg total) by mouth once daily. 30 tablet 1    [DISCONTINUED] sertraline (ZOLOFT) 100 MG tablet Take 1 tablet (100 mg total) by mouth once daily. 30 tablet 1     Patient Stressors: Financial difficulties   Medication change or noncompliance    Patient Strengths: Capable of independent living  Motivation for treatment/growth   Treatment Modalities: Medication Management, Group therapy, Case management,  1 to 1 session with clinician, Psychoeducation, Recreational therapy.   Physician Treatment Plan for Primary Diagnosis: Adjustment disorder with depressed mood Long Term Goal(s): Improvement in symptoms so as ready for discharge   Short Term Goals: Compliance with prescribed medications will improve Ability to disclose and discuss suicidal ideas Ability to demonstrate self-control will improve Ability to identify and develop effective coping behaviors will improve  Medication Management: Evaluate patient's response, side effects, and tolerance of medication regimen.  Therapeutic Interventions: 1 to 1 sessions, Unit Group sessions and Medication administration.  Evaluation of Outcomes: Progressing  Physician Treatment Plan for Secondary Diagnosis: Principal Problem:   Adjustment disorder with depressed mood Active Problems:   Primary hypertension   Diabetes  mellitus without complication (H[facility])   Severe recurrent major depression without psychotic features ([place])   Intentional overdose of gabapentin ([place])  Long Term Goal(s): Improvement in symptoms so as ready for discharge   Short Term Goals: Compliance with prescribed medications will improve Ability to disclose and discuss suicidal ideas Ability to demonstrate self-control will improve Ability to identify and develop effective coping behaviors will improve     Medication Management: Evaluate patient's response, side effects, and tolerance of medication regimen.  Therapeutic Interventions: 1 to 1 sessions, Unit Group sessions and Medication administration.  Evaluation of Outcomes: Progressing   RN Treatment Plan for Primary Diagnosis: Adjustment disorder with depressed mood Long  Term Goal(s): Knowledge of disease and therapeutic regimen to maintain health will improve  Short Term Goals: Ability to remain free from injury will improve, Ability to verbalize frustration and anger appropriately will improve, Ability to demonstrate self-control, Ability to participate in decision making will improve, Ability to verbalize feelings will improve, Ability to disclose and discuss suicidal ideas, Ability to identify and develop effective coping behaviors will improve, and Compliance with prescribed medications will improve  Medication Management: RN will administer medications as ordered by provider, will assess and evaluate patient's response and provide education to patient for prescribed medication. RN will report any adverse and/or side effects to prescribing provider.  Therapeutic Interventions: 1 on 1 counseling sessions, Psychoeducation, Medication administration, Evaluate responses to treatment, Monitor vital signs and CBGs as ordered, Perform/monitor CIWA, COWS, AIMS and Fall Risk screenings as ordered, Perform wound care treatments as ordered.  Evaluation of Outcomes: Progressing   LCSW  Treatment Plan for Primary Diagnosis: Adjustment disorder with depressed mood Long Term Goal(s): Safe transition to appropriate next level of care at discharge, Engage patient in therapeutic group addressing interpersonal concerns.  Short Term Goals: Engage patient in aftercare planning with referrals and resources, Increase social support, Increase ability to appropriately verbalize feelings, Increase emotional regulation, Facilitate acceptance of mental health diagnosis and concerns, and Increase skills for wellness and recovery  Therapeutic Interventions: Assess for all discharge needs, 1 to 1 time with Social worker, Explore available resources and support systems, Assess for adequacy in community support network, Educate family and significant other(s) on suicide prevention, Complete Psychosocial Assessment, Interpersonal group therapy.  Evaluation of Outcomes: Progressing   Progress in Treatment: Attending groups: No. Participating in groups: No. Taking medication as prescribed: Yes. Toleration medication: Yes. Family/Significant other contact made: No, will contact:  if given permission. Patient understands diagnosis: Yes. Discussing patient identified problems/goals with staff: Yes. Medical problems stabilized or resolved: Yes. Denies suicidal/homicidal ideation: Yes. Issues/concerns per patient self-inventory: No. Other: none.  New problem(s) identified: No, Describe:  none Update 02/05/22: No changes at this time.    New Short Term/Long Term Goal(s): elimination of symptoms of psychosis, medication management for mood stabilization; elimination of SI thoughts; development of comprehensive mental wellness/sobriety plan. Update 02/05/22: No changes at this time.    Patient Goals:  "be able to function, not let everything be a stressor" Update 02/05/22: No changes at this time.    Discharge Plan or Barriers: Patient reports plans to move to the home of her aunt in [place].  She  reports plans to follow up with the [facility] in [facility] and appointments have been scheduled. Patient asked for food and housing assistance in both areas and CSW will assist with providing resources. Update 02/05/22: No changes at this time.    Reason for Continuation of Hospitalization: Anxiety Depression Medication stabilization Suicidal ideation   Estimated Length of Stay:  TBD Update 02/05/22: No changes at this time.   Last 3 [place] Suicide Severity Risk Score: Flowsheet Row Admission (Current) from 01/30/2022 in [facility] ED from 01/29/2022 in [facility] Admission (Discharged) from 12/24/2021 in [facility] High Risk Error: Q6 is Yes, you must answer 7 High Risk       Last PHQ 2/9 Scores:    10/24/2021    6:10 PM 10/16/2021    5:08 PM 10/03/2021    9:07 AM  Depression screen PHQ 2/9  Decreased Interest 3 1 1   Down,  Depressed, Hopeless 3 1 1   PHQ - 2 Score 6 2 2   Altered sleeping 3 3 3   Tired, decreased energy 2 2 3   Change in appetite 2 0 0  Feeling bad or failure about yourself  2 0 1  Trouble concentrating 2 0 0  Moving slowly or fidgety/restless 0 0 0  Suicidal thoughts 1 0 0  PHQ-9 Score 18 7 9   Difficult doing work/chores Very difficult  Very difficult    Scribe for Treatment Team: Shirl Harris, LCSW 02/05/2022 8:43 AM

## 2022-02-05 NOTE — Progress Notes (Signed)
Kerry Webb [place] MD Progress Note  02/05/2022 1:19 PM Kerry Webb  MRN:  [id number] Subjective: Follow-up 56 year old woman with depression.  Today once again she told me that she is feeling better.  She is neatly dressed and groomed and her affect was smiling and calm.  She denies having any suicidal thoughts for the last day.  She articulates an appropriate plan for taking care of her immediate problems at discharge.  Tolerating medicine well. Principal Problem: Adjustment disorder with depressed mood Diagnosis: Principal Problem:   Adjustment disorder with depressed mood Active Problems:   Primary hypertension   Diabetes mellitus without complication (H[facility])   Severe recurrent major depression without psychotic features ([place])   Intentional overdose of gabapentin ([place])  Total Time spent with patient: 30 minutes  Past Psychiatric History: Past history of depression with recent suicide attempt  Past Medical History:  Past Medical History:  Diagnosis Date   Diabetes mellitus without complication ([place])    GERD (gastroesophageal reflux disease)    Heart murmur 1967   Hypertension 1993   Lump or mass in breast    Left   Seizures ([place]) 1983   Ulcer 1985    Past Surgical History:  Procedure Laterality Date   ABDOMINAL HYSTERECTOMY  2004   APPENDECTOMY  2014   BREAST BIOPSY Right 01-23-14   benign   BREAST SURGERY Left 01/14/12   BREAST SURGERY Right 05-16-14   benign   CHOLECYSTECTOMY N/A 05/18/2019   Procedure: LAPAROSCOPIC CHOLECYSTECTOMY;  Surgeon: Benjamine Sprague, DO;  Location: [facility] ORS;  Service: General;  Laterality: N/A;   COLONOSCOPY  2013   Dr. Candace Cruise   Family History:  Family History  Problem Relation Age of Onset   Liver cancer Mother    Cancer Mother        liver   Thyroid disease Mother    Kidney disease Father    Alcohol abuse Father    Other Brother        auto accident   Diabetes Maternal Aunt    Breast cancer Maternal Aunt    Cancer Maternal Grandmother         breast   Hypertension Paternal Grandmother    Kidney disease Paternal Grandfather    Hypertension Paternal Grandfather    Family Psychiatric  History: See previous Social History:  Social History   Substance and Sexual Activity  Alcohol Use Not Currently   Alcohol/week: 1.0 standard drink of alcohol   Types: 1 Glasses of wine per week   Comment: last use 03/2021     Social History   Substance and Sexual Activity  Drug Use Never    Social History   Socioeconomic History   Marital status: Married    Spouse name: Not on file   Number of children: Not on file   Years of education: Not on file   Highest education level: Not on file  Occupational History   Not on file  Tobacco Use   Smoking status: Never   Smokeless tobacco: Never  Vaping Use   Vaping Use: Never used  Substance and Sexual Activity   Alcohol use: Not Currently    Alcohol/week: 1.0 standard drink of alcohol    Types: 1 Glasses of wine per week    Comment: last use 03/2021   Drug use: Never   Sexual activity: Not on file  Other Topics Concern   Not on file  Social History Narrative   Not on file   Social Determinants of Health  Financial Resource Strain: Not on file  Food Insecurity: Food Insecurity Present (06/13/2021)   Hunger Vital Sign    Worried About Running Out of Food in the Last Year: Sometimes true    Ran Out of Food in the Last Year: Sometimes true  Transportation Needs: No Transportation Needs (06/13/2021)   PRAPARE - Hydrologist (Medical): No    Lack of Transportation (Non-Medical): No  Physical Activity: Not on file  Stress: Not on file  Social Connections: Not on file   Additional Social History:                         Sleep: Fair  Appetite:  Fair  Current Medications: Current Facility-Administered Medications  Medication Dose Route Frequency Provider Last Rate Last Admin   acetaminophen (TYLENOL) tablet 650 mg  650 mg Oral Q6H PRN  Caroline Sauger, NP   650 mg at 01/30/22 0825   alum & mag hydroxide-simeth (MAALOX/MYLANTA) 200-200-20 MG/5ML suspension 30 mL  30 mL Oral Q4H PRN Caroline Sauger, NP       ARIPiprazole (ABILIFY) tablet 5 mg  5 mg Oral Daily Juliany Daughety T, MD   5 mg at 02/05/22 0809   atorvastatin (LIPITOR) tablet 40 mg  40 mg Oral Daily Harrell Niehoff T, MD   40 mg at 02/04/22 2051   buPROPion (WELLBUTRIN XL) 24 hr tablet 300 mg  300 mg Oral Daily Valicia Rief T, MD   300 mg at 02/05/22 0809   dapagliflozin propanediol (FARXIGA) tablet 10 mg  10 mg Oral Daily Navid Lenzen, Madie Reno, MD   10 mg at 02/05/22 2458   gabapentin (NEURONTIN) capsule 300 mg  300 mg Oral QHS Lexus Shampine T, MD   300 mg at 02/04/22 2051   hydrALAZINE (APRESOLINE) tablet 100 mg  100 mg Oral BID Malikhi Ogan T, MD   100 mg at 02/05/22 0813   hydrochlorothiazide (HYDRODIURIL) tablet 12.5 mg  12.5 mg Oral Daily Marland Reine T, MD   12.5 mg at 02/05/22 0809   hydrOXYzine (ATARAX) tablet 25 mg  25 mg Oral TID PRN Caroline Sauger, NP   25 mg at 01/30/22 2133   insulin glargine-yfgn (SEMGLEE) injection 30 Units  30 Units Subcutaneous QHS Caroline Sauger, NP   30 Units at 02/04/22 2123   levETIRAcetam (KEPPRA) tablet 500 mg  500 mg Oral BID Megan Presti T, MD   500 mg at 02/05/22 0810   loperamide (IMODIUM) capsule 2 mg  2 mg Oral TID PRN Parks Ranger, DO       losartan (COZAAR) tablet 100 mg  100 mg Oral Daily Caroline Sauger, NP   100 mg at 02/05/22 0813   magnesium hydroxide (MILK OF MAGNESIA) suspension 30 mL  30 mL Oral Daily PRN Caroline Sauger, NP       metFORMIN (GLUCOPHAGE) tablet 1,000 mg  1,000 mg Oral Q breakfast Natnael Biederman T, MD   1,000 mg at 02/05/22 0998   metoprolol tartrate (LOPRESSOR) tablet 50 mg  50 mg Oral Daily Diogo Anne T, MD   50 mg at 02/05/22 0813   potassium chloride SA (KLOR-CON M) CR tablet 20 mEq  20 mEq Oral Daily Rito Ehrlich A, RPH   20 mEq at 02/05/22 0809    sertraline (ZOLOFT) tablet 100 mg  100 mg Oral Daily Natha Guin, Madie Reno, MD   100 mg at 02/05/22 3382    Lab Results:  Results for orders placed  or performed during the hospital encounter of 01/30/22 (from the past 48 hour(s))  Glucose, capillary     Status: Abnormal   Collection Time: 02/04/22  7:15 AM  Result Value Ref Range   Glucose-Capillary 190 (H) 70 - 99 mg/dL    Comment: Glucose reference range applies only to samples taken after fasting for at least 8 hours.  Glucose, capillary     Status: Abnormal   Collection Time: 02/05/22  6:54 AM  Result Value Ref Range   Glucose-Capillary 164 (H) 70 - 99 mg/dL    Comment: Glucose reference range applies only to samples taken after fasting for at least 8 hours.    Blood Alcohol level:  Lab Results  Component Value Date   ETH <10 38/04/1750    Metabolic Disorder Labs: Lab Results  Component Value Date   HGBA1C 8.9 (A) 09/18/2021   MPG 214.47 04/29/2021   MPG 171.42 05/18/2019   No results found for: "PROLACTIN" Lab Results  Component Value Date   CHOL 256 (H) 06/19/2021   TRIG 325 (H) 06/19/2021   HDL 34 (L) 06/19/2021   CHOLHDL 7.5 (H) 06/19/2021   LDLCALC 160 (H) 06/19/2021    Physical Findings: AIMS:  , ,  ,  ,    CIWA:    COWS:     Musculoskeletal: Strength & Muscle Tone: within normal limits Gait & Station: normal Patient leans: N/A  Psychiatric Specialty Exam:  Presentation  General Appearance: Appropriate for Environment  Eye Contact:Good  Speech:Clear and Coherent  Speech Volume:Normal  Handedness:Right   Mood and Affect  Mood:Depressed; Hopeless  Affect:Congruent   Thought Process  Thought Processes:Coherent  Descriptions of Associations:Intact  Orientation:Full (Time, Place and Person)  Thought Content:Logical  History of Schizophrenia/Schizoaffective disorder:No  Duration of Psychotic Symptoms:No data recorded Hallucinations:No data recorded Ideas of Reference:None  Suicidal  Thoughts:No data recorded Homicidal Thoughts:No data recorded  Sensorium  Memory:Immediate Good; Remote Good; Recent Good  Judgment:Good  Insight:Good   Executive Functions  Concentration:Good  Attention Span:Good  [place] of Knowledge:Good  Language:Good   Psychomotor Activity  Psychomotor Activity:No data recorded  Assets  Assets:Desire for Improvement; Leisure Time; Physical Health; Resilience; Social Support; Housing; Financial Resources/Insurance   Sleep  Sleep:No data recorded   Physical Exam: Physical Exam Vitals and nursing note reviewed.  Constitutional:      Appearance: Normal appearance.  HENT:     Head: Normocephalic and atraumatic.     Mouth/Throat:     Pharynx: Oropharynx is clear.  Eyes:     Pupils: Pupils are equal, round, and reactive to light.  Cardiovascular:     Rate and Rhythm: Normal rate and regular rhythm.  Pulmonary:     Effort: Pulmonary effort is normal.     Breath sounds: Normal breath sounds.  Abdominal:     General: Abdomen is flat.     Palpations: Abdomen is soft.  Musculoskeletal:        General: Normal range of motion.  Skin:    General: Skin is warm and dry.  Neurological:     General: No focal deficit present.     Mental Status: She is alert. Mental status is at baseline.  Psychiatric:        Mood and Affect: Mood normal.        Thought Content: Thought content normal.    Review of Systems  Constitutional: Negative.   HENT: Negative.    Eyes: Negative.   Respiratory: Negative.    Cardiovascular: Negative.  Gastrointestinal: Negative.   Musculoskeletal: Negative.   Skin: Negative.   Neurological: Negative.   Psychiatric/Behavioral: Negative.  Negative for suicidal ideas.    Blood pressure (!) 161/107, pulse 96, temperature 98.5 F (36.9 C), temperature source Oral, resp. rate 16, height '5\' 5"'$  (1.651 m), weight 85.7 kg, SpO2 100 %. Body mass index is 31.44 kg/m.   Treatment Plan  Summary: Medication management and Plan no change to medication.  Reviewed new medicine with patient.  Suggested to her that she would probably be able to be discharged tomorrow.  Patient agrees.  We will tentatively make some plans for discharge.  I note that despite blood pressure medicine she continues to run hypertensive and will need to try to adjust her meds a little more.  Alethia Berthold, MD 02/05/2022, 1:19 PM

## 2022-02-05 NOTE — Progress Notes (Signed)
Patient is calm and cooperative with assessment. She denies SI, HI, and AVH. She states she is feeling better. Patient denies pain and other physical problems. She is compliant with scheduled medications. Patient remains safe on the unit at this time.

## 2022-02-05 NOTE — Plan of Care (Signed)
  Problem: Education: Goal: Knowledge of General Education information will improve Description: Including pain rating scale, medication(s)/side effects and non-pharmacologic comfort measures Outcome: Progressing   Problem: Health Behavior/Discharge Planning: Goal: Ability to manage health-related needs will improve Outcome: Progressing   Problem: Clinical Measurements: Goal: Ability to maintain clinical measurements within normal limits will improve Outcome: Progressing Goal: Will remain free from infection Outcome: Progressing Goal: Diagnostic test results will improve Outcome: Progressing Goal: Respiratory complications will improve Outcome: Progressing Goal: Cardiovascular complication will be avoided Outcome: Progressing   Problem: Safety: Goal: Ability to disclose and discuss suicidal ideas will improve Outcome: Progressing Goal: Ability to identify and utilize support systems that promote safety will improve Outcome: Progressing   Problem: Education: Goal: Utilization of techniques to improve thought processes will improve Outcome: Progressing Goal: Knowledge of the prescribed therapeutic regimen will improve Outcome: Progressing   Problem: Safety: Goal: Periods of time without injury will increase Outcome: Progressing   Problem: Education: Goal: Knowledge of [facility] Education information/materials will improve Outcome: Progressing Goal: Emotional status will improve Outcome: Progressing Goal: Mental status will improve Outcome: Progressing Goal: Verbalization of understanding the information provided will improve Outcome: Progressing   Problem: Skin Integrity: Goal: Risk for impaired skin integrity will decrease Outcome: Progressing   Problem: Safety: Goal: Ability to remain free from injury will improve Outcome: Progressing   Problem: Pain Managment: Goal: General experience of comfort will improve Outcome: Progressing   Problem:  Elimination: Goal: Will not experience complications related to bowel motility Outcome: Progressing Goal: Will not experience complications related to urinary retention Outcome: Progressing   Problem: Coping: Goal: Level of anxiety will decrease Outcome: Progressing

## 2022-02-05 NOTE — Progress Notes (Signed)
Patient received insulin injection.  Tolerated without incident. Q 15 minute safety checks in place. Will continue to monitor.       C Butler-Nicholson, LPN

## 2022-02-06 DIAGNOSIS — F4321 Adjustment disorder with depressed mood: Secondary | ICD-10-CM | POA: Diagnosis not present

## 2022-02-06 LAB — GLUCOSE, CAPILLARY: Glucose-Capillary: 157 mg/dL — ABNORMAL HIGH (ref 70–99)

## 2022-02-06 MED ORDER — DAPAGLIFLOZIN PROPANEDIOL 10 MG PO TABS: 10.0000 mg | ORAL_TABLET | Freq: Every day | ORAL | 1 refills | Status: AC

## 2022-02-06 MED ORDER — ARIPIPRAZOLE 5 MG PO TABS: 5.0000 mg | ORAL_TABLET | Freq: Every day | ORAL | 1 refills | Status: AC

## 2022-02-06 MED ORDER — SERTRALINE HCL 100 MG PO TABS: 100.0000 mg | ORAL_TABLET | Freq: Every day | ORAL | 1 refills | Status: AC

## 2022-02-06 MED ORDER — POTASSIUM CHLORIDE CRYS ER 20 MEQ PO TBCR: 20.0000 meq | EXTENDED_RELEASE_TABLET | Freq: Every day | ORAL | 1 refills | Status: AC

## 2022-02-06 MED ORDER — BUPROPION HCL ER (XL) 300 MG PO TB24: 300.0000 mg | ORAL_TABLET | Freq: Every day | ORAL | 1 refills | Status: AC

## 2022-02-06 MED ORDER — GABAPENTIN 300 MG PO CAPS: 300.0000 mg | ORAL_CAPSULE | Freq: Every day | ORAL | 1 refills | Status: AC

## 2022-02-06 MED ORDER — METFORMIN HCL 1000 MG PO TABS: 1000.0000 mg | ORAL_TABLET | Freq: Every day | ORAL | 1 refills | Status: AC

## 2022-02-06 MED ORDER — BASAGLAR KWIKPEN 100 UNIT/ML ~~LOC~~ SOPN: 30.0000 [IU] | PEN_INJECTOR | Freq: Every day | SUBCUTANEOUS | 11 refills | Status: AC

## 2022-02-06 MED ORDER — HYDRALAZINE HCL 100 MG PO TABS
100.0000 mg | ORAL_TABLET | Freq: Two times a day (BID) | ORAL | 1 refills | Status: AC
Start: 2022-02-06 — End: ?

## 2022-02-06 MED ORDER — METOPROLOL TARTRATE 50 MG PO TABS
50.0000 mg | ORAL_TABLET | Freq: Every day | ORAL | 1 refills | Status: AC
Start: 2022-02-06 — End: ?

## 2022-02-06 MED ORDER — HYDROXYZINE HCL 25 MG PO TABS
25.0000 mg | ORAL_TABLET | Freq: Three times a day (TID) | ORAL | 1 refills | Status: AC | PRN
Start: 2022-02-06 — End: ?

## 2022-02-06 MED ORDER — ATORVASTATIN CALCIUM 40 MG PO TABS: 40.0000 mg | ORAL_TABLET | Freq: Every day | ORAL | 1 refills | Status: AC

## 2022-02-06 MED ORDER — HYDROCHLOROTHIAZIDE 25 MG PO TABS: 25.0000 mg | ORAL_TABLET | Freq: Every day | ORAL | 1 refills | Status: AC

## 2022-02-06 MED ORDER — LEVETIRACETAM 500 MG PO TABS: 500.0000 mg | ORAL_TABLET | Freq: Two times a day (BID) | ORAL | 1 refills | Status: AC

## 2022-02-06 MED ORDER — LOSARTAN POTASSIUM 100 MG PO TABS: 100.0000 mg | ORAL_TABLET | Freq: Every day | ORAL | 1 refills | Status: AC

## 2022-02-06 NOTE — Plan of Care (Signed)
  Problem: Education: Goal: Knowledge of General Education information will improve Description: Including pain rating scale, medication(s)/side effects and non-pharmacologic comfort measures Outcome: Progressing   Problem: Health Behavior/Discharge Planning: Goal: Ability to manage health-related needs will improve Outcome: Progressing   Problem: Clinical Measurements: Goal: Ability to maintain clinical measurements within normal limits will improve Outcome: Progressing   Problem: Clinical Measurements: Goal: Ability to maintain clinical measurements within normal limits will improve Outcome: Progressing Goal: Will remain free from infection Outcome: Progressing Goal: Diagnostic test results will improve Outcome: Progressing Goal: Respiratory complications will improve Outcome: Progressing Goal: Cardiovascular complication will be avoided Outcome: Progressing   Problem: Safety: Goal: Ability to disclose and discuss suicidal ideas will improve Outcome: Progressing Goal: Ability to identify and utilize support systems that promote safety will improve Outcome: Progressing   Problem: Education: Goal: Utilization of techniques to improve thought processes will improve Outcome: Progressing Goal: Knowledge of the prescribed therapeutic regimen will improve Outcome: Progressing   Problem: Safety: Goal: Periods of time without injury will increase Outcome: Progressing

## 2022-02-06 NOTE — Progress Notes (Signed)
Patient is pleasant. Very active on the unit spending time in the dayroom. She denies si/hi/avh.  She is med compliant and took her medication without incident. Her blood pressure continues to get better. She reports being ready for discharge.  Will continue to offer support and encouragement. Encouraged her to seek staff with any concerns she may have.       C Butler-Nicholson, LPN

## 2022-02-06 NOTE — Progress Notes (Signed)
D: Pt alert and oriented. Pt denies experiencing any pain, SI/HI, or AVH at this time. Pt reports she will be able to keep herself safe when she returns home.    A: Pt received discharge and medication education/information. Pt belongings were returned and signed for at this time to include home medication supply, printed prescriptions, and discharge paperwork.   R: Pt verbalized understanding of discharge and medication education/information.  Pt escorted to medical mall front lobby by staff  where pt was picked up and transported home by [employer].

## 2022-02-06 NOTE — BHH Suicide Risk Assessment (Signed)
[facility] Discharge Suicide Risk Assessment   Principal Problem: Severe recurrent major depression without psychotic features [facility]) Discharge Diagnoses: Principal Problem:   Severe recurrent major depression without psychotic features ([place]) Active Problems:   Primary hypertension   Diabetes mellitus without complication ([place])   Adjustment disorder with depressed mood   Intentional overdose of gabapentin ([place])   Total Time spent with patient: 30 minutes  Musculoskeletal: Strength & Muscle Tone: within normal limits Gait & Station: normal Patient leans: N/A  Psychiatric Specialty Exam  Presentation  General Appearance: Appropriate for Environment  Eye Contact:Good  Speech:Clear and Coherent  Speech Volume:Normal  Handedness:Right   Mood and Affect  Mood:Depressed; Hopeless  Duration of Depression Symptoms: Greater than two weeks  Affect:Congruent   Thought Process  Thought Processes:Coherent  Descriptions of Associations:Intact  Orientation:Full (Time, Place and Person)  Thought Content:Logical  History of Schizophrenia/Schizoaffective disorder:No  Duration of Psychotic Symptoms:No data recorded Hallucinations:No data recorded Ideas of Reference:None  Suicidal Thoughts:No data recorded Homicidal Thoughts:No data recorded  Sensorium  Memory:Immediate Good; Remote Good; Recent Good  Judgment:Good  Insight:Good   Executive Functions  Concentration:Good  Attention Span:Good  [place]  Language:Good   Psychomotor Activity  Psychomotor Activity:No data recorded  Assets  Assets:Desire for Improvement; Leisure Time; Physical Health; Resilience; Social Support; Housing; Financial Resources/Insurance   Sleep  Sleep:No data recorded  Physical Exam: Physical Exam Vitals and nursing note reviewed.  Constitutional:      Appearance: Normal appearance.  HENT:     Head: Normocephalic and atraumatic.     Mouth/Throat:      Pharynx: Oropharynx is clear.  Eyes:     Pupils: Pupils are equal, round, and reactive to light.  Cardiovascular:     Rate and Rhythm: Normal rate and regular rhythm.  Pulmonary:     Effort: Pulmonary effort is normal.     Breath sounds: Normal breath sounds.  Abdominal:     General: Abdomen is flat.     Palpations: Abdomen is soft.  Musculoskeletal:        General: Normal range of motion.  Skin:    General: Skin is warm and dry.  Neurological:     General: No focal deficit present.     Mental Status: She is alert. Mental status is at baseline.  Psychiatric:        Attention and Perception: Attention normal.        Mood and Affect: Mood normal.        Speech: Speech normal.        Behavior: Behavior normal.        Thought Content: Thought content normal.        Cognition and Memory: Cognition normal.        Judgment: Judgment normal.    Review of Systems  Constitutional: Negative.   HENT: Negative.    Eyes: Negative.   Respiratory: Negative.    Cardiovascular: Negative.   Gastrointestinal: Negative.   Musculoskeletal: Negative.   Skin: Negative.   Neurological: Negative.   Psychiatric/Behavioral: Negative.     Blood pressure (!) 154/110, pulse 90, temperature 98.3 F (36.8 C), temperature source Oral, resp. rate 18, height '5\' 5"'$  (1.651 m), weight 85.7 kg, SpO2 98 %. Body mass index is 31.44 kg/m.  Mental Status Per Nursing Assessment::   On Admission:  Suicidal ideation indicated by patient  Demographic Factors:  Divorced or widowed and Living alone  Loss Factors: Loss of significant relationship and Financial problems/change in socioeconomic status  Historical Factors: NA  Risk Reduction Factors:   Positive social support  Continued Clinical Symptoms:  Depression:   Severe  Cognitive Features That Contribute To Risk:  None    Suicide Risk:  Minimal: No identifiable suicidal ideation.  Patients presenting with no risk factors but with morbid  ruminations; may be classified as minimal risk based on the severity of the depressive symptoms   Follow-up Information     [facility] Follow up.   Why: Appointment is scheduled for 10:00AM 02/10/2022.  There will be a $25 copay.  Please bring your ID, insurance card and SS card number. Contact information: [address] Kerry Webb [place], [place] [address] [phone]                Plan Of Care/Follow-up recommendations:  Patient denies suicidal ideation.  Reports that her mood has generally improved regularly especially over the last few days.  Shows good self-care and upbeat affect and reports more positive thoughts about the future.  Agrees to outpatient treatment and continued medication management.  Referred to outpatient treatment and the new community she will be living in  Kerry Berthold, MD 02/06/2022, 11:06 AM

## 2022-02-06 NOTE — Progress Notes (Signed)
Recreation Therapy Notes    Date: 02/06/2022  Time: 10:15 am    Location: Craft room   Behavioral response: N/A   Intervention Topic: Stress Management    Discussion/Intervention: Patient refused to attend group.   Clinical Observations/Feedback:  Patient refused to attend group.    Lason Eveland LRT/CTRS        Chaela Branscum 02/06/2022 1:27 PM

## 2022-02-06 NOTE — Progress Notes (Signed)
  [facility] Adult Case Management Discharge Plan :  Will you be returning to the same living situation after discharge:  Yes,  pt reports that she will return home to pack her things in preparation of moving in with her aunt. At discharge, do you have transportation home?: Yes,  pt reports that she will have transportation from family. Do you have the ability to pay for your medications: Yes,  BC[employer]  Release of information consent forms completed and in the chart;  Patient's signature needed at discharge.  Patient to Follow up at:  Follow-up Information     [facility] Follow up.   Why: Appointment is scheduled for 10:00AM 02/10/2022.  There will be a $25 copay.  Please bring your ID, insurance card and SS card number. Contact information: [address] [place], [place] [address] [phone]                Next level of care provider has access to [facility] and Suicide Prevention discussed: Yes,  SPE completed with the patient.  Patient declined collateral contact at this time.      Has patient been referred to the Quitline?: N/A patient is not a smoker  Patient has been referred for addiction treatment: [place], LCSW 02/06/2022, 9:30 AM

## 2022-02-06 NOTE — Discharge Summary (Signed)
Physician Discharge Summary Note  Patient:  Kerry Webb is an 56 y.o., female MRN:  [id number] DOB:  [birth year] Patient phone:  [phone] (home)  Patient address:   [place] [address],  Total Time spent with patient: 30 minutes  Date of Admission:  01/30/2022 Date of Discharge: 02/06/2022  Reason for Admission: Patient was admitted after suicide attempt continued severe depression related to financial stress isolation loss of home.  Principal Problem: Severe recurrent major depression without psychotic features [facility]) Discharge Diagnoses: Principal Problem:   Severe recurrent major depression without psychotic features ([place]) Active Problems:   Primary hypertension   Diabetes mellitus without complication ([place])   Adjustment disorder with depressed mood   Intentional overdose of gabapentin ([place])   Past Psychiatric History: Patient has a history of depression worsening over the past year or more.  Past Medical History:  Past Medical History:  Diagnosis Date   Diabetes mellitus without complication ([place])    GERD (gastroesophageal reflux disease)    Heart murmur 1967   Hypertension 1993   Lump or mass in breast    Left   Seizures ([place]) 1983   Ulcer 1985    Past Surgical History:  Procedure Laterality Date   ABDOMINAL HYSTERECTOMY  2004   APPENDECTOMY  2014   BREAST BIOPSY Right 01-23-14   benign   BREAST SURGERY Left 01/14/12   BREAST SURGERY Right 05-16-14   benign   CHOLECYSTECTOMY N/A 05/18/2019   Procedure: LAPAROSCOPIC CHOLECYSTECTOMY;  Surgeon: Benjamine Sprague, DO;  Location: [facility] ORS;  Service: General;  Laterality: N/A;   COLONOSCOPY  2013   Dr. Candace Cruise   Family History:  Family History  Problem Relation Age of Onset   Liver cancer Mother    Cancer Mother        liver   Thyroid disease Mother    Kidney disease Father    Alcohol abuse Father    Other Brother        auto accident   Diabetes Maternal Aunt    Breast cancer Maternal  Aunt    Cancer Maternal Grandmother        breast   Hypertension Paternal Grandmother    Kidney disease Paternal Grandfather    Hypertension Paternal Grandfather    Family Psychiatric  History: See previous Social History:  Social History   Substance and Sexual Activity  Alcohol Use Not Currently   Alcohol/week: 1.0 standard drink of alcohol   Types: 1 Glasses of wine per week   Comment: last use 03/2021     Social History   Substance and Sexual Activity  Drug Use Never    Social History   Socioeconomic History   Marital status: Married    Spouse name: Not on file   Number of children: Not on file   Years of education: Not on file   Highest education level: Not on file  Occupational History   Not on file  Tobacco Use   Smoking status: Never   Smokeless tobacco: Never  Vaping Use   Vaping Use: Never used  Substance and Sexual Activity   Alcohol use: Not Currently    Alcohol/week: 1.0 standard drink of alcohol    Types: 1 Glasses of wine per week    Comment: last use 03/2021   Drug use: Never   Sexual activity: Not on file  Other Topics Concern   Not on file  Social History Narrative   Not on file   Social Determinants of Health  Financial Resource Strain: Not on file  Food Insecurity: Food Insecurity Present (06/13/2021)   Hunger Vital Sign    Worried About Running Out of Food in the Last Year: Sometimes true    Ran Out of Food in the Last Year: Sometimes true  Transportation Needs: No Transportation Needs (06/13/2021)   PRAPARE - Hydrologist (Medical): No    Lack of Transportation (Non-Medical): No  Physical Activity: Not on file  Stress: Not on file  Social Connections: Not on file    Hospital Course: Patient admitted to psychiatric hospital.  Very depressed and negative with suicidal ideation.  Continued antidepressant medicine as well as medical treatment of hypertension and diabetes.  Patient remained depressed initially  and continued to have suicidal ideation despite some medication adjustment.  Medication was further adjusted by addition of Abilify 5 mg/day.  Over the last few days prior to discharge patient has reported significantly improved mood.  Has denied suicidal thoughts for several days.  Reports that mood is feeling much better.  Feeling more hopeful about the future.  Denies any thought of wishing to die.  Patient has attended groups and interacted appropriately.  She is agreeable to recommended outpatient treatment in [place] where she will be moving soon after discharge.  Prescriptions provided.  Physical Findings: AIMS:  , ,  ,  ,    CIWA:    COWS:     Musculoskeletal: Strength & Muscle Tone: within normal limits Gait & Station: normal Patient leans: N/A   Psychiatric Specialty Exam:  Presentation  General Appearance: Appropriate for Environment  Eye Contact:Good  Speech:Clear and Coherent  Speech Volume:Normal  Handedness:Right   Mood and Affect  Mood:Depressed; Hopeless  Affect:Congruent   Thought Process  Thought Processes:Coherent  Descriptions of Associations:Intact  Orientation:Full (Time, Place and Person)  Thought Content:Logical  History of Schizophrenia/Schizoaffective disorder:No  Duration of Psychotic Symptoms:No data recorded Hallucinations:No data recorded Ideas of Reference:None  Suicidal Thoughts:No data recorded Homicidal Thoughts:No data recorded  Sensorium  Memory:Immediate Good; Remote Good; Recent Good  Judgment:Good  Insight:Good   Executive Functions  Concentration:Good  Attention Span:Good  [place] of Knowledge:Good  Language:Good   Psychomotor Activity  Psychomotor Activity:No data recorded  Assets  Assets:Desire for Improvement; Leisure Time; Physical Health; Resilience; Social Support; Housing; Financial Resources/Insurance   Sleep  Sleep:No data recorded   Physical Exam: Physical Exam Vitals  and nursing note reviewed.  Constitutional:      Appearance: Normal appearance.  HENT:     Head: Normocephalic and atraumatic.     Mouth/Throat:     Pharynx: Oropharynx is clear.  Eyes:     Pupils: Pupils are equal, round, and reactive to light.  Cardiovascular:     Rate and Rhythm: Normal rate and regular rhythm.  Pulmonary:     Effort: Pulmonary effort is normal.     Breath sounds: Normal breath sounds.  Abdominal:     General: Abdomen is flat.     Palpations: Abdomen is soft.  Musculoskeletal:        General: Normal range of motion.  Skin:    General: Skin is warm and dry.  Neurological:     General: No focal deficit present.     Mental Status: She is alert. Mental status is at baseline.  Psychiatric:        Mood and Affect: Mood normal.        Thought Content: Thought content normal.    Review of Systems  Constitutional: Negative.   HENT: Negative.    Eyes: Negative.   Respiratory: Negative.    Cardiovascular: Negative.   Gastrointestinal: Negative.   Musculoskeletal: Negative.   Skin: Negative.   Neurological: Negative.   Psychiatric/Behavioral: Negative.     Blood pressure (!) 154/110, pulse 90, temperature 98.3 F (36.8 C), temperature source Oral, resp. rate 18, height _0  (1.651 m), weight 85.7 kg, SpO2 98 %. Body mass index is 31.44 kg/m.   Social History   Tobacco Use  Smoking Status Never  Smokeless Tobacco Never   Tobacco Cessation:  N/A, patient does not currently use tobacco products   Blood Alcohol level:  Lab Results  Component Value Date   ETH <10 92/33/0076    Metabolic Disorder Labs:  Lab Results  Component Value Date   HGBA1C 8.9 (A) 09/18/2021   MPG 214.47 04/29/2021   MPG 171.42 05/18/2019   No results found for: "PROLACTIN" Lab Results  Component Value Date   CHOL 256 (H) 06/19/2021   TRIG 325 (H) 06/19/2021   HDL 34 (L) 06/19/2021   CHOLHDL 7.5 (H) 06/19/2021   LDLCALC 160 (H) 06/19/2021    See Psychiatric  Specialty Exam and Suicide Risk Assessment completed by Attending Physician prior to discharge.  Discharge destination:  Home  Is patient on multiple antipsychotic therapies at discharge:  No   Has Patient had three or more failed trials of antipsychotic monotherapy by history:  No  Recommended Plan for Multiple Antipsychotic Therapies: NA  Discharge Instructions     Diet - low sodium heart healthy   Complete by: As directed    Increase activity slowly   Complete by: As directed       Allergies as of 02/06/2022       Reactions   Cashew Nut Oil Shortness Of Breath   Shellfish Allergy Shortness Of Breath   Asa [aspirin]    Intestinal bleeding        Medication List     STOP taking these medications    insulin glargine-yfgn 100 UNIT/ML injection Commonly known as: SEMGLEE Replaced by: Basaglar KwikPen 100 UNIT/ML   metformin 1000 MG (OSM) 24 hr tablet Commonly known as: FORTAMET Replaced by: metFORMIN 1000 MG tablet       TAKE these medications      Indication  ARIPiprazole 5 MG tablet Commonly known as: ABILIFY Take 1 tablet (5 mg total) by mouth daily. Start taking on: February 07, 2022  Indication: Major Depressive Disorder   atorvastatin 40 MG tablet Commonly known as: LIPITOR Take 1 tablet (40 mg total) by mouth once daily.    Basaglar KwikPen 100 UNIT/ML Inject 30 Units into the skin at bedtime. Replaces: insulin glargine-yfgn 100 UNIT/ML injection  Indication: Type 2 Diabetes, ins prefer Basaglar   buPROPion 300 MG 24 hr tablet Commonly known as: WELLBUTRIN XL Take 1 tablet (300 mg total) by mouth daily. What changed:  medication strength how much to take  Indication: Major Depressive Disorder   Comfort EZ Pen Needles 32G X 4 MM Misc Generic drug: Insulin Pen Needle Use as directed with insulin pen What changed: Another medication with the same name was added. Make sure you understand how and when to take each.  Indication: Diabetes    Unifine Pentips 32G X 4 MM Misc Generic drug: Insulin Pen Needle Use with Basaglar What changed: You were already taking a medication with the same name, and this prescription was added. Make sure you understand how and when to take  each.  Indication: Diabetes   dapagliflozin propanediol 10 MG Tabs tablet Commonly known as: FARXIGA Take 1 tablet (10 mg total) by mouth daily. What changed: when to take this  Indication: Type 2 Diabetes   gabapentin 300 MG capsule Commonly known as: NEURONTIN Take 1 capsule (300 mg total) by mouth once nightly at bedtime.    hydrALAZINE 100 MG tablet Commonly known as: APRESOLINE Take 1 tablet (100 mg total) by mouth 2 (two) times daily. What changed:  medication strength how much to take  Indication: High Blood Pressure Disorder   hydrochlorothiazide 25 MG tablet Commonly known as: HYDRODIURIL Take 1 tablet (25 mg total) by mouth daily. Start taking on: February 07, 2022  Indication: High Blood Pressure Disorder   hydrOXYzine 25 MG tablet Commonly known as: ATARAX Take 1 tablet (25 mg total) by mouth 3 (three) times daily as needed for anxiety (or sleep).  Indication: Feeling Anxious   levETIRAcetam 500 MG tablet Commonly known as: KEPPRA Take 1 tablet (500 mg total) by mouth 2 (two) times daily.  Indication: Seizure   losartan 100 MG tablet Commonly known as: COZAAR Take 1 tablet (100 mg total) by mouth once daily.    metFORMIN 1000 MG tablet Commonly known as: GLUCOPHAGE Take 1 tablet (1,000 mg total) by mouth daily with breakfast. Replaces: metformin 1000 MG (OSM) 24 hr tablet  Indication: Type 2 Diabetes   metoprolol tartrate 50 MG tablet Commonly known as: LOPRESSOR Take 1 tablet (50 mg total) by mouth daily.  Indication: High Blood Pressure Disorder   potassium chloride SA 20 MEQ tablet Commonly known as: KLOR-CON M Take 1 tablet (20 mEq total) by mouth daily. Start taking on: February 07, 2022  Indication: High Blood  Pressure Disorder, Low Amount of Potassium in the Blood   Rightest GL300 Lancets Misc USE AS DIRECTED TO CHECK BLOOD SUGAR UP TO 4 TIMES DAILY.  Indication: Diabetes   Rightest GM550 Blood Glucose w/Device Kit USE AS DIRECTED TO CHECK BLOOD SUGAR UP TO 4 TIMES DAILY.  Indication: Diabetes   Rightest GS550 Blood Glucose test strip Generic drug: glucose blood USE AS DIRECTED TO CHECK BLOOD SUGAR UP TO 4 TIMES DAILY.  Indication: Diabetes   sertraline 100 MG tablet Commonly known as: ZOLOFT Take 1 tablet (100 mg total) by mouth once daily.         Follow-up Information     [facility] Follow up.   Why: Appointment is scheduled for 10:00AM 02/10/2022.  There will be a $25 copay.  Please bring your ID, insurance card and SS card number. Contact information: [address] [place], [place] [address] [phone]                Follow-up recommendations: Supportive counseling and therapy and encouragement.  Reminder to get help immediately of suicidal thoughts recur.  Prescriptions provided.  Appointment recommendations made for follow-up treatment.  Discharged today.  Comments: See above  Signed: Alethia Berthold, MD 02/06/2022, 11:11 AM

## 2022-05-07 ENCOUNTER — Other Ambulatory Visit: Payer: Self-pay

## 2022-12-12 IMAGING — MR MR HEAD WO/W CM
13 of 15 series · 38 of 48 positions shown · IV contrast (7.5 ml Gadavist)
Comparison: CT head without contrast 04/30/2021. MR head 08/06/2010

CLINICAL DATA: Seizure, acute, history of trauma. Seizure
yesterday. Remote history of seizures.

EXAM:
MRI HEAD WITHOUT AND WITH CONTRAST
TECHNIQUE: Multiplanar, multiecho pulse sequences of the brain and surrounding
structures were obtained without and with intravenous contrast.
CONTRAST:  7.5mL GADAVIST GADOBUTROL 1 MMOL/ML IV SOLN

[Series 9: T1 · sagittal · 5.0mm · 0.62mm/px · 1 of 23 slices shown (1 of 2)]
[im 1/23]
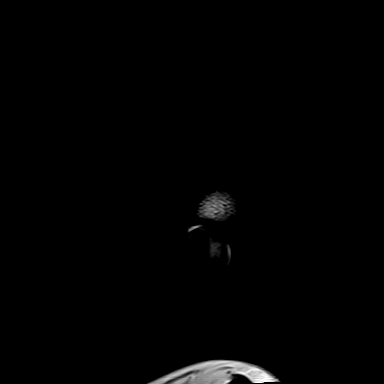

[Series 10: ax dwi_tracew · axial · 3.0mm · 0.65mm/px · z∈[-188,-34]mm · 2 of 48 slices shown]
[im 1/48]
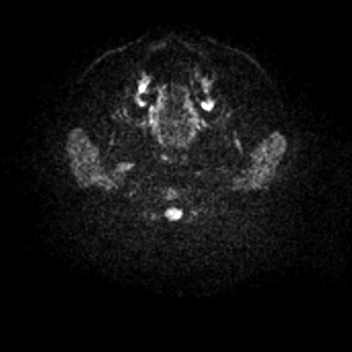
[im 48/48]
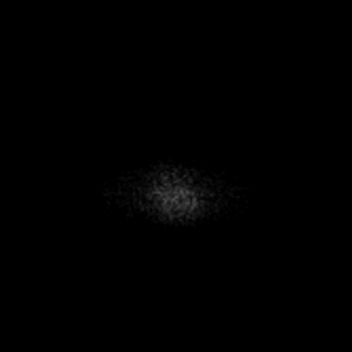

[Series 11: ax dwi_adc · axial · 3.0mm · 0.65mm/px · 1 of 45 slices shown]
[im 1/45]
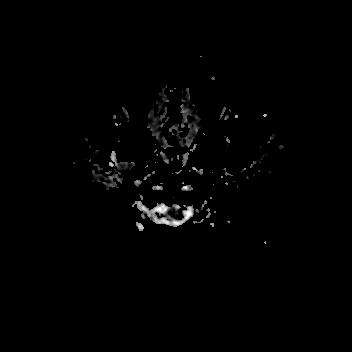

[Series 12: cor dwi_tracew · coronal · 5.0mm · 0.65mm/px · 2 of 38 slices shown]
[im 1/38]
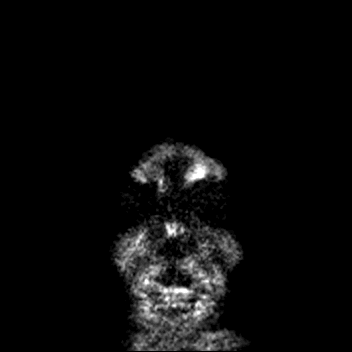
[im 38/38]
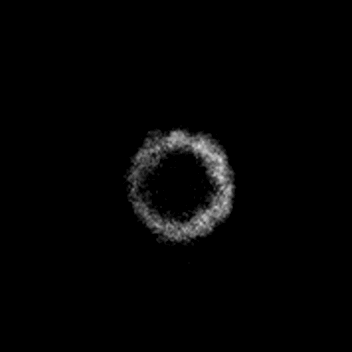

[Series 13: cor dwi_adc · coronal · 5.0mm · 0.65mm/px · 2 of 38 slices shown]
[im 1/38]
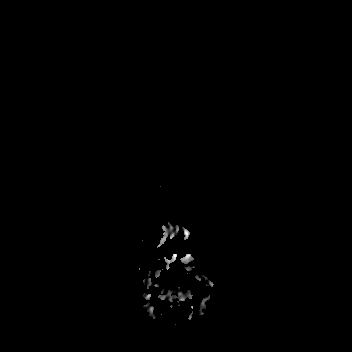
[im 38/38]
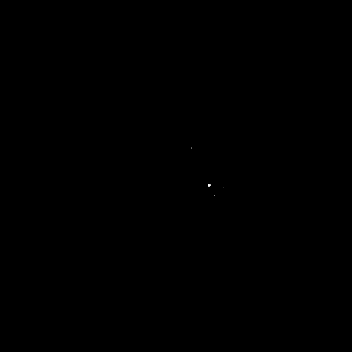

[Series 14: T2 · axial · 5.0mm · 0.53mm/px · 1 of 25 slices shown (1 of 2)]
[im 1/25]
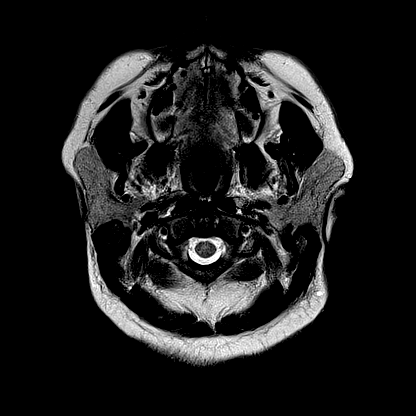

[Series 16: pha_images · axial · 3.0mm · 0.90mm/px · z∈[-187,-35]mm · 3 of 51 slices shown]
[im 1/51]
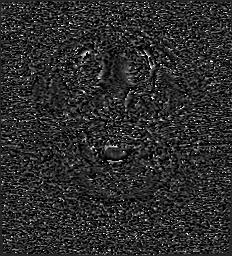
[im 26/51]
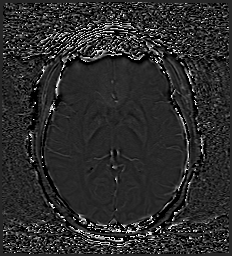
[im 51/51]
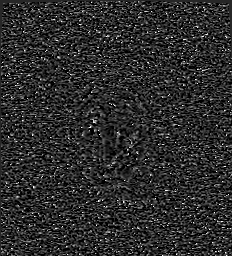

[Series 19: FLAIR · axial · 3.0mm · 0.53mm/px · z∈[-188,-33]mm · 3 of 53 slices shown (1 of 2)]
[im 1/53]
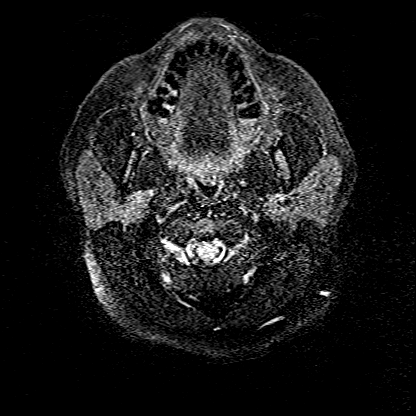
[im 27/53]
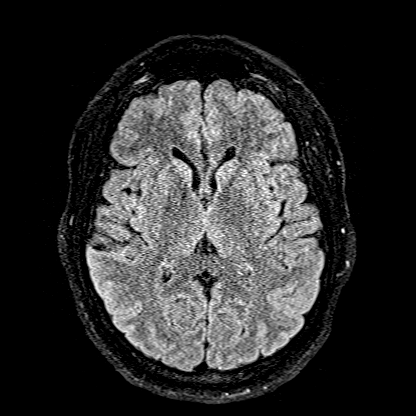
[im 53/53]
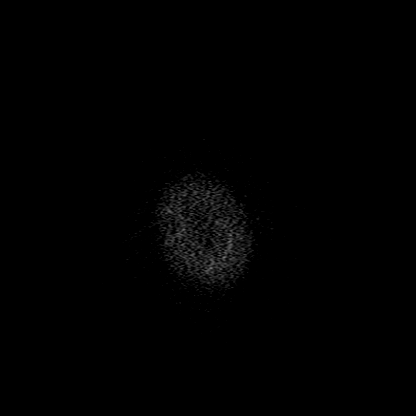

[Series 20: T1 · axial · 1.0mm · 0.98mm/px · z∈[-191,-34]mm · 8 of 160 slices shown (2 of 2)]
[im 1/160]
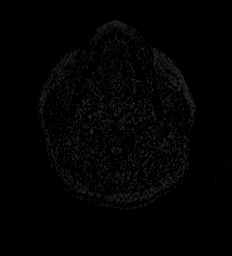
[im 20/160]
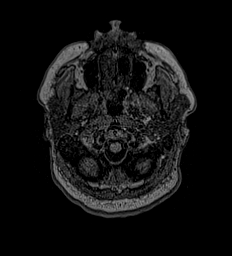
[im 40/160]
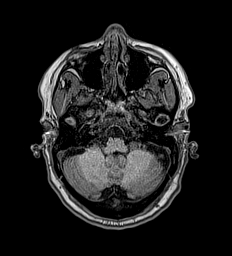
[im 60/160]
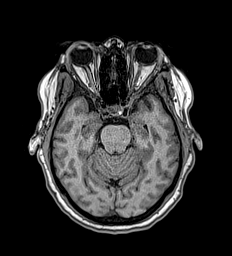
[im 100/160]
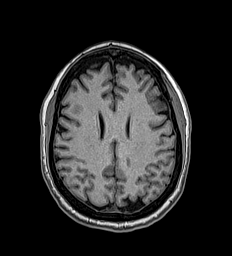
[im 120/160]
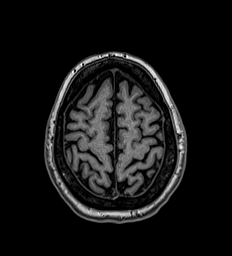
[im 140/160]
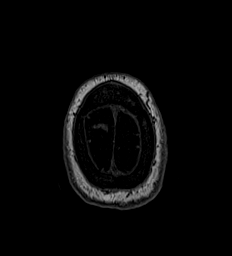
[im 160/160]
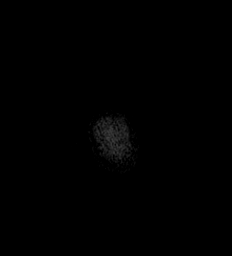

[Series 21: T2 · coronal · 3.0mm · 0.23mm/px · 2 of 32 slices shown (2 of 2)]
[im 1/32]
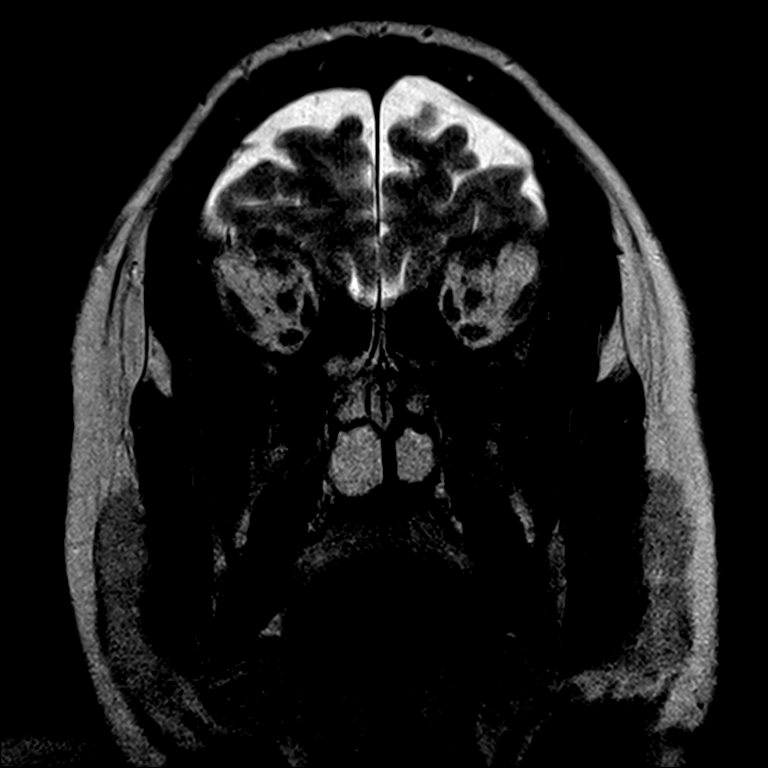
[im 32/32]
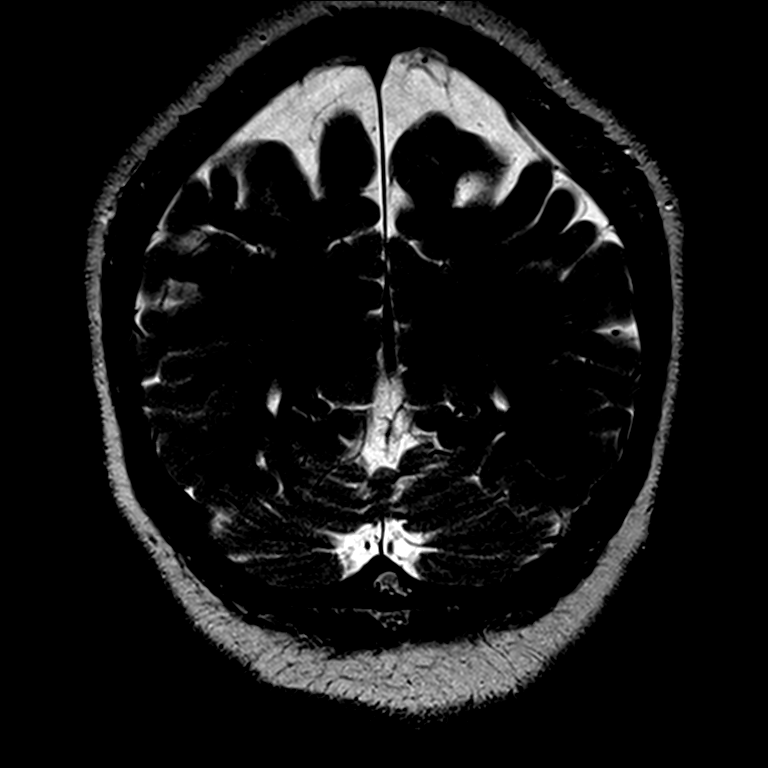

[Series 22: FLAIR · coronal · 3.0mm · 0.35mm/px · 2 of 32 slices shown (2 of 2)]
[im 1/32]
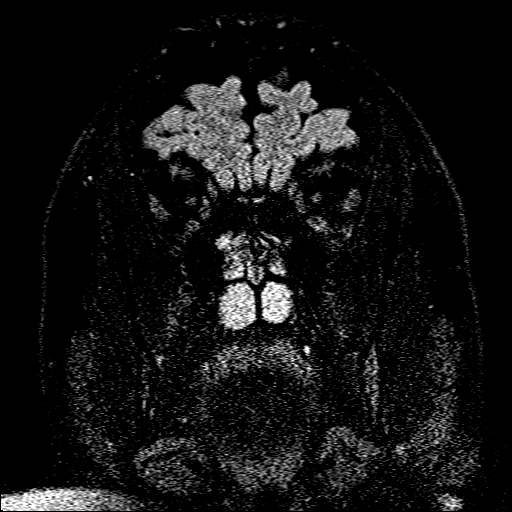
[im 32/32]
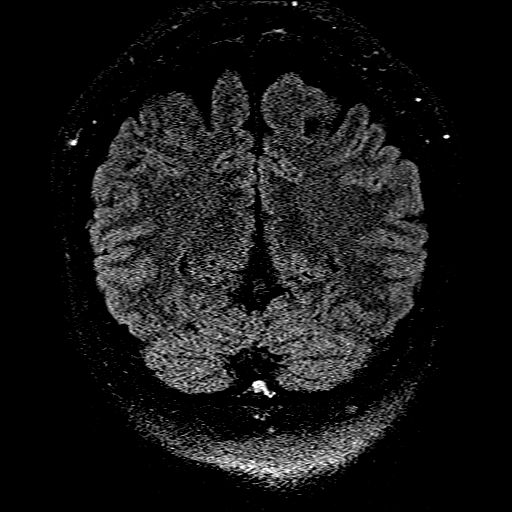

[Series 23: T1 post-contrast · axial · 1.0mm · 0.98mm/px · z∈[-191,-34]mm · 9 of 160 slices shown (1 of 2)]
[im 1/160]
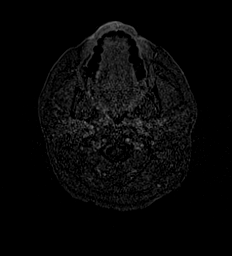
[im 20/160]
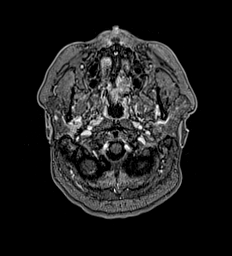
[im 40/160]
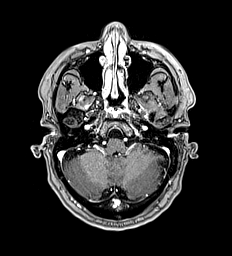
[im 60/160]
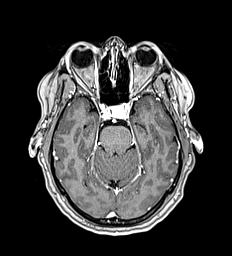
[im 80/160]
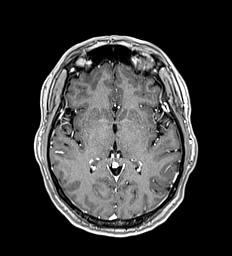
[im 100/160]
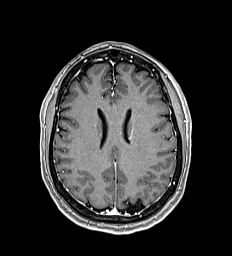
[im 120/160]
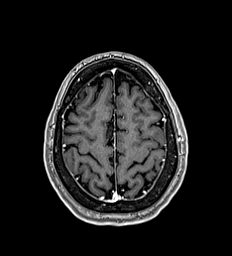
[im 140/160]
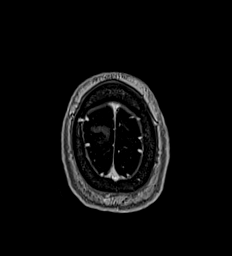
[im 160/160]
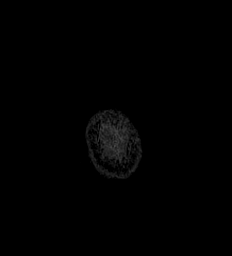

[Series 24: T1 post-contrast · coronal · 5.0mm · 0.57mm/px · 2 of 28 slices shown (2 of 2)]
[im 1/28]
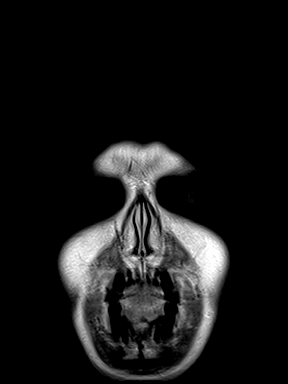
[im 28/28]
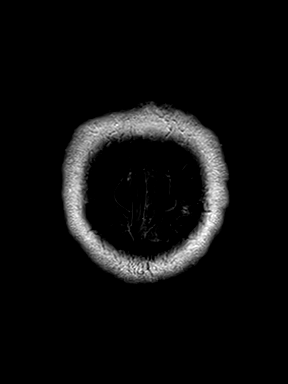

[38 of 48 positions shown; findings below may reference images not displayed]

FINDINGS: Brain: No acute infarct, hemorrhage, or mass lesion is present. No
significant white matter lesions are present. The ventricles are of
normal size. No significant extraaxial fluid collection is present.

The internal auditory canals are within normal limits. The brainstem
and cerebellum are within normal limits.

Dedicated imaging of the temporal lobes demonstrates symmetric size
and signal of the hippocampal structures.

Postcontrast images demonstrate no pathologic enhancement.

Vascular: Flow is present in the major intracranial arteries.

Skull and upper cervical spine: 10 mm left paramedian Tornwaldt cyst
demonstrates T1 shortening. Skull base is otherwise unremarkable.
Craniocervical junction is within normal limits.

Sinuses/Orbits: The paranasal sinuses and mastoid air cells are
clear. The globes and orbits are within normal limits.
IMPRESSION: Negative MRI of the brain without and with contrast. No acute or
focal lesion to explain seizures.

## 2023-02-04 ENCOUNTER — Telehealth: Payer: Self-pay

## 2023-02-04 NOTE — Telephone Encounter (Signed)
Opened in error
